# Patient Record
Sex: Male | Born: 1950 | Race: White | Hispanic: No | Marital: Married | Smoking: Never smoker
Health system: Southern US, Community
[De-identification: ages and names within clinical notes are randomized; demographics above are authoritative.]

## PROBLEM LIST (undated history)

## (undated) DIAGNOSIS — G43909 Migraine, unspecified, not intractable, without status migrainosus: Secondary | ICD-10-CM

## (undated) DIAGNOSIS — K226 Gastro-esophageal laceration-hemorrhage syndrome: Secondary | ICD-10-CM

## (undated) DIAGNOSIS — T7840XA Allergy, unspecified, initial encounter: Secondary | ICD-10-CM

## (undated) DIAGNOSIS — J309 Allergic rhinitis, unspecified: Secondary | ICD-10-CM

## (undated) DIAGNOSIS — IMO0001 Reserved for inherently not codable concepts without codable children: Secondary | ICD-10-CM

## (undated) DIAGNOSIS — M797 Fibromyalgia: Secondary | ICD-10-CM

## (undated) DIAGNOSIS — E785 Hyperlipidemia, unspecified: Secondary | ICD-10-CM

## (undated) DIAGNOSIS — G4700 Insomnia, unspecified: Secondary | ICD-10-CM

## (undated) DIAGNOSIS — M329 Systemic lupus erythematosus, unspecified: Secondary | ICD-10-CM

## (undated) DIAGNOSIS — R892 Abnormal level of other drugs, medicaments and biological substances in specimens from other organs, systems and tissues: Secondary | ICD-10-CM

## (undated) DIAGNOSIS — Z86711 Personal history of pulmonary embolism: Secondary | ICD-10-CM

## (undated) DIAGNOSIS — K279 Peptic ulcer, site unspecified, unspecified as acute or chronic, without hemorrhage or perforation: Secondary | ICD-10-CM

## (undated) DIAGNOSIS — M1990 Unspecified osteoarthritis, unspecified site: Secondary | ICD-10-CM

## (undated) DIAGNOSIS — M069 Rheumatoid arthritis, unspecified: Secondary | ICD-10-CM

## (undated) DIAGNOSIS — Z87448 Personal history of other diseases of urinary system: Secondary | ICD-10-CM

## (undated) DIAGNOSIS — E669 Obesity, unspecified: Secondary | ICD-10-CM

## (undated) DIAGNOSIS — D649 Anemia, unspecified: Secondary | ICD-10-CM

## (undated) DIAGNOSIS — I712 Thoracic aortic aneurysm, without rupture: Secondary | ICD-10-CM

## (undated) DIAGNOSIS — F329 Major depressive disorder, single episode, unspecified: Secondary | ICD-10-CM

## (undated) DIAGNOSIS — F411 Generalized anxiety disorder: Secondary | ICD-10-CM

## (undated) DIAGNOSIS — IMO0002 Reserved for concepts with insufficient information to code with codable children: Secondary | ICD-10-CM

## (undated) DIAGNOSIS — I214 Non-ST elevation (NSTEMI) myocardial infarction: Secondary | ICD-10-CM

## (undated) DIAGNOSIS — K219 Gastro-esophageal reflux disease without esophagitis: Secondary | ICD-10-CM

## (undated) DIAGNOSIS — J984 Other disorders of lung: Secondary | ICD-10-CM

## (undated) DIAGNOSIS — Z5189 Encounter for other specified aftercare: Secondary | ICD-10-CM

## (undated) DIAGNOSIS — H269 Unspecified cataract: Secondary | ICD-10-CM

## (undated) DIAGNOSIS — I25110 Atherosclerotic heart disease of native coronary artery with unstable angina pectoris: Secondary | ICD-10-CM

## (undated) DIAGNOSIS — Z955 Presence of coronary angioplasty implant and graft: Secondary | ICD-10-CM

## (undated) DIAGNOSIS — D689 Coagulation defect, unspecified: Secondary | ICD-10-CM

## (undated) DIAGNOSIS — I10 Essential (primary) hypertension: Secondary | ICD-10-CM

## (undated) HISTORY — DX: Other disorders of lung: J98.4

## (undated) HISTORY — DX: Allergy, unspecified, initial encounter: T78.40XA

## (undated) HISTORY — DX: Unspecified cataract: H26.9

## (undated) HISTORY — DX: Essential (primary) hypertension: I10

## (undated) HISTORY — DX: Generalized anxiety disorder: F41.1

## (undated) HISTORY — DX: Allergic rhinitis, unspecified: J30.9

## (undated) HISTORY — PX: UPPER GASTROINTESTINAL ENDOSCOPY: SHX188

## (undated) HISTORY — DX: Hyperlipidemia, unspecified: E78.5

## (undated) HISTORY — DX: Obesity, unspecified: E66.9

## (undated) HISTORY — DX: Major depressive disorder, single episode, unspecified: F32.9

## (undated) HISTORY — DX: Abnormal level of other drugs, medicaments and biological substances in specimens from other organs, systems and tissues: R89.2

## (undated) HISTORY — DX: Personal history of pulmonary embolism: Z86.711

## (undated) HISTORY — DX: Unspecified osteoarthritis, unspecified site: M19.90

## (undated) HISTORY — PX: APPENDECTOMY: SHX54

## (undated) HISTORY — DX: Gastro-esophageal laceration-hemorrhage syndrome: K22.6

## (undated) HISTORY — DX: Anemia, unspecified: D64.9

## (undated) HISTORY — DX: Coagulation defect, unspecified: D68.9

## (undated) HISTORY — DX: Encounter for other specified aftercare: Z51.89

## (undated) HISTORY — DX: Thoracic aortic aneurysm, without rupture: I71.2

## (undated) HISTORY — DX: Reserved for inherently not codable concepts without codable children: IMO0001

## (undated) HISTORY — DX: Personal history of other diseases of urinary system: Z87.448

## (undated) HISTORY — DX: Peptic ulcer, site unspecified, unspecified as acute or chronic, without hemorrhage or perforation: K27.9

## (undated) HISTORY — DX: Insomnia, unspecified: G47.00

## (undated) HISTORY — DX: Fibromyalgia: M79.7

---

## 2004-07-05 ENCOUNTER — Ambulatory Visit: Payer: Self-pay | Admitting: Internal Medicine

## 2004-08-03 ENCOUNTER — Ambulatory Visit: Payer: Self-pay | Admitting: Internal Medicine

## 2005-01-03 ENCOUNTER — Ambulatory Visit: Payer: Self-pay | Admitting: Internal Medicine

## 2005-03-26 ENCOUNTER — Ambulatory Visit: Payer: Self-pay | Admitting: Internal Medicine

## 2005-07-16 ENCOUNTER — Ambulatory Visit: Payer: Self-pay | Admitting: Internal Medicine

## 2005-07-23 ENCOUNTER — Ambulatory Visit: Payer: Self-pay | Admitting: Internal Medicine

## 2005-09-23 ENCOUNTER — Encounter: Admission: RE | Admit: 2005-09-23 | Discharge: 2005-12-22 | Payer: Self-pay | Admitting: Internal Medicine

## 2006-01-28 ENCOUNTER — Ambulatory Visit: Payer: Self-pay | Admitting: Internal Medicine

## 2006-04-22 ENCOUNTER — Ambulatory Visit: Payer: Self-pay | Admitting: Internal Medicine

## 2006-08-15 ENCOUNTER — Ambulatory Visit: Payer: Self-pay | Admitting: Internal Medicine

## 2006-08-15 LAB — CONVERTED CEMR LAB
AST: 43 units/L — ABNORMAL HIGH (ref 0–37)
Alkaline Phosphatase: 61 units/L (ref 39–117)
Basophils Absolute: 0 10*3/uL (ref 0.0–0.1)
Basophils Relative: 0.5 % (ref 0.0–1.0)
Bilirubin Urine: NEGATIVE
CO2: 27 meq/L (ref 19–32)
Cholesterol: 133 mg/dL (ref 0–200)
Creatinine, Ser: 0.9 mg/dL (ref 0.4–1.5)
Glucose, Bld: 95 mg/dL (ref 70–99)
HCT: 42.3 % (ref 39.0–52.0)
HDL: 34.5 mg/dL — ABNORMAL LOW (ref >39.0)
Ketones, ur: NEGATIVE mg/dL
LDL Cholesterol: 86 mg/dL (ref 0–99)
Lymphocytes Relative: 27.8 % (ref 12.0–46.0)
Neutrophils Relative %: 61.4 % (ref 43.0–77.0)
PSA: 0.64 ng/mL
PSA: 0.64 ng/mL (ref 0.10–4.00)
Platelets: 305 10*3/uL (ref 150–400)
Sodium: 141 meq/L (ref 135–145)
Specific Gravity, Urine: 1.02 (ref 1.000–1.03)
Total Protein, Urine: NEGATIVE mg/dL
Total Protein: 6.7 g/dL (ref 6.0–8.3)
Urine Glucose: NEGATIVE mg/dL
Urobilinogen, UA: 0.2 (ref 0.0–1.0)
WBC: 6.2 10*3/uL (ref 4.5–10.5)
pH: 6 (ref 5.0–8.0)

## 2006-08-18 ENCOUNTER — Encounter: Payer: Self-pay | Admitting: Internal Medicine

## 2006-08-18 LAB — CONVERTED CEMR LAB: PSA: 0.64 ng/mL

## 2006-08-19 ENCOUNTER — Ambulatory Visit: Payer: Self-pay | Admitting: Internal Medicine

## 2007-04-27 ENCOUNTER — Encounter: Payer: Self-pay | Admitting: Internal Medicine

## 2007-04-27 DIAGNOSIS — I10 Essential (primary) hypertension: Secondary | ICD-10-CM | POA: Insufficient documentation

## 2007-04-27 DIAGNOSIS — K226 Gastro-esophageal laceration-hemorrhage syndrome: Secondary | ICD-10-CM | POA: Insufficient documentation

## 2007-04-27 DIAGNOSIS — E785 Hyperlipidemia, unspecified: Secondary | ICD-10-CM

## 2007-04-27 HISTORY — DX: Gastro-esophageal laceration-hemorrhage syndrome: K22.6

## 2007-04-27 HISTORY — DX: Essential (primary) hypertension: I10

## 2007-04-27 HISTORY — DX: Hyperlipidemia, unspecified: E78.5

## 2007-04-30 ENCOUNTER — Encounter: Payer: Self-pay | Admitting: Internal Medicine

## 2007-04-30 DIAGNOSIS — J309 Allergic rhinitis, unspecified: Secondary | ICD-10-CM

## 2007-04-30 DIAGNOSIS — E669 Obesity, unspecified: Secondary | ICD-10-CM

## 2007-04-30 DIAGNOSIS — M1990 Unspecified osteoarthritis, unspecified site: Secondary | ICD-10-CM | POA: Insufficient documentation

## 2007-04-30 DIAGNOSIS — K219 Gastro-esophageal reflux disease without esophagitis: Secondary | ICD-10-CM | POA: Insufficient documentation

## 2007-04-30 DIAGNOSIS — Z87448 Personal history of other diseases of urinary system: Secondary | ICD-10-CM

## 2007-04-30 HISTORY — DX: Unspecified osteoarthritis, unspecified site: M19.90

## 2007-04-30 HISTORY — DX: Personal history of other diseases of urinary system: Z87.448

## 2007-04-30 HISTORY — DX: Allergic rhinitis, unspecified: J30.9

## 2007-04-30 HISTORY — DX: Obesity, unspecified: E66.9

## 2007-07-10 ENCOUNTER — Ambulatory Visit: Payer: Self-pay | Admitting: Internal Medicine

## 2007-07-12 DIAGNOSIS — K279 Peptic ulcer, site unspecified, unspecified as acute or chronic, without hemorrhage or perforation: Secondary | ICD-10-CM | POA: Insufficient documentation

## 2007-07-12 DIAGNOSIS — D649 Anemia, unspecified: Secondary | ICD-10-CM

## 2007-07-12 HISTORY — DX: Anemia, unspecified: D64.9

## 2007-07-12 HISTORY — DX: Peptic ulcer, site unspecified, unspecified as acute or chronic, without hemorrhage or perforation: K27.9

## 2007-10-12 ENCOUNTER — Encounter: Payer: Self-pay | Admitting: Internal Medicine

## 2007-10-14 ENCOUNTER — Encounter: Payer: Self-pay | Admitting: Internal Medicine

## 2007-11-14 ENCOUNTER — Ambulatory Visit: Payer: Self-pay | Admitting: Family Medicine

## 2007-11-14 DIAGNOSIS — H6690 Otitis media, unspecified, unspecified ear: Secondary | ICD-10-CM | POA: Insufficient documentation

## 2007-12-05 ENCOUNTER — Ambulatory Visit: Payer: Self-pay | Admitting: Internal Medicine

## 2007-12-05 DIAGNOSIS — M2550 Pain in unspecified joint: Secondary | ICD-10-CM | POA: Insufficient documentation

## 2007-12-07 ENCOUNTER — Ambulatory Visit: Payer: Self-pay | Admitting: Internal Medicine

## 2007-12-07 DIAGNOSIS — M130 Polyarthritis, unspecified: Secondary | ICD-10-CM | POA: Insufficient documentation

## 2007-12-07 LAB — CONVERTED CEMR LAB: Sed Rate: 70 mm/hr — ABNORMAL HIGH (ref 0–16)

## 2008-01-20 ENCOUNTER — Encounter: Payer: Self-pay | Admitting: Internal Medicine

## 2008-02-02 ENCOUNTER — Encounter: Payer: Self-pay | Admitting: Internal Medicine

## 2008-03-01 ENCOUNTER — Encounter: Payer: Self-pay | Admitting: Internal Medicine

## 2008-03-01 ENCOUNTER — Ambulatory Visit: Payer: Self-pay | Admitting: Internal Medicine

## 2008-03-01 ENCOUNTER — Inpatient Hospital Stay (HOSPITAL_COMMUNITY): Admission: EM | Admit: 2008-03-01 | Discharge: 2008-03-03 | Payer: Self-pay | Admitting: Emergency Medicine

## 2008-03-04 ENCOUNTER — Encounter: Payer: Self-pay | Admitting: Internal Medicine

## 2008-03-09 ENCOUNTER — Ambulatory Visit: Payer: Self-pay | Admitting: Internal Medicine

## 2008-03-09 DIAGNOSIS — I2699 Other pulmonary embolism without acute cor pulmonale: Secondary | ICD-10-CM | POA: Insufficient documentation

## 2008-03-09 DIAGNOSIS — Z86711 Personal history of pulmonary embolism: Secondary | ICD-10-CM

## 2008-03-09 HISTORY — DX: Personal history of pulmonary embolism: Z86.711

## 2008-03-12 ENCOUNTER — Telehealth (INDEPENDENT_AMBULATORY_CARE_PROVIDER_SITE_OTHER): Payer: Self-pay | Admitting: Acute Care

## 2008-03-14 ENCOUNTER — Ambulatory Visit: Payer: Self-pay

## 2008-03-14 ENCOUNTER — Ambulatory Visit: Payer: Self-pay | Admitting: Cardiovascular Disease

## 2008-03-14 ENCOUNTER — Encounter: Payer: Self-pay | Admitting: Cardiovascular Disease

## 2008-03-14 ENCOUNTER — Ambulatory Visit: Payer: Self-pay | Admitting: Critical Care Medicine

## 2008-03-14 ENCOUNTER — Ambulatory Visit: Payer: Self-pay | Admitting: Cardiology

## 2008-03-14 DIAGNOSIS — R892 Abnormal level of other drugs, medicaments and biological substances in specimens from other organs, systems and tissues: Secondary | ICD-10-CM | POA: Insufficient documentation

## 2008-03-14 DIAGNOSIS — R000 Tachycardia, unspecified: Secondary | ICD-10-CM | POA: Insufficient documentation

## 2008-03-14 HISTORY — DX: Abnormal level of other drugs, medicaments and biological substances in specimens from other organs, systems and tissues: R89.2

## 2008-03-23 ENCOUNTER — Ambulatory Visit: Payer: Self-pay

## 2008-03-23 ENCOUNTER — Encounter: Payer: Self-pay | Admitting: Cardiovascular Disease

## 2008-03-31 ENCOUNTER — Ambulatory Visit: Payer: Self-pay | Admitting: Cardiovascular Disease

## 2008-03-31 LAB — CONVERTED CEMR LAB
Basophils Absolute: 0 10*3/uL (ref 0.0–0.1)
Basophils Relative: 0.1 % (ref 0.0–3.0)
Eosinophils Relative: 0.6 % (ref 0.0–5.0)
Hemoglobin: 11.8 g/dL — ABNORMAL LOW (ref 13.0–17.0)
Lymphocytes Relative: 11.3 % — ABNORMAL LOW (ref 12.0–46.0)
MCHC: 33.9 g/dL (ref 30.0–36.0)
Monocytes Relative: 3.9 % (ref 3.0–12.0)
Neutro Abs: 8.1 10*3/uL — ABNORMAL HIGH (ref 1.4–7.7)
Neutrophils Relative %: 84.1 % — ABNORMAL HIGH (ref 43.0–77.0)
RBC: 4.13 M/uL — ABNORMAL LOW (ref 4.22–5.81)
WBC: 9.7 10*3/uL (ref 4.5–10.5)

## 2008-05-05 ENCOUNTER — Telehealth: Payer: Self-pay | Admitting: Internal Medicine

## 2008-05-25 ENCOUNTER — Ambulatory Visit: Payer: Self-pay | Admitting: Cardiology

## 2008-05-25 LAB — CONVERTED CEMR LAB
Calcium: 8.8 mg/dL (ref 8.4–10.5)
Eosinophils Absolute: 0 10*3/uL (ref 0.0–0.7)
GFR calc Af Amer: 129 mL/min
GFR calc non Af Amer: 107 mL/min
Glucose, Bld: 93 mg/dL (ref 70–99)
HCT: 33.8 % — ABNORMAL LOW (ref 39.0–52.0)
Hemoglobin: 11.4 g/dL — ABNORMAL LOW (ref 13.0–17.0)
MCHC: 33.8 g/dL (ref 30.0–36.0)
MCV: 81.5 fL (ref 78.0–100.0)
Monocytes Absolute: 0.1 10*3/uL (ref 0.1–1.0)
Monocytes Relative: 1.7 % — ABNORMAL LOW (ref 3.0–12.0)
Neutro Abs: 6 10*3/uL (ref 1.4–7.7)
Platelets: 450 10*3/uL — ABNORMAL HIGH (ref 150–400)
Potassium: 4.1 meq/L (ref 3.5–5.1)
Pro B Natriuretic peptide (BNP): 99 pg/mL (ref 0.0–100.0)
RDW: 14.2 % (ref 11.5–14.6)
Sodium: 139 meq/L (ref 135–145)

## 2008-05-26 ENCOUNTER — Ambulatory Visit: Payer: Self-pay | Admitting: Cardiology

## 2008-05-26 ENCOUNTER — Ambulatory Visit: Payer: Self-pay | Admitting: Cardiovascular Disease

## 2008-05-26 ENCOUNTER — Ambulatory Visit: Payer: Self-pay

## 2008-05-26 ENCOUNTER — Encounter: Payer: Self-pay | Admitting: Cardiology

## 2008-05-31 ENCOUNTER — Ambulatory Visit: Payer: Self-pay | Admitting: Cardiovascular Disease

## 2008-05-31 LAB — CONVERTED CEMR LAB
Basophils Absolute: 0 10*3/uL (ref 0.0–0.1)
Basophils Relative: 0.2 % (ref 0.0–3.0)
Calcium: 8.5 mg/dL (ref 8.4–10.5)
Creatinine, Ser: 0.8 mg/dL (ref 0.4–1.5)
Eosinophils Absolute: 0.1 10*3/uL (ref 0.0–0.7)
GFR calc Af Amer: 129 mL/min
GFR calc non Af Amer: 107 mL/min
Hemoglobin: 11.4 g/dL — ABNORMAL LOW (ref 13.0–17.0)
MCHC: 33.5 g/dL (ref 30.0–36.0)
MCV: 81.6 fL (ref 78.0–100.0)
Neutro Abs: 5.6 10*3/uL (ref 1.4–7.7)
RBC: 4.15 M/uL — ABNORMAL LOW (ref 4.22–5.81)
RDW: 14 % (ref 11.5–14.6)
Sodium: 138 meq/L (ref 135–145)

## 2008-06-05 HISTORY — PX: CARDIAC CATHETERIZATION: SHX172

## 2008-06-09 ENCOUNTER — Ambulatory Visit: Payer: Self-pay | Admitting: Cardiovascular Disease

## 2008-06-09 ENCOUNTER — Inpatient Hospital Stay (HOSPITAL_BASED_OUTPATIENT_CLINIC_OR_DEPARTMENT_OTHER): Admission: RE | Admit: 2008-06-09 | Discharge: 2008-06-09 | Payer: Self-pay | Admitting: Cardiovascular Disease

## 2008-06-14 ENCOUNTER — Telehealth (INDEPENDENT_AMBULATORY_CARE_PROVIDER_SITE_OTHER): Payer: Self-pay | Admitting: *Deleted

## 2008-06-15 ENCOUNTER — Telehealth (INDEPENDENT_AMBULATORY_CARE_PROVIDER_SITE_OTHER): Payer: Self-pay | Admitting: *Deleted

## 2008-06-20 ENCOUNTER — Ambulatory Visit: Payer: Self-pay | Admitting: Cardiology

## 2008-06-23 ENCOUNTER — Encounter: Payer: Self-pay | Admitting: Internal Medicine

## 2008-06-27 ENCOUNTER — Ambulatory Visit: Payer: Self-pay | Admitting: Internal Medicine

## 2008-07-04 ENCOUNTER — Ambulatory Visit: Payer: Self-pay | Admitting: Cardiovascular Disease

## 2008-07-04 LAB — CONVERTED CEMR LAB
ALT: 33 units/L (ref 0–53)
AST: 28 units/L (ref 0–37)
Alkaline Phosphatase: 49 units/L (ref 39–117)
Bilirubin, Direct: 0.1 mg/dL (ref 0.0–0.3)
Cholesterol: 95 mg/dL (ref 0–200)
Total Protein: 7.2 g/dL (ref 6.0–8.3)

## 2008-07-07 ENCOUNTER — Ambulatory Visit: Payer: Self-pay | Admitting: Cardiology

## 2008-07-27 ENCOUNTER — Ambulatory Visit: Payer: Self-pay | Admitting: Cardiovascular Disease

## 2008-08-02 ENCOUNTER — Encounter: Payer: Self-pay | Admitting: Internal Medicine

## 2008-08-05 HISTORY — PX: COLONOSCOPY: SHX174

## 2008-08-09 ENCOUNTER — Ambulatory Visit: Payer: Self-pay | Admitting: Internal Medicine

## 2008-08-09 DIAGNOSIS — R930 Abnormal findings on diagnostic imaging of skull and head, not elsewhere classified: Secondary | ICD-10-CM | POA: Insufficient documentation

## 2008-09-27 ENCOUNTER — Encounter: Payer: Self-pay | Admitting: Internal Medicine

## 2008-09-28 ENCOUNTER — Telehealth: Payer: Self-pay | Admitting: Internal Medicine

## 2008-09-28 DIAGNOSIS — R0602 Shortness of breath: Secondary | ICD-10-CM | POA: Insufficient documentation

## 2008-09-29 ENCOUNTER — Ambulatory Visit: Payer: Self-pay | Admitting: Internal Medicine

## 2009-01-04 ENCOUNTER — Encounter: Payer: Self-pay | Admitting: *Deleted

## 2009-01-05 ENCOUNTER — Telehealth: Payer: Self-pay | Admitting: Internal Medicine

## 2009-01-27 ENCOUNTER — Encounter: Payer: Self-pay | Admitting: Internal Medicine

## 2009-02-08 ENCOUNTER — Encounter: Payer: Self-pay | Admitting: *Deleted

## 2009-02-14 ENCOUNTER — Telehealth (INDEPENDENT_AMBULATORY_CARE_PROVIDER_SITE_OTHER): Payer: Self-pay | Admitting: *Deleted

## 2009-02-14 ENCOUNTER — Ambulatory Visit: Payer: Self-pay | Admitting: Internal Medicine

## 2009-02-14 DIAGNOSIS — F3289 Other specified depressive episodes: Secondary | ICD-10-CM

## 2009-02-14 DIAGNOSIS — F329 Major depressive disorder, single episode, unspecified: Secondary | ICD-10-CM | POA: Insufficient documentation

## 2009-02-14 DIAGNOSIS — M6080 Other myositis, unspecified site: Secondary | ICD-10-CM | POA: Insufficient documentation

## 2009-02-14 DIAGNOSIS — IMO0001 Reserved for inherently not codable concepts without codable children: Secondary | ICD-10-CM | POA: Insufficient documentation

## 2009-02-14 HISTORY — DX: Reserved for inherently not codable concepts without codable children: IMO0001

## 2009-02-14 HISTORY — DX: Major depressive disorder, single episode, unspecified: F32.9

## 2009-02-14 HISTORY — DX: Other specified depressive episodes: F32.89

## 2009-02-14 LAB — CONVERTED CEMR LAB
AST: 29 units/L (ref 0–37)
Albumin: 3.1 g/dL — ABNORMAL LOW (ref 3.5–5.2)
Alkaline Phosphatase: 46 units/L (ref 39–117)
BUN: 17 mg/dL (ref 6–23)
Basophils Relative: 0.2 % (ref 0.0–3.0)
Bilirubin Urine: NEGATIVE
Calcium: 8.7 mg/dL (ref 8.4–10.5)
Chloride: 104 meq/L (ref 96–112)
Cholesterol: 99 mg/dL (ref 0–200)
Creatinine, Ser: 0.7 mg/dL (ref 0.4–1.5)
Eosinophils Absolute: 0.1 10*3/uL (ref 0.0–0.7)
GFR calc non Af Amer: 124.02 mL/min (ref >60)
Hemoglobin, Urine: NEGATIVE
Hemoglobin: 13 g/dL (ref 13.0–17.0)
Ketones, ur: NEGATIVE mg/dL
LDL Cholesterol: 50 mg/dL (ref 0–99)
Lymphocytes Relative: 31.8 % (ref 12.0–46.0)
MCHC: 33.5 g/dL (ref 30.0–36.0)
Neutro Abs: 2.7 10*3/uL (ref 1.4–7.7)
RBC: 4.43 M/uL (ref 4.22–5.81)
Total Protein, Urine: NEGATIVE mg/dL
Total Protein: 6.7 g/dL (ref 6.0–8.3)
Urine Glucose: NEGATIVE mg/dL

## 2009-02-17 ENCOUNTER — Ambulatory Visit: Payer: Self-pay | Admitting: Internal Medicine

## 2009-02-17 DIAGNOSIS — F411 Generalized anxiety disorder: Secondary | ICD-10-CM | POA: Insufficient documentation

## 2009-02-17 DIAGNOSIS — M069 Rheumatoid arthritis, unspecified: Secondary | ICD-10-CM | POA: Insufficient documentation

## 2009-02-17 HISTORY — DX: Generalized anxiety disorder: F41.1

## 2009-02-28 ENCOUNTER — Telehealth (INDEPENDENT_AMBULATORY_CARE_PROVIDER_SITE_OTHER): Payer: Self-pay | Admitting: *Deleted

## 2009-03-17 ENCOUNTER — Encounter: Payer: Self-pay | Admitting: Cardiology

## 2009-03-20 ENCOUNTER — Encounter (INDEPENDENT_AMBULATORY_CARE_PROVIDER_SITE_OTHER): Payer: Self-pay | Admitting: *Deleted

## 2009-03-23 ENCOUNTER — Encounter: Payer: Self-pay | Admitting: Internal Medicine

## 2009-03-24 ENCOUNTER — Ambulatory Visit: Payer: Self-pay | Admitting: Internal Medicine

## 2009-04-14 ENCOUNTER — Ambulatory Visit: Payer: Self-pay | Admitting: Internal Medicine

## 2009-04-14 ENCOUNTER — Encounter: Payer: Self-pay | Admitting: Internal Medicine

## 2009-04-14 HISTORY — PX: POLYPECTOMY: SHX149

## 2009-04-17 ENCOUNTER — Encounter: Payer: Self-pay | Admitting: Internal Medicine

## 2009-05-30 ENCOUNTER — Telehealth: Payer: Self-pay | Admitting: Internal Medicine

## 2009-06-02 ENCOUNTER — Ambulatory Visit: Payer: Self-pay | Admitting: Internal Medicine

## 2009-06-02 DIAGNOSIS — R079 Chest pain, unspecified: Secondary | ICD-10-CM | POA: Insufficient documentation

## 2009-06-05 HISTORY — PX: NM MYOVIEW LTD: HXRAD82

## 2009-06-05 HISTORY — PX: TRANSTHORACIC ECHOCARDIOGRAM: SHX275

## 2009-06-12 ENCOUNTER — Telehealth (INDEPENDENT_AMBULATORY_CARE_PROVIDER_SITE_OTHER): Payer: Self-pay | Admitting: *Deleted

## 2009-06-13 ENCOUNTER — Encounter (HOSPITAL_COMMUNITY): Admission: RE | Admit: 2009-06-13 | Discharge: 2009-08-03 | Payer: Self-pay | Admitting: Internal Medicine

## 2009-06-13 ENCOUNTER — Encounter: Payer: Self-pay | Admitting: Internal Medicine

## 2009-06-13 ENCOUNTER — Ambulatory Visit: Payer: Self-pay

## 2009-06-13 ENCOUNTER — Ambulatory Visit: Payer: Self-pay | Admitting: Cardiology

## 2009-06-13 ENCOUNTER — Ambulatory Visit (HOSPITAL_COMMUNITY): Admission: RE | Admit: 2009-06-13 | Discharge: 2009-06-13 | Payer: Self-pay | Admitting: Internal Medicine

## 2009-06-13 DIAGNOSIS — R9439 Abnormal result of other cardiovascular function study: Secondary | ICD-10-CM | POA: Insufficient documentation

## 2009-06-14 ENCOUNTER — Telehealth (INDEPENDENT_AMBULATORY_CARE_PROVIDER_SITE_OTHER): Payer: Self-pay | Admitting: *Deleted

## 2009-06-15 ENCOUNTER — Ambulatory Visit: Payer: Self-pay | Admitting: Internal Medicine

## 2009-06-15 ENCOUNTER — Encounter: Payer: Self-pay | Admitting: Internal Medicine

## 2009-06-20 ENCOUNTER — Ambulatory Visit: Payer: Self-pay | Admitting: Critical Care Medicine

## 2009-06-20 DIAGNOSIS — J984 Other disorders of lung: Secondary | ICD-10-CM

## 2009-06-20 HISTORY — DX: Other disorders of lung: J98.4

## 2009-06-21 ENCOUNTER — Ambulatory Visit: Payer: Self-pay | Admitting: Cardiovascular Disease

## 2009-07-06 ENCOUNTER — Ambulatory Visit: Payer: Self-pay | Admitting: Cardiovascular Disease

## 2009-07-06 DIAGNOSIS — I712 Thoracic aortic aneurysm, without rupture, unspecified: Secondary | ICD-10-CM | POA: Insufficient documentation

## 2009-07-06 HISTORY — DX: Thoracic aortic aneurysm, without rupture, unspecified: I71.20

## 2009-07-06 HISTORY — DX: Thoracic aortic aneurysm, without rupture: I71.2

## 2010-02-20 ENCOUNTER — Ambulatory Visit: Payer: Self-pay | Admitting: Internal Medicine

## 2010-02-20 DIAGNOSIS — G4700 Insomnia, unspecified: Secondary | ICD-10-CM

## 2010-02-20 HISTORY — DX: Insomnia, unspecified: G47.00

## 2010-02-20 LAB — CONVERTED CEMR LAB
Albumin: 3.9 g/dL (ref 3.5–5.2)
Alkaline Phosphatase: 69 units/L (ref 39–117)
Basophils Absolute: 0.1 10*3/uL (ref 0.0–0.1)
Bilirubin, Direct: 0.1 mg/dL (ref 0.0–0.3)
CO2: 29 meq/L (ref 19–32)
Calcium: 9.3 mg/dL (ref 8.4–10.5)
Creatinine, Ser: 0.8 mg/dL (ref 0.4–1.5)
Eosinophils Absolute: 0.1 10*3/uL (ref 0.0–0.7)
Glucose, Bld: 93 mg/dL (ref 70–99)
HDL: 32.8 mg/dL — ABNORMAL LOW (ref >39.00)
Lymphocytes Relative: 18.3 % (ref 12.0–46.0)
MCHC: 33.3 g/dL (ref 30.0–36.0)
Neutro Abs: 6.9 10*3/uL (ref 1.4–7.7)
Neutrophils Relative %: 71.3 % (ref 43.0–77.0)
RDW: 14 % (ref 11.5–14.6)
Specific Gravity, Urine: >=1.03 (ref 1.000–1.030)
TSH: 0.77 microintl units/mL (ref 0.35–5.50)
Total Protein, Urine: NEGATIVE mg/dL
Triglycerides: 157 mg/dL — ABNORMAL HIGH (ref 0.0–149.0)
Urine Glucose: NEGATIVE mg/dL
Urobilinogen, UA: 1 (ref 0.0–1.0)
VLDL: 31.4 mg/dL (ref 0.0–40.0)

## 2010-06-19 ENCOUNTER — Encounter: Payer: Self-pay | Admitting: Internal Medicine

## 2010-09-02 LAB — CONVERTED CEMR LAB
ALT: 37 units/L (ref 0–53)
Albumin: 2.9 g/dL — ABNORMAL LOW (ref 3.5–5.2)
Basophils Relative: 0 % (ref 0.0–3.0)
Basophils Relative: 0.6 % (ref 0.0–1.0)
Bilirubin Urine: NEGATIVE
Bilirubin, Direct: 0.1 mg/dL (ref 0.0–0.3)
Bilirubin, Direct: 0.3 mg/dL (ref 0.0–0.3)
CO2: 27 meq/L (ref 19–32)
CO2: 28 meq/L (ref 19–32)
Calcium: 8.9 mg/dL (ref 8.4–10.5)
Creatinine, Ser: 0.9 mg/dL (ref 0.4–1.5)
Creatinine, Ser: 0.9 mg/dL (ref 0.4–1.5)
Eosinophils Relative: 2.5 % (ref 0.0–5.0)
GFR calc Af Amer: 113 mL/min
GFR calc Af Amer: 113 mL/min
Glucose, Bld: 108 mg/dL — ABNORMAL HIGH (ref 70–99)
Glucose, Bld: 119 mg/dL — ABNORMAL HIGH (ref 70–99)
HCT: 39.9 % (ref 39.0–52.0)
HCT: 42.9 % (ref 39.0–52.0)
Hemoglobin: 13.5 g/dL (ref 13.0–17.0)
Hemoglobin: 15.2 g/dL (ref 13.0–17.0)
Leukocytes, UA: NEGATIVE
Lymphocytes Relative: 28.3 % (ref 12.0–46.0)
MCHC: 33.8 g/dL (ref 30.0–36.0)
Monocytes Absolute: 0.3 10*3/uL (ref 0.1–1.0)
Monocytes Absolute: 0.7 10*3/uL (ref 0.2–0.7)
Monocytes Relative: 3.2 % (ref 3.0–12.0)
Monocytes Relative: 8.8 % (ref 3.0–11.0)
Neutro Abs: 4.7 10*3/uL (ref 1.4–7.7)
Neutro Abs: 7.3 10*3/uL (ref 1.4–7.7)
Neutrophils Relative %: 59.8 % (ref 43.0–77.0)
Potassium: 3.9 meq/L (ref 3.5–5.1)
RBC: 4.6 M/uL (ref 4.22–5.81)
RDW: 12.7 % (ref 11.5–14.6)
Sodium: 137 meq/L (ref 135–145)
Sodium: 139 meq/L (ref 135–145)
Specific Gravity, Urine: 1.025 (ref 1.000–1.03)
TSH: 1.68 microintl units/mL (ref 0.35–5.50)
Total Bilirubin: 0.6 mg/dL (ref 0.3–1.2)
Total Protein, Urine: NEGATIVE mg/dL
Total Protein: 6.9 g/dL (ref 6.0–8.3)
Total Protein: 7 g/dL (ref 6.0–8.3)
Triglycerides: 122 mg/dL (ref 0–149)
Urobilinogen, UA: 1 (ref 0.0–1.0)
WBC: 7.8 10*3/uL (ref 4.5–10.5)

## 2010-09-04 NOTE — Letter (Signed)
Summary: [facility]   Imported By: Sherian Rein 06/25/2010 11:14:20  _____________________________________________________________________  External Attachment:    Type:   Image     Comment:   External Document

## 2010-09-04 NOTE — Assessment & Plan Note (Signed)
Summary: PROBLEMS SLEEPING/NWS   Vital Signs:  Patient profile:   60 year old male Height:      72 inches Weight:      218.50 pounds BMI:     29.74 O2 Sat:      94 % on Room air Temp:     97.8 degrees F oral Pulse rate:   93 / minute BP sitting:   92 / 68  (left arm) Cuff size:   large  Vitals Entered By: Zella Ball Ewing CMA Duncan Dull) (February 20, 2010 3:23 PM)  O2 Flow:  Room air  CC: Sleeping and anxiety problems/RE   Primary Care Provider:  Oliver Barre  CC:  Sleeping and anxiety problems/RE.  History of Present Illness: here with 2 wks other unexplained anxiety increase with near panic and much more difficulty getting to sleep - only gets a few hours per night;  denies worsening depression or suicidal ideation;  RA doing better on current meds so pain is much better and not as much of an issue;  Gets diarrhea every 3 to 4 days, but no abd pain, n/v, wt loss, blood.  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  no other recent med changes.  Pt denies CP, sob, doe, wheezing, orthopnea, pnd, worsening LE edema, palps, dizziness or syncope  Pt denies new neuro symptoms such as headache, facial or extremity weakness Has klonopin more lately - takes about 35 to 45 min to start working.  had to open the windows going to work due to claustrophobia.    Preventive Screening-Counseling & Management      Drug Use:  no.    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) 14)  Anxiety  (ICD-300.00) 15)  Preventive Health Care  (ICD-V70.0) 16)  Depressive Disorder  (ICD-311) 17)  Unspecified Myalgia and Myositis  (ICD-729.1) 18)  Nonspecific Abnormal Toxicological Findings  (ICD-796.0) 19)  Polyarthritis, 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) 20)  Arthralgia  (ICD-719.40) 21)  Otitis Media, Left  (ICD-382.9) 22)  Preventive Health Care  (ICD-V70.0) 23)  Anemia-nos  (ICD-285.9) 24)  Family History Diabetes 1st Degree Relative  (ICD-V18.0) 25)  Family History of Alcoholism/addiction  (ICD-V61.41) 26)  Peptic Ulcer Disease  (ICD-533.90) 27)  Observation For Suspected Malignant Neoplasm  (ICD-V71.1) 28)  Routine General Medical Exam@health  Care Facl  (ICD-V70.0) 29)  Osteoarthritis  (ICD-715.90) 30)  Gerd  (ICD-530.81) 31)  Allergic Rhinitis  (ICD-477.9) 32)  Prostatitis, Hx of  (ICD-V13.09) 33)  Obesity  (ICD-278.00) 34)  Admsn For Blood Transfusion, No Diagnosis  (ICD-V58.2) 35)  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 Take 1 Tablet By Mouth At Bedtime As Needed Sleep 2)  Ecotrin Low Strength 81 Mg  Tbec (Aspirin) .Marland Kitchen.. 1 By Mouth Qd 3)  Medrol 4 Mg  Tabs (Methylprednisolone) .... 2 By Mouth Daily 4)  Omeprazole 20  Mg Cpdr (Omeprazole) .Marland Kitchen.. 1 By Mouth Once Daily 5)  Losartan Potassium 25 Mg Tabs (Losartan Potassium) .Marland Kitchen.. 1 By Mouth Once Daily 6)  Metoprolol Tartrate 25 Mg Tabs (Metoprolol Tartrate) .... 2 By Mouth Daily 7)  Leflunomide 20 Mg Tabs (Leflunomide) .... Take 1 Tablet By Mouth Once A Day 8)  Folic Acid 800 Mcg Tabs (Folic Acid) .... 2 Per Day 9)  Fish Oil 1200 Mg Caps (Omega-3 Fatty Acids) .Marland Kitchen.. 1 By Mouth Daily 10)  Osteo Bi-Flex Adv Triple St  Tabs (Misc Natural Products) .... 2 By Mouth Daily 11)  Klonopin 1 Mg Tabs (Clonazepam) .... One By Mouth Two Times A Day As Needed Anxiety -  Please  Make Return Office Visit For Further Refills 12)  Lyrica 75 Mg Caps (Pregabalin) .... One By Mouth Two Times A Day For Muscle Aches As Needed 13)  Furosemide 20 Mg Tabs (Furosemide) .Marland Kitchen.. 1 By Mouth Once Daily 14)  Mucinex Dm 30-600 Mg Xr12h-Tab (Dextromethorphan-Guaifenesin) .... As Needed 15)  Imodium A-D 2 Mg Tabs (Loperamide Hcl) .... As Needed 16)  Tussin Dm 100-10 Mg/62ml Syrp (Dextromethorphan-Guaifenesin) .... As Needed  Current Medications (verified): 1)  Zolpidem Tartrate 12.5 Mg Cr-Tabs (Zolpidem Tartrate) .Marland Kitchen.. 1po At Bedtime As Needed 2)  Ecotrin Low Strength 81 Mg  Tbec (Aspirin) .Marland Kitchen.. 1 By Mouth Qd 3)  Medrol 4 Mg  Tabs (Methylprednisolone) .... 2 By Mouth Daily 4)  Omeprazole 20 Mg Cpdr (Omeprazole) .Marland Kitchen.. 1 By Mouth Once Daily 5)  Losartan Potassium 25 Mg Tabs (Losartan Potassium) .Marland Kitchen.. 1 By Mouth Once Daily 6)  Metoprolol Tartrate 25 Mg Tabs (Metoprolol Tartrate) .... 2 By Mouth Daily 7)  Leflunomide 20 Mg Tabs (Leflunomide) .... Take 1 Tablet By Mouth Once A Day 8)  Folic Acid 800 Mcg Tabs (Folic Acid) .... 2 Per Day 9)  Fish Oil 1200 Mg Caps (Omega-3 Fatty Acids) .Marland Kitchen.. 1 By Mouth Daily 10)  Osteo Bi-Flex Adv Triple St  Tabs (Misc Natural Products) .... 2 By Mouth Daily 11)  Klonopin 1 Mg Tabs (Clonazepam) .... One By Mouth Two Times A Day As Needed Anxiety 12)  Lyrica 75 Mg Caps (Pregabalin) .... One By Mouth Two Times A Day For Muscle Aches As Needed 13)  Furosemide 20 Mg Tabs (Furosemide) .Marland Kitchen.. 1 By Mouth Once Daily 14)  Mucinex Dm 30-600 Mg Xr12h-Tab (Dextromethorphan-Guaifenesin) .... As Needed 15)  Imodium A-D 2 Mg Tabs (Loperamide Hcl) .... As Needed 16)  Tussin Dm 100-10 Mg/49ml Syrp (Dextromethorphan-Guaifenesin) .... As Needed 17)  Hydroxychloroquine Sulfate 200 Mg Tabs (Hydroxychloroquine Sulfate) .Marland Kitchen.. 1 By Mouth Two Times A Day 18)  Fluoxetine Hcl 20 Mg Caps (Fluoxetine Hcl) .Marland Kitchen.. 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 Drug use-no  Risk Factors: Alcohol Use: 0 (02/14/2009)  Risk Factors: Smoking Status: never (06/22/2009)  Past Medical History: MYOCARDIAL PERFUSION SCAN, WITH STRESS TEST, ABNORMAL (ICD-794.39) CHEST PAIN (ICD-786.50) PE (ICD-415.19) TACHYCARDIA (ICD-785.0) HYPERLIPIDEMIA (ICD-272.4) HYPERTENSION (ICD-401.9) ANTICOAGULATION THERAPY (ICD-V58.61) DYSPNEA (ICD-786.05) CHEST XRAY, ABNORMAL (ICD-793.1) RESTRICTIVE LUNG DISEASE (ICD-518.89) ARTHRITIS, RHEUMATOID (ICD-714.0) ANXIETY (ICD-300.00) PREVENTIVE HEALTH CARE (ICD-V70.0) DEPRESSIVE DISORDER (ICD-311) UNSPECIFIED MYALGIA AND MYOSITIS (ICD-729.1) NONSPECIFIC ABNORMAL TOXICOLOGICAL FINDINGS (ICD-796.0) POLYARTHRITIS, INFLAMMATORY (ICD-716.59) ARTHRALGIA (ICD-719.40)  OTITIS MEDIA, LEFT (ICD-382.9) PREVENTIVE HEALTH CARE (ICD-V70.0) ANEMIA-NOS (ICD-285.9) FAMILY HISTORY DIABETES 1ST DEGREE RELATIVE (ICD-V18.0) FAMILY HISTORY OF ALCOHOLISM/ADDICTION (ICD-V61.41) PEPTIC ULCER DISEASE (ICD-533.90) OBSERVATION FOR SUSPECTED MALIGNANT NEOPLASM (ICD-V71.1) ROUTINE GENERAL MEDICAL EXAM@HEALTH  CARE FACL (ICD-V70.0) OSTEOARTHRITIS (ICD-715.90) GERD (ICD-530.81) ALLERGIC RHINITIS (ICD-477.9) PROSTATITIS, HX OF (ICD-V13.09) OBESITY (ICD-278.00) ADMSN FOR BLOOD TRANSFUSION, NO DIAGNOSIS (ICD-V58.2) MALLORY-WEISS SYNDROME (ICD-530.7) Non-obstructive CAD   Anxiety  Social History: Reviewed history from 02/14/2009 and no changes required. 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         all otherwise negative per pt -    Physical Exam  General:  alert and overweight-appearing.   Head:  normocephalic and atraumatic.   Eyes:  vision grossly intact, pupils equal, and pupils round.   Ears:  R ear normal and L ear normal.   Nose:  no external deformity and no nasal discharge.   Mouth:  no gingival abnormalities and pharynx pink and moist.   Neck:  supple and no masses.   Lungs:  normal respiratory effort and normal breath sounds.   Heart:  normal rate and regular rhythm.   Abdomen:  soft, non-tender, and normal bowel sounds.   Msk:  no joint tenderness and no joint swelling.   Extremities:  no edema, no erythema  Neurologic:  cranial nerves II-XII intact and strength normal in all extremities.   Skin:  color normal and no rashes.   Psych:  not depressed appearing and moderately anxious.     Impression & Recommendations:  Problem # 1:  Preventive Health Care (ICD-V70.0)  Overall doing well, age appropriate education and counseling updated and referral for appropriate preventive services done unless declined, immunizations up to date or declined, diet counseling done if overweight, urged to quit smoking if smokes , most recent labs reviewed and current ordered if appropriate, ecg reviewed or declined (interpretation per ECG scanned in the EMR if done); information regarding Medicare Prevention requirements given if appropriate; speciality referrals updated as appropriate   Orders: TLB-BMP (Basic Metabolic Panel-BMET) (80048-METABOL) TLB-CBC Platelet -  w/Differential (85025-CBCD) TLB-Hepatic/Liver Function Pnl (80076-HEPATIC) TLB-Lipid Panel (80061-LIPID) TLB-TSH (Thyroid Stimulating Hormone) (84443-TSH) TLB-PSA (Prostate Specific Antigen) (84153-PSA) TLB-Udip ONLY (81003-UDIP)  Problem # 2:  ANXIETY (ICD-300.00)  His updated medication list for this problem includes:    Klonopin 1 Mg Tabs (Clonazepam) ..... One by mouth two times a day as needed anxiety    Fluoxetine Hcl 20 Mg Caps (Fluoxetine hcl) .Marland Kitchen... 1po once daily to refill the klonopin, add prozac 20- once daily , declines counseling  Problem # 3:  INSOMNIA UNSPECIFIED (ICD-780.52)  His updated medication list for this problem includes:    Zolpidem Tartrate 12.5 Mg Cr-tabs (Zolpidem tartrate) .Marland Kitchen... 1po at bedtime as needed change to longer acting generic zolpidem  Complete Medication List: 1)  Zolpidem Tartrate 12.5 Mg Cr-tabs (Zolpidem tartrate) .Marland Kitchen.. 1po at bedtime as needed 2)  Ecotrin Low Strength 81 Mg Tbec (Aspirin) .Marland Kitchen.. 1 by mouth qd 3)  Medrol 4 Mg Tabs (Methylprednisolone) .... 2 by mouth daily 4)  Omeprazole 20 Mg Cpdr (Omeprazole) .Marland Kitchen.. 1 by mouth once daily 5)  Losartan Potassium 25 Mg  Tabs (Losartan potassium) .Marland Kitchen.. 1 by mouth once daily 6)  Metoprolol Tartrate 25 Mg Tabs (Metoprolol tartrate) .... 2 by mouth daily 7)  Leflunomide 20 Mg Tabs (Leflunomide) .... Take 1 tablet by mouth once a day 8)  Folic Acid 800 Mcg Tabs (Folic acid) .... 2 per day 9)  Fish Oil 1200 Mg Caps (Omega-3 fatty acids) .Marland Kitchen.. 1 by mouth daily 10)  Osteo Bi-flex Adv Triple St Tabs (Misc natural products) .... 2 by mouth daily 11)  Klonopin 1 Mg Tabs (Clonazepam) .... One by mouth two times a day as needed anxiety 12)  Lyrica 75 Mg Caps (Pregabalin) .... One by mouth two times a day for muscle aches as needed 13)  Furosemide 20 Mg Tabs (Furosemide) .Marland Kitchen.. 1 by mouth once daily 14)  Mucinex Dm 30-600 Mg Xr12h-tab (Dextromethorphan-guaifenesin) .... As needed 15)  Imodium A-d 2 Mg Tabs  (Loperamide hcl) .... As needed 16)  Tussin Dm 100-10 Mg/45ml Syrp (Dextromethorphan-guaifenesin) .... As needed 17)  Hydroxychloroquine Sulfate 200 Mg Tabs (Hydroxychloroquine sulfate) .Marland Kitchen.. 1 by mouth two times a day 18)  Fluoxetine Hcl 20 Mg Caps (Fluoxetine hcl) .Marland Kitchen.. 1po once daily  Other Orders: TLB-Sedimentation Rate (ESR) (85652-ESR)  Patient Instructions: 1)  stop the regular ambien you have at home 2)  Please take all new medications as prescribed  - the longer acting generic ambien, the refill on the klonopin, and the generic prozac 3)  Please go to the Lab in the basement for your blood and/or urine tests today 4)  Please schedule a follow-up appointment in 1 year or sooner if needed Prescriptions: FLUOXETINE HCL 20 MG CAPS (FLUOXETINE HCL) 1po once daily  #90 x 3   Entered and Authorized by:   Corwin Levins MD   Signed by:   Corwin Levins MD on 02/20/2010   Method used:   Print then Give to Patient   RxID:   [id number] KLONOPIN 1 MG TABS (CLONAZEPAM) One by mouth two times a day as needed anxiety  #60 x 3   Entered and Authorized by:   Corwin Levins MD   Signed by:   Corwin Levins MD on 02/20/2010   Method used:   Print then Give to Patient   RxID:   [id number] ZOLPIDEM TARTRATE 12.5 MG CR-TABS (ZOLPIDEM TARTRATE) 1po at bedtime as needed  #30 x 5   Entered and Authorized by:   Corwin Levins MD   Signed by:   Corwin Levins MD on 02/20/2010   Method used:   Print then Give to Patient   RxID:   [id number]

## 2010-12-18 NOTE — Assessment & Plan Note (Signed)
[facility]                            CARDIOLOGY OFFICE NOTE   NAME:[name] Michael, [name] Michael                      MRN:          [id number]  DATE:03/14/2008                            DOB:          [birth year]    REASON FOR VISIT:  Chest pain, shortness of breath, and finding of  pericardial effusion on CT scan earlier today.   HISTORY OF PRESENT ILLNESS:  Mr. [name] Michael is a pleasant 60 year old  Caucasian male with past medical history significant for hypertension,  gastroesophageal reflux disease, prostatitis, diagnosis of pulmonary  embolism 2 weeks ago, and an unknown connective tissue disorder who is  seen as a consult today per Dr. Shan Levans from our pulmonary  division here in [facility].  Mr. [name] Michael was in his normal  state of good health prior to 2 weeks ago when he was found to be  tachycardic, had some mild dyspnea, and subsequently was found to have a  pulmonary embolism.  He was treated with anticoagulation, which included  a study drug rivaroxaban.  He has been anticoagulated on this therapy  for the last 2 weeks.  Since his discharge from the hospital 2 weeks  ago, he has done well.  He was in his normal state of good health 3 days  ago on Friday March 11, 2008.  When he went to bed, he began to have a  dry cough, only productive of a small amount of mucus.  On the following  day, he felt very fatigued and began to have some chest discomfort that  seemed to be positional in nature.  He states that the chest pain  worsened when he leaned forward.  He also noted some shortness of breath  at this time.  His temperature was mildly elevated at 100.5.  He  continued to have a dry cough, but states that his chest pain did  improve some yesterday.  He was seen earlier this morning by Dr. Delford Field  and was sent for a CT scan of the chest to follow up on the pulmonary  embolism.  CT scan showed a moderate-sized pericardial effusion.  The  patient was also found to be tachycardic, and because of these reasons,  was referred here today for further evaluation.  Today, he describes a  mild chest discomfort that worsens with leaning forward.  He also notes  a mild continued cough and some dyspnea with exertion.   PAST MEDICAL HISTORY:  Significant for hypertension, gastroesophageal  reflux disease, pulmonary embolism diagnosed 2 weeks ago, Mallory-Weiss  syndrome in 1987, prostatitis, osteoarthritis of the right knee, and a  connective tissue disorder that is per the patient, thought to be  rheumatoid arthritis.   PAST SURGICAL HISTORY:  Appendectomy as a child.   ALLERGIES:  PLAQUENIL, which causes nausea and headache.   MEDICATIONS:  1. Exforge 5/160 mg once daily.  2. Enteric-coated aspirin 81 mg once daily.  3. Medrol 4 mg 2 tablets daily.  4. Axid AR 75 mg 2 tablets once daily.  5. Rivaroxaban 15 mg 1 pill twice daily.  6. Ambien 10 mg at  night.  7. Zyrtec 10 mg once daily.   SOCIAL HISTORY:  The patient admits to social alcohol use, but denies  the use of tobacco or illicit drugs.  He is married and has 3 children.   FAMILY HISTORY:  The patient's father had a diagnosis of coronary  disease at the age of 34 and has undergone a coronary bypass surgery.  His mother is alive and well.  He has 1 brother with hypertension and  another brother with diabetes mellitus.   REVIEW OF SYSTEMS:  As stated in the history of present illness, is  otherwise negative.   PHYSICAL EXAMINATION:  GENERAL:  He is a pleasant, middle-aged Caucasian  male, in no acute distress.  VITAL SIGNS:  His blood pressure is 92/69, pulse is 117 and regular,  respirations are 12 and nonlabored.  NECK:  No JVD noted.  No carotid bruits noted.  SKIN:  Warm and dry.  LUNGS:  Clear to auscultation bilaterally without wheezes, rhonchi, or  crackles noted.  CARDIOVASCULAR:  Tachycardic with normal S1 and S2.  No murmurs,  gallops, or rubs are  noted.  ABDOMEN:  Soft and nontender.  Bowel sounds are present.  EXTREMITIES:  No evidence of lower extremity edema.  Pulses are 2+ in  all extremities.   DIAGNOSTIC STUDIES:  1. A 2-D echo performed in our office today shows that the left      ventricular systolic function is normal.  The ejection fraction is      estimated at 55%.  There is no diagnostic evidence of left      ventricular regional wall motion abnormalities.  Left ventricular      wall thickness was in the upper limits of normal.  The aortic valve      was normal in appearance.  There is mild aortic root dilatation.      The mitral valve is noted to be normal.  There is a small-to-      moderate sized circumferential pericardial effusion.  There are no      echocardiographic signs of cardiac tamponade.  2. A 12-lead electrocardiogram shows sinus tachycardia and is      otherwise a normal EKG.  The ventricular rate is 116 beats per      minute.  3. CT of the chest performed today shows a moderate-sized pericardial      effusion, as well as a mild stable fusiform enlargement of the      ascending aorta with a maximum enlargement of 4 cm.  There is no CT      evidence for pulmonary emboli.  There were small bilateral pleural      effusions and bibasilar atelectasis.  4. Laboratory values today show a white blood cell count of 8.8,      hemoglobin 13.5, hematocrit 39.9, and platelet count 462,000.      Potassium 4.7, creatinine 0.9, AST 36, ALT 37, alkaline phosphatase      36, and albumin 2.9.   ASSESSMENT AND PLAN:  This is a pleasant 60 year old Caucasian male with  a past medical history significant for hypertension and an unknown  connective tissue disorder who presents with small-to-moderate sized  pericardial effusion, signs and symptoms of pericarditis, but no signs  of cardiac tamponade.  1. Acute pericarditis.  This is most likely secondary to a viral      source since the patient had a viral prodrome over  the weekend.  He      describes  upper respiratory-type symptoms that most likely related      to this.  He does also have an undiagnosed connective tissue      disorder, which is felt to be somewhere on the spectrum between      lupus and rheumatoid arthritis.  This could certainly also be the      cause of his pericarditis with small pericardial effusion.  His      hemoglobin is stable, and because of this, I do not feel that this      is probably a hemorrhagic pericardial effusion.  He is relatively      asymptomatic other than some mild chest discomfort, which is      probably secondary to the pericardial inflammation/irritation.      While his EKG is concerning with tachycardia, it is most likely      related to the pericarditis.  I think it is reasonable to try a      trial of a nonsteroidal antiinflammatory drugs over the next week.      I have suggested that he take ibuprofen 600 mg 3 times daily.  He      is also already on Medrol 8 mg once daily.  We will have him return      to our office in 1 week for a followup echocardiogram.  I will plan      on seeing him back in our office 2 weeks from today.  The patient      is instructed to alert our office should he become more symptomatic      in the next week to include worsened shortness of breath, worsened      fatigue, fevers, or worsened chest pain.     Verne Carrow, MD  Electronically Signed    CM/MedQ  DD: 03/14/2008  DT: 03/15/2008  Job #: [id number]   cc:   Charlcie Cradle. Delford Field, MD, FCCP

## 2010-12-18 NOTE — Assessment & Plan Note (Signed)
[facility]                            CARDIOLOGY OFFICE NOTE   NAME:[name], Michael                      MRN:          [id number]  DATE:05/26/2008                            DOB:          19-Apr-1951    Michael Michael is a complicated 60 year old patient, who was added onto my  schedule by Dr. Juanda Michael.   The patient has initially been seen by Dr. Clifton Michael on March 31, 2008.  The patient has a history of pulmonary emboli, undifferentiated  connective tissue disease, and reflux.  The patient's PE occurred in  July.  He was seen by Dr. Danise Michael for treatment.  He has been  enrolled the the Reveal trial.  This is due to stop on May 31, 2008,  which is the end of next week.  I explained to the patient that I  thought he should be on a blood thinner for 6 months.  He will need to  transition over to Coumadin if his trial drug is ending.   The patient had a followup CT scan to assess his PE.  At that time, he  was noted to have a moderate-sized pericardial effusion.   He has been on higher-dose steroids in the past, but he is currently  only on 8 mg a day.   He has been having palpitations.  He had some chest pain and shortness  of breath.  He saw Dr. Juanda Michael in the office on May 25, 2008.  He had  a D-dimer of 10.41.   However, another followup CT scan showed no evidence of PE.  He had  bilateral pleural effusions and his previous pericardial effusion has  gone.   In talking to the patient, he is doing somewhat better.  His pain is  atypical.  It is sharp.  It is in the middle of his chest.  It can  radiate to the shoulders.  Both of his shoulders are significantly sore  from his arthritis.  He continues to have swelling in his hands from his  arthritis.  His breathing seems a bit better.  There is no PND,  orthopnea, no lower extremity edema.  His palpitations seem benign.  He  is not having prolonged episodes.  He feels occasional  flip-flops.   He was set up to have a 2-D echocardiogram today.  I have reviewed this  at length.  He has low-normal EF of 50-55%.  There is no abnormal septal  bounce.  Right-sided cardiac chambers were normal.  There is no  pericardial effusion.   He had trivial aortic insufficiency and trivial-to-mild MR.   I have reviewed his lab work.  His D-dimer was 10.41.  His BNP was only  99.  He had a white count of 4.5, hematocrit of 33.8.  Sed rate was up  to 57, creatinine was 0.8, potassium was 4.1.   CURRENT MEDICATIONS INCLUDE:  1. Rivaroxaban 20 mg a day.  2. Exforge 5/160.  3. Methylprednisolone 8 mg a day.  4. Zolpidem 10 mg a day.  5. Axid AR 75 a day.  6. Fish oil.  ALLERGIES:  No known allergies.   PHYSICAL EXAMINATION:  VITAL SIGNS:  Remarkable for a weight of 207,  blood pressure is 115/80, there is no pulse, and respiratory rate is 14.  HEENT:  Unremarkable.  JVP is not elevated.  There is normal regression  of JVD with inspiration and no Kussmaul sign.  LUNGS:  Decreased breath sounds bilaterally, right greater than left.  There is an S1 and S2 without rub.  PMI is normal.  ABDOMEN:  Benign.  Bowel sounds positive, AAA, no tenderness, no bruit,  no hepatosplenomegaly, no hepatojugular reflux, no tenderness.  EXTREMITIES:  Distal pulses intact.  No edema.  NEUROLOGIC:  Nonfocal.  SKIN:  Warm and dry.  No muscular weakness.  There is not much swelling  or signs of scleroderma around the fingertips.   IMPRESSION:  1. Chest pain, atypical, maybe related to pericardial irritation given      his previous pericardial effusion.  Echo currently not showing any      effusion, low likelihood of coronary artery disease.  2. 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  On exam, he continues to      have bilateral pleural effusions.  I suspect at some point, he      should be placed on a diuretic.  However, overall I think he should      be referred for right and left heart  catheterization to assess his      right-sided pressures and rule out any constrictive process.  Since      he is due to stop his study drug next week, this would be a good      time to transition him off a blood thinner, do his heart      catheterization, and then restart Coumadin.  3. Anticoagulation.  It is unclear to me who is calling the shots in      regards to his Coumadin.  Dr. Jonny Michael is the patient's primary MD.      Dr. Delford Michael saw him in the hospital for pulmonary embolism.  I      think, the recommendations despite his current CT not showing a      pulmonary embolism, would be for 6 months of anticoagulation.  If      the study drug is stopping in 3 months, he will need to be      transitioned to Coumadin.  I do not think he needs a heparin      overlap in between his catheterization.  4. 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      I think, the patient should be on a higher dose of steroids.      Apparently, he has been on Plaquenil in the past and not tolerated      it.  We will get an appointment for him to see Dr. Jimmy Michael to see      if he needs a higher-pulse dose of steroids to help.  I would think      that if his inflammation were under control, his sed rate would be      under 15.  5. The patient will have followup chest x-ray and labs      precatheterization during his visit to the [facility].  I will      try to arrange his heart catheterization with Dr. Clifton Michael, who is      his primary doctor in our group, and further  recommendations      regarding his heart will be given after his heart catheterization.      However, I think this is all a secondary pleuropericarditis from      his connective tissue disease, which currently is not being      adequately treated.  Incidentally, the patient's sister also has      significant connective tissue disease with a lupus-like  syndrome.     Noralyn Pick. Eden Emms, MD, [facility]  Electronically Signed    PCN/MedQ  DD: 05/26/2008  DT: 05/27/2008  Job #: [id number]

## 2010-12-18 NOTE — Discharge Summary (Signed)
NAME:  [name] Michael, [name] Michael             ACCOUNT NO.:  [id number]   MEDICAL RECORD NO.:  [id number]          PATIENT TYPE:  INP   LOCATION:  4728                         FACILITY:  [facility]   PHYSICIAN:  Raenette Rover. Felicity Coyer, MDDATE OF BIRTH:  1951-07-18   DATE OF ADMISSION:  03/01/2008  DATE OF DISCHARGE:  03/03/2008                               DISCHARGE SUMMARY   PRIMARY CARE PHYSICIAN:  Corwin Levins, MD   RHEUMATOLOGIST:  Lemmie Evens, MD   DISCHARGE DIAGNOSES:  1. Left lower lobe pulmonary embolism.  2. 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  3. Anemia of chronic disease.   HISTORY OF PRESENT ILLNESS:  Mr. Xia is a very pleasant 60 year old  white male with past medical history of hypertension as well as  undifferentiated tissue disorder on chronic steroid use currently being  evaluated by Dr. Jimmy Footman.  The patient presented to [facility]  Emergency Room on day of admission with reports of awaking that morning  with midsternal chest pain worse upon inspiration.  The patient  presented to previously scheduled appointment with his rheumatologist at  which time his heart rate was found to be in the 150s and the patient  transported to the ED for further evaluation and treatment.  Upon  evaluation in the ER, lab work revealed an elevated D-dimer at 2.64 at  which time a CT angio of the chest was performed revealing left lower  lobe pulmonary embolism.  The patient was admitted at that time for  further evaluation and treatment.   PAST MEDICAL HISTORY:  1. Hypertension.  2. Undifferentiated MCTD on chronic steroids.  3. Hyperlipidemia.  4. Mallory-Weiss syndrome.  5. Chronic anemia.  6. GERD.  7. Peptic ulcer disease.   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  Therefore, a PE may likely be secondary to this  prolonged  sitting, however, cannot rule out PE secondary to the  patient's rheumatological disorder.  A hypercoag panel obtained during  this admission was unremarkable.  The patient was started on Coumadin  with Lovenox bridge at the time of admission; however, the patient at  this time has decided to enroll in the [facility] Trial or he  will be receiving Rivaroxaban.  However, research associates have  reviewed trial as well as drug effects in depth with the patient.  The  patient at this time has signed consent.  Pharmacy was notified to  discontinue Lovenox and Coumadin.  The patient is to follow up with  clinical research coordinator for his followup visit.  The patient  without any shortness of breath or chest pain at this time.  His O2  saturation has remained stable throughout hospitalization.   MEDICATIONS AT THE TIME OF DISCHARGE:  1. Lotrel 5-20 mg p.o. daily.  2. Axid 75 mg two tabs p.o. daily.  3. Zyrtec 10 mg p.o. daily.  4. Zolpidem 10 mg p.o. at bedtime p.r.n.  5. Medrol 4 mg tabs 3 tabs p.o.  daily, unless otherwise instructed.  6. Meloxicam 7.5 mg tabs 1-2 tablets p.o. q.6 h. p.r.n.  7. Rivaroxaban as directed.   PERTINENT LABORATORY WORK DURING HOSPITALIZATION:  White cell count 9.6,  platelet count 319, hemoglobin 11.9, and hematocrit 36.8.  Sodium 132  and potassium 3.9.  BUN 11, creatinine 0.72.  Hypercoagulable panel  within normal limits.  Total cholesterol 131, triglycerides 53, HDL 31,  LDL 89, TSH 0.674.  Cardiac enzymes were negative x2.  INR at the time  of dictation 1.6.   DISPOSITION:  The patient felt medically stable for discharge home at  this time.  As mentioned above, he will follow up with research  coordinator in regards to PE treatment.  The patient is also scheduled  to follow up with his primary care physician, Dr. Oliver Barre, on  Wednesday March 09, 2008, at 2:00 p.m. at which time the patient may  need clearance to return back to  work.      Cordelia Pen, NP      Raenette Rover. Felicity Coyer, MD  Electronically Signed    LE/MEDQ  D:  03/03/2008  T:  03/03/2008  Job:  [id number]   cc:   Corwin Levins, MD

## 2010-12-18 NOTE — Cardiovascular Report (Signed)
NAME:  [name] Michael, [name] Michael NO.:  [id number]   MEDICAL RECORD NO.:  [id number]          PATIENT TYPE:  OIB   LOCATION:  1962                         FACILITY:  [facility]   PHYSICIAN:  Verne Carrow, MDDATE OF BIRTH:  12/09/1950   DATE OF PROCEDURE:  06/09/2008  DATE OF DISCHARGE:  06/09/2008                            CARDIAC CATHETERIZATION   INDICATIONS:  Chest pain and shortness of breath in a 60 year old  Caucasian male with a recent diagnosis of pericardial effusion.  The  test today is to rule out coronary artery disease as a cause of chest  pain as well as to rule out a constrictive physiology from the prior  pericarditis.   OPERATOR:  Verne Carrow, MD   PROCEDURES PERFORMED:  1. Left heart catheterization.  2. Selective coronary angiography.  3. Right heart catheterization.  4. Left ventricular angiogram.   Details of Procedure: The patient was brought into the outpatient  cardiac catheterization laboratory after signing informed consent. The  right groin was prepped and draped in a sterile fashion. A 4 french  sheath was placed in the the right femoral artery. A 6Fr sheath was  placed in the right femoral vein. A 4 Fr pigtail catheter was placed in  the LV. Right heart cath was performed with a multipurpose catheter with  simultaneous pressures measured in the LV while the RA, RV, PA and PCWP  pressures were obtained. Following the right heart cath, LV angiogram  was performed. Selective coronary angiography was performed with  standard catheters. The patient was taken to recovery in stable  condition.   FINDINGS:  Hemodynamic findings:  Central aortic pressure 107/73; left  ventricular pressure was 7/14; end-diastolic pressure 21; right atrial  pressure 10/8, with a mean of 7; right ventricular pressure 23/6, with a  mean of 3; pulmonary artery pressure 26/10, with a mean of 18; pulmonary  capillary wedge pressure with a mean of 12.  Cardiac  output 6.9.  Cardiac index 3.2.  Saturations, right atrial sat 76%, pulmonary artery  sat 70%, and left ventricular sat 95%.   ANGIOGRAPHIC FINDINGS:  1. Left main coronary artery bifurcates to the circumflex and the LAD,      and has no evidence of disease.  2. The left anterior descending is a large vessel that courses to the      apex and has mild plaque disease noted in the midportion of the      vessel.  3. The circumflex has plaque disease noted in the midportion.  There      is also an aneurysmal segment in the midportion.  There is a large      ramus intermedius branch that has no evidence of disease.  4. The right coronary artery is a 30% stenosis in the proximal      portion, 30% stenosis in the midportion, and serial 30-40% lesions      in the distal portion.  The right coronary artery is a dominant      vessel.  5. Left ventricular angiogram shows mild segmental systolic      dysfunction of the left ventricle.  There is hypokinesis of the      anterior and apical wall.  Ejection fraction is estimated at 40-      45%.   IMPRESSION:  1. Nonobstructive coronary artery disease.  2. Segmental left ventricular dysfunction.  3. No evidence of constrictive physiology.  4. Normal right-sided heart pressures.   RECOMMENDATIONS:  I will plan on starting the patient on aspirin, a  statin medication as well as a beta-blocker.  I think, he will benefit  from the beta-blocker both in regards of his coronary plaque disease as  well as his resting tachycardia.  I do not find any evidence of  constrictive pericarditis.  I will plan on seeing him back in my office  in 3 weeks to review his medications, to follow up his heart  catheterization, and also to adjust his medications accordingly.      Verne Carrow, MD  Electronically Signed     CM/MEDQ  D:  06/09/2008  T:  06/09/2008  Job:  [id number]

## 2010-12-18 NOTE — Assessment & Plan Note (Signed)
[facility]                                 ON-CALL NOTE   NAME:WILLIAMSMeric, Joye                      MRN:          [id number]  DATE:05/25/2008                            DOB:          24-Jan-1951    I received a call from [facility] this evening with regards to Marcille Buffy.  He has D-dimer drawn earlier today in the office and it came  back at 10.41.  Mr. Xia saw Dr. Juanda Chance in the office today  secondary to chest pain and dyspnea, and the D-dimer was evaluated  because he has a history of pulmonary embolism and is actually currently  being treated with rivaroxaban 20 mg daily, which is a study drug.  I  spoke with Mr. Vanhouten to discuss the D-dimer and also recommend that  he comes in to the ER for a CT of his chest this evening.  He has  indicated that he feels okay and that he has already been scheduled for  an echocardiogram tomorrow in the office as well as a CT angio of the  chest.  Again, I recommended that he come in this evening for the CT,  and he would prefer to wait until tomorrow.  He will, therefore, present  to the office tomorrow as scheduled for CT angio of the chest to rule  out PE.  He will continue his study drug.     Nicolasa Ducking, ANP  Electronically Signed    CB/MedQ  DD: 05/25/2008  DT: 05/26/2008  Job #: [id number]

## 2010-12-18 NOTE — Assessment & Plan Note (Signed)
[facility]                            CARDIOLOGY OFFICE NOTE   NAME:WILLIAMSEdiberto, Sens                      MRN:          [id number]  DATE:05/25/2008                            DOB:          04-05-1951    CLINICAL HISTORY:  Mr. Vallee is 60 years old and was hospitalized in  June 2009 with pulmonary embolus.  His symptoms at that time were  tachycardia and shortness of breath.  There was no predisposing cause.  He was subsequently put on ROCKET study drug.  He had a followup CT scan  because of recurrent tachycardia in August 20009, and this showed no  evidence of pulmonary emboli.  It did show a moderate effusion, and he  subsequently had an echo on March 23, 2008, which showed only a small  pericardial effusion.  He did pretty well until the past week when he  developed increasing symptoms of shortness of breath and chest pain.  He  describes the chest pain as a pain in the lower subxiphoid area which is  worse with deep breathing or if he exercises and breathes deeply.  He  also has gotten short of breath with minimal exertion.  He subsequently  saw Dr. Earney Hamburg on March 31, 2008, and he was doing somewhat  better, although he had persistent tachycardia.  He had no evidence of  jugular venous distention at that time.  He has also seen Dr. Jimmy Footman  and was told that he had an undiagnosed connective tissue disorder.  I  do not have the laboratory studies or documentation for that diagnosis.   PAST MEDICAL HISTORY:  Significant for the problems above.   CURRENT MEDICATIONS:  Include methylprednisolone 4 mg b.i.d., ROCKET  study drug, Ambien, Osteo Bi-Flex, fish oil and p.r.n. Motrin.     This became worse this morning, and he called and came in for  evaluation.   PHYSICAL EXAMINATION:  VITAL SIGNS:  Blood pressure is 120/78, pulse 125  and regular.  NECK:  Venous pulsations were visible 1 cm above the clavicle.  CHEST:  Decreased breath  sounds at both lung bases.  HEART:  The cardiac rhythm was regular but fast.  I could hear no  murmurs.  The first sound was increased, and the second sound was  borderline increased.  ABDOMEN:  Soft with normal bowel sounds.  There is no  hepatosplenomegaly.  EXTREMITIES:  No peripheral edema.  Pedal pulses are equal.   Electrocardiogram showed sinus tachycardia and was otherwise normal.  His chest x-ray showed borderline heart size.  Both diaphragms were  elevated, and the left hemidiaphragm was more elevated; there was  bilateral atelectasis.  This represented a change from the last chest x-  ray we had which was from June 2009.   IMPRESSION:  1. Shortness of breath and chest pain of uncertain etiology.  2. History of pulmonary embolism in June 2009 without predisposing      cause.  3. History of undiagnosed connective tissue disorder.  4. ROCKET study drug.   RECOMMENDATIONS:  I am uncertain regarding the etiology of Mr. Frei'  signs and symptoms.  I am concerned about the possibility of recurrent  pulmonary embolism, although the fact that he had a second CT scan which  was negative makes this less likely.  With his elevated neck veins, I am  concerned about hemodynamically significant pericardial effusion,  although his last echo showed only a small effusion.  I am also  concerned about the possibility of a immunologic disease in his lungs.  Since he is on a new study drug, we will have to keep in mind that there  is a remote possibility he could have had an allergic reaction to this  drug.  Will plan to get a CBC, BMP, BNP, sed rate and a D-dimer.  He  previously had a normal TSH.  We will plan to have him have a CT angio  with contrast tomorrow to rule out recurrent pulmonary emboli and will  have an echocardiogram to rule out hemodynamically significant  pericardial effusion and cardiomyopathy.  I will have him seen by either  Dr. Clifton [name] or doctor of the day tomorrow  after his studies.  If we do  not have an answer, we will need to get pulmonary and immunologic  consults to help [place] with the diagnosis.     Bruce Elvera Lennox Juanda Chance, MD, [facility]  Electronically Signed    BRB/MedQ  DD: 05/25/2008  DT: 05/25/2008  Job #: [id number]   cc:   Corwin Levins, MD  Charlcie Cradle Delford Field, MD, FCCP  Lemmie Evens, M.D.

## 2010-12-18 NOTE — Assessment & Plan Note (Signed)
[facility]                            CARDIOLOGY OFFICE NOTE   NAME:[name] Michael, Ponder                      MRN:          [id number]  DATE:07/04/2008                            DOB:          [birth year]    PRIMARY CARE PHYSICIAN:  Corwin Levins, MD   HISTORY OF PRESENT ILLNESS:  Mr. Pellerito is a pleasant 60 year old  Caucasian male with a past medical history significant for hypertension,  gastroesophageal reflux disease, prostatitis, and an unknown connective  tissue disorder as well as a diagnosis of pulmonary embolism in July  2009, who was initially seen in our Cardiology office on March 31, 2008.  The patient had been followed by Dr. Shan Levans of the  Pulmonary division for his pulmonary embolism and was on a study drug  Rivaroxaban for anticoagulation.  Followup CT of the chest on March 31, 2008, showed no evidence of pulmonary embolism.  However, there was a  moderate-sized pericardial effusion noted.  The patient was tachycardic  and because of this he was sent to our Cardiology office.  We elected to  follow this at that time and started a nonsteroidal anti-inflammatory  drug.  Followup echocardiogram showed that the pericardial effusion had  resolved.  The patient continued to be tachycardic.  He had no evidence  of recurrence of his pulmonary embolism.  However, secondary complaints  of chest pain and shortness of breath, returned to our Cardiology office  on May 25, 2008, and was seen by Dr. Charlies Constable.  The patient's D-  dimer was elevated at 10.0.  CT of the chest was then ordered and showed  no evidence of recurrent PE, however, there were bilateral pleural  effusions noted.  Followup in our office on May 26, 2008, with Dr.  Maurine Cane, at which time the patient was scheduled for a left and  right heart catheterization which was performed by myself on June 09, 2008.  The catheterization was ordered to rule out  coronary artery  disease as well as to rule out possible constrictive physiology to go  along with constrictive pericarditis.  The heart catheterization  demonstrated mild nonobstructive coronary artery disease and no evidence  of constrictive physiology on the right heart catheterization.  This  will be outlined in detail below.  Because of his mild non-obstructive  coronary artery disease as well as his resting tachycardia, I elected to  start him on a beta-blocker therapy.  I also started him on aspirin and  a statin medication.  The patient returns today to follow up after his  heart catheterization.  He tells me that he has been seen by Dr.  Jimmy Footman and was started on methotrexate therapy.  He does not seem to  be tolerating this therapy well as he reports increased fatigue as well  as some nausea and headaches.  He continues to note mild baseline  shortness of breath, but denies any chest pain.  He tells me that his  heart rate has been in the low 100s at home over the last several days.  His heart rate  had been well controlled in the 70s and 80s over the last  few weeks after starting beta-blocker therapy.  He denies any dizziness,  near syncope, syncope, or lower extremity edema.   PAST MEDICAL HISTORY:  Unchanged and is outlined above.   CURRENT MEDICATIONS:  1. Ambien 10 mg once daily.  2. Azid extended-release 75 mg once daily.  3. Osteo Bi-Flex twice daily.  4. Fish oil 1200 mg once daily.  5. Lipitor 20 mg once daily.  6. Enteric-coated aspirin 81 mg once daily.  7. Coumadin as directed.  8. Methylprednisolone 8 mg once daily.  9. Methotrexate 100 mg once weekly.  10.Folic acid 2 mg once daily.  11.Lopressor 25 mg twice daily.   REVIEW OF SYSTEMS:  As outlined above and is otherwise negative.   PHYSICAL EXAMINATION:  VITAL SIGNS:  Blood pressure 90/60, pulse 60 and  regular, respirations 12 and nonlabored.  GENERAL:  He is a pleasant, middle-aged Caucasian male in  no acute  distress.  He is alert and oriented x3.  PSYCHIATRIC:  Mood and affect are normal.  MUSCULOSKELETAL:  Muscular strength and tone is normal.  NEUROLOGICAL:  No focal neurological deficits.  SKIN:  Warm and dry.  HEENT:  Normal.  NECK:  No JVD.  No carotid bruits.  No thyromegaly.  No lymphadenopathy.  LUNGS:  There are mild bibasilar crackles with otherwise clear lung  fields.  There is good air movement bilaterally.  CARDIOVASCULAR:  Regular rate and rhythm without murmurs, gallops, or  rubs noted.  ABDOMEN:  Soft and nontender.  Bowel sounds are present.  EXTREMITIES:  No evidence of lower extremity edema.  Pulses are 2+ in  all extremities.   DIAGNOSTIC STUDIES:  1. Left and right heart catheterization performed on June 09, 2008,      with findings as follows.  The right heart catheterization      demonstrated a right atrial pressure of 7, right ventricular      pressure 23/6 with a mean of 3, pulmonary artery pressure 26/10      with a mean of 18, pulmonary capillary wedge pressure with a mean      of 12.  Cardiac output was 6.9.  Cardiac index was 3.2.  Left heart      catheterization with selective coronary angiography demonstrated no      evidence of disease in the left main coronary artery.  The left      anterior descending large vessel that coursed to the apex and had      mild plaque disease noted in the midportion of the vessel.      Circumflex artery had plaque noted in the midportion.  There was a      large ramus intermedius branch with no evidence of disease.  The      right coronary artery had serial 30% lesions in the proximal mid      and distal portions.  The right coronary artery was a dominant      vessel.  Left ventricular angiogram showed mild segmental systolic      dysfunction of the left ventricle with subtle hypokinesis of the      anteroapical wall.  Ejection fraction was estimated at 45%-50%.  2. Echocardiogram dated May 26, 2008, shows  hypokinesis in the mid      lateral wall and hypokinesis in the inferoposterior base.  Ejection      fraction was estimated at 55%.  Left ventricular wall thickness was  moderately increased.  There was mild aortic valvular regurgitation      noted.  3. CT angiogram performed on May 26, 2008, was negative for      pulmonary embolism or the pericardial effusion that was noted on      the prior study.  There was small to moderate bilateral pleural      effusions noted.  There was trace perisplenic ascites noted.   ASSESSMENT AND PLAN:  This is a complicated 60 year old Caucasian male  with recent history of pulmonary embolism, pericardial effusion, and  pleural effusions, undiagnosed connective tissue disorder, currently on  prednisone and methotrexate therapy under the guidance of Dr. Jimmy Footman  as well as a new diagnosis of nonobstructive coronary artery disease  with resting tachycardia, who presents today for followup.  It is  difficult at this time to explain all of his different complaints based  on one finding.  It is most likely that his atypical chest pain is  related to some pericardial irritation from prior pericardial effusion  as well as pleural irritation from his pleural effusions.  I am not sure  currently what is the etiology of his pleural effusions, but it is most  likely secondary to his undiagnosed connective tissue disease.  His  right heart catheterization was not suggestive of a constrictive  physiology.  He continues to have some resting tachycardia which I  cannot explain at this time.  I started him on an aspirin, a statin, and  a beta-blocker for his nonobstructive coronary artery disease and hopes  that this would also help with his resting tachycardia.  In regards to  his recent pulmonary embolism, the patient completed the trial drug and  was started on Coumadin for anticoagulation.  This has been followed in  Dr. Raphael Gibney office.  The patient tells me  that he is scheduled to see Dr.  Jimmy Footman in followup for his connective tissue disorder within the next  several weeks.  We will check liver function tests as well as fasting  lipid profile today and we will also get a chest x-ray to look at the  size of his pleural effusions currently.  I will not make any medication  changes at the current time.  I think that the etiology of most of his  symptoms ties into his undiagnosed connective tissue disorder.  I would  like to see him back in our office in 6 months.  The patient will follow  up closely with his primary care physician Dr. Jonny Ruiz as well as his  rheumatologist Dr. Jimmy Footman.     Verne Carrow, MD  Electronically Signed    CM/MedQ  DD: 07/04/2008  DT: 07/05/2008  Job #: [id number]   cc:   Corwin Levins, MD  Lemmie Evens, M.D.

## 2010-12-18 NOTE — Assessment & Plan Note (Signed)
[facility]                            CARDIOLOGY OFFICE NOTE   NAME:Lai, OMARIO                      MRN:          [id number]  DATE:03/31/2008                            DOB:          1950-11-28    HISTORY OF PRESENT ILLNESS:  Mr. Copelan is a pleasant 60 year old  Caucasian male with past medical history significant for hypertension,  gastroesophageal reflux disease, prostatitis, an unknown connective  tissue disorder and a recent diagnosis of a pulmonary embolism just over  a month ago who was initially seen by Dr. Shan Levans for treatment  of his pulmonary embolism.  On March 14, 2008, the patient was having  followup CT of the chest to evaluate his pulmonary embolism.  During  that study, he was noted to have a moderate-sized pericardial effusion.  I saw him in our office that afternoon and felt that it would be most  appropriate to follow up with an echocardiogram 1 week later.  I also  started a nonsteroidal anti-inflammatory drug during that visit.  The  patient returns today for followup.   Mr. Quebedeaux states that he has been doing well since his last visit in  our office.  He has been following his vital signs and has noted that  his heart rate has been around 100-105.  This morning, he woke up and  found that his heart rate was 129 on his home blood pressure cuff.  He  denies any complaints of sharp or pressure-like chest pain.  He also  denies any awareness of palpitations, diaphoresis, near syncope,  syncope, orthopnea, PND or lower extremity edema.  He does mention that  he has been battling cold-like symptoms with postnasal drainage and  cough.  He has been producing clear mucous with his cough.  He denies  any fevers, chills or rigors, but has had some diarrhea over the last  few days.  His wife is also sick.   CURRENT MEDICATIONS:  1. Rivaroxaban 20 mg once daily (study drug).  2. Exforge 5/160 mg once daily.  3.  Methoprednisolone 4 mg twice daily.  4. Ambien 10 mg once daily.  5. Azid ER 75 mg once daily.  6. Osteo Bi-Flex  twice daily.  7. Fish oil once daily.   ALLERGIES:  PLAQUENIL, which causes nausea and headache.   PHYSICAL EXAMINATION:  GENERAL:  He is a pleasant well-appearing middle-  aged Caucasian male in no acute distress.  VITALS:  Weight 201 pounds, blood pressure 111/72, respiratory rate 10-  12, pulse 128 and regular.  NECK:  No JVD.  No carotid bruits.  No lymphadenopathy.  OROPHARYNX:  Mucous membranes are moist.  SKIN:  Warm and dry.  LUNGS:  Clear to auscultation bilaterally with no wheezes, rhonchi or  crackles noted.  CARDIOVASCULAR:  Tachycardia with regular rhythm.  Normal first and  second heart sound.  No murmurs, gallops or rubs are noted.  ABDOMEN:  Soft, nontender.  Bowel sounds are present.  EXTREMITIES:  No evidence of edema.  Pulses are 2+ in all extremities.  NEUROLOGICAL:  No focal deficits.   DIAGNOSTIC  STUDIES:  1. Followup echocardiogram performed on March 23, 2008, shows normal      left ventricular systolic function with an estimated ejection      fraction of 60%.  There were no regional wall motion abnormalities      of the left ventricle.  There was trivial aortic valve      regurgitation.  The aortic root was at upper limits of normal in      size.  The right ventricle was normal in size.  There was normal      right ventricular systolic function.  PA systolic pressure was      estimated at 34-38 mmHg.  Pulmonary artery was mildly dilated.  The      inferior vena cava measured 2.3 cm was greater than 50% respiratory      collapse.  There was a small pericardial effusion noted.  There was      no evidence of tamponade on this echocardiogram.  2. 12-lead electrocardiogram obtained in our office today shows sinus      tachycardia with a ventricular rate of 128 beats per minute and      nonspecific T-wave changes.  There are several PVCs noted on  this      tracing.  His PR interval was 136 milliseconds which called into      the normal range.  Corrected QT interval was 446 milliseconds.   ASSESSMENT AND PLAN:  This is a pleasant 60 year old Caucasian male with  a history of hypertension, GERD, prostatitis, recent diagnosis of  pulmonary embolism, on anticoagulation and unknown connective tissue  disorder and recent finding of small-to-moderate pericardial effusion  that has decreased in size since the prior echocardiogram 2 weeks ago.  The patient is overall asymptomatic from a cardiovascular standpoint.  He does have sinus tachycardia today, which I cannot explain currently.  The only symptoms that he gives me are symptoms that are consistent with  upper respiratory type infection.  As stated above, he does deny any  fevers, chills or rigors.  His lungs seem clear on my examination.  I  will go ahead and get a PA and lateral chest x-ray to rule out any  pulmonary infiltrate given his recent history of pulmonary embolism and  pericardial effusion.  I will also check a CBC as anemia and infection  could be one of the sources of his sinus tachycardia.  I will not make  any changes in his medications at the current time.  I will also plan on  speaking to Dr. Shan Levans of the Pulmonary Critical Care Division  to alert him of Mr. Tapanes sinus tachycardia as Mr. Glascoe is on a  study drug for anticoagulation.  I will not schedule a followup at this  time, but will notify Mr. Yoakum later this evening of his test  results and we will make plans for followup at that time.     Verne Carrow, MD  Electronically Signed    CM/MedQ  DD: 03/31/2008  DT: 04/01/2008  Job #: [id number]   cc:   Charlcie Cradle. Delford Field, MD, [facility]  Luis Abed, MD, [facility]

## 2011-03-21 ENCOUNTER — Other Ambulatory Visit: Payer: Self-pay | Admitting: Internal Medicine

## 2011-03-21 NOTE — Telephone Encounter (Signed)
Called the patient left message to call and schedule office visit with Dr. Jonny Ruiz.

## 2011-03-21 NOTE — Telephone Encounter (Signed)
Faxed hardcopy to [address].

## 2011-03-24 ENCOUNTER — Encounter: Payer: Self-pay | Admitting: Internal Medicine

## 2011-03-24 DIAGNOSIS — Z0001 Encounter for general adult medical examination with abnormal findings: Secondary | ICD-10-CM | POA: Insufficient documentation

## 2011-03-24 DIAGNOSIS — Z0000 Encounter for general adult medical examination without abnormal findings: Secondary | ICD-10-CM | POA: Insufficient documentation

## 2011-03-25 DIAGNOSIS — K219 Gastro-esophageal reflux disease without esophagitis: Secondary | ICD-10-CM

## 2011-03-27 ENCOUNTER — Telehealth: Payer: Self-pay

## 2011-03-27 DIAGNOSIS — Z0000 Encounter for general adult medical examination without abnormal findings: Secondary | ICD-10-CM

## 2011-03-27 NOTE — Telephone Encounter (Signed)
Ordered the patients lab.

## 2011-03-28 ENCOUNTER — Ambulatory Visit (INDEPENDENT_AMBULATORY_CARE_PROVIDER_SITE_OTHER): Payer: BC Managed Care – PPO | Admitting: Internal Medicine

## 2011-03-28 ENCOUNTER — Other Ambulatory Visit (INDEPENDENT_AMBULATORY_CARE_PROVIDER_SITE_OTHER): Payer: BC Managed Care – PPO

## 2011-03-28 ENCOUNTER — Other Ambulatory Visit: Payer: Self-pay

## 2011-03-28 ENCOUNTER — Encounter: Payer: Self-pay | Admitting: Internal Medicine

## 2011-03-28 VITALS — BP 100/64 | HR 100 | Temp 98.2°F | Ht 72.0 in | Wt 218.1 lb

## 2011-03-28 DIAGNOSIS — M130 Polyarthritis, unspecified: Secondary | ICD-10-CM

## 2011-03-28 DIAGNOSIS — Z0000 Encounter for general adult medical examination without abnormal findings: Secondary | ICD-10-CM

## 2011-03-28 DIAGNOSIS — I712 Thoracic aortic aneurysm, without rupture: Secondary | ICD-10-CM

## 2011-03-28 LAB — LIPID PANEL
HDL: 32.4 mg/dL — ABNORMAL LOW (ref >39.00)
Triglycerides: 149 mg/dL (ref 0.0–149.0)

## 2011-03-28 LAB — BASIC METABOLIC PANEL
Chloride: 106 mEq/L (ref 96–112)
Potassium: 4 mEq/L (ref 3.5–5.1)

## 2011-03-28 LAB — CBC WITH DIFFERENTIAL/PLATELET
Basophils Absolute: 0.1 10*3/uL (ref 0.0–0.1)
Basophils Relative: 0.7 % (ref 0.0–3.0)
Eosinophils Absolute: 0.2 10*3/uL (ref 0.0–0.7)
Lymphocytes Relative: 26.7 % (ref 12.0–46.0)
MCHC: 33 g/dL (ref 30.0–36.0)
Monocytes Relative: 10.1 % (ref 3.0–12.0)
Neutrophils Relative %: 60.6 % (ref 43.0–77.0)
RBC: 4.63 Mil/uL (ref 4.22–5.81)
RDW: 13.6 % (ref 11.5–14.6)

## 2011-03-28 LAB — HEPATIC FUNCTION PANEL
ALT: 42 U/L (ref 0–53)
AST: 25 U/L (ref 0–37)
Bilirubin, Direct: 0.1 mg/dL (ref 0.0–0.3)
Total Bilirubin: 0.7 mg/dL (ref 0.3–1.2)

## 2011-03-28 LAB — URINALYSIS, ROUTINE W REFLEX MICROSCOPIC
Bilirubin Urine: NEGATIVE
Ketones, ur: NEGATIVE
Total Protein, Urine: NEGATIVE
Urine Glucose: NEGATIVE
pH: 6 (ref 5.0–8.0)

## 2011-03-28 LAB — PSA: PSA: 0.61 ng/mL (ref 0.10–4.00)

## 2011-03-28 NOTE — Assessment & Plan Note (Signed)
Conts to f/u with Dr Dierdre Forth on current Areva with  q 44mo labs - lfts, bun/cr; stable overall by hx and exam, , and pt to continue medical treatment as before

## 2011-03-28 NOTE — Assessment & Plan Note (Signed)
Due for MRA and f/u with Dr Clifton Aliveah Gallant  - will refer for OV per pr preference for now

## 2011-03-28 NOTE — Assessment & Plan Note (Signed)

## 2011-03-28 NOTE — Patient Instructions (Addendum)
You will be contacted regarding the referral for: cardiology Please call the phone number [phone] (the [employer]Tree System) for results of testing in 2-3 days;  When calling, simply dial the number, and when prompted enter the MRN number above (the Medical Record Number) and the # key, then the message should start. Please return in 1 year for your yearly visit, or sooner if needed, with Lab testing done 3-5 days before

## 2011-03-28 NOTE — Progress Notes (Signed)
Subjective:    Patient ID: [name] Michael, male    DOB: [birth year], 60 y.o.   MRN: [id number]  HPI  Here for wellness and f/u;  Overall doing ok;  Pt denies CP, worsening SOB, DOE, wheezing, orthopnea, PND, worsening LE edema, palpitations, dizziness or syncope.  Pt denies neurological change such as new Headache, facial or extremity weakness.  Pt denies polydipsia, polyuria, or low sugar symptoms. Pt states overall good compliance with treatment and medications, good tolerability, and trying to follow lower cholesterol diet.  Pt denies worsening depressive symptoms, suicidal ideation or panic. No fever, wt loss, night sweats, loss of appetite, or other constitutional symptoms.  Pt states good ability with ADL's, low fall risk, home safety reviewed and adequate, no significant changes in hearing or vision, and occasionally active with exercise.  Due for cardiollogy f/u . Sees rheum on regular basis, Did have cataract done on right earlier this yr,  prob due for left ot be done soon.  Prozac seemed to interfere with sleep, so stopped after one month July 2011.  Since then  - Denies worsening depressive symptoms, suicidal ideation, or panic, though has ongoing anxiety, not increased recently, and sleeping ok with zolpidem alone.   Still able to work about 10 hrs per day Past Medical History  Diagnosis Date  . ALLERGIC RHINITIS 04/30/2007  . ANEMIA-NOS 07/12/2007  . ANXIETY 02/17/2009  . ARTHRALGIA 12/05/2007  . 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 CHEST PAIN 06/02/2009  . CHEST XRAY, ABNORMAL 08/09/2008  . DEPRESSIVE DISORDER 02/14/2009  . DYSPNEA 09/28/2008  . HYPERLIPIDEMIA 04/27/2007  . HYPERTENSION 04/27/2007  . INSOMNIA UNSPECIFIED 02/20/2010  . 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 MYOCARDIAL PERFUSION SCAN, WITH STRESS TEST, ABNORMAL 06/13/2009  . Nonspecific (abnormal) findings on radiological and other examination of body structure 08/09/2008  . Nonspecific abnormal toxicological findings 03/14/2008  .  OBESITY 04/30/2007  . OSTEOARTHRITIS 04/30/2007  . 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 PE 03/09/2008  . POLYARTHRITIS, INFLAMMATORY 12/07/2007  . Preventative health care 03/24/2011  . PROSTATITIS, HX OF 04/30/2007  . RESTRICTIVE LUNG DISEASE 06/20/2009  . TACHYCARDIA 03/14/2008  . 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 UNSPECIFIED MYALGIA AND MYOSITIS 02/14/2009   Past Surgical History  Procedure Date  . Appendectomy     reports that he has been smoking.  He does not have any smokeless tobacco history on file. He reports that he does not drink alcohol or use illicit drugs. family history includes Alcohol abuse in his other; Diabetes in his other; Heart disease in his other; Hypertension in his other; and Stroke in his other. Allergies  Allergen Reactions  . Atorvastatin     REACTION: myalgias  . Hydroxychloroquine Sulfate     REACTION: GI upset and HA   Current Outpatient Prescriptions on File Prior to Visit  Medication Sig Dispense Refill  . aspirin 81 MG tablet Take 81 mg by mouth daily.        . clonazePAM (KLONOPIN) 1 MG tablet take 1 tablet by mouth twice a day if needed for anxiety  60 tablet  0  . dextromethorphan-guaiFENesin (MUCINEX DM) 30-600 MG per 12 hr tablet Take 1 tablet by mouth every 12 (twelve) hours.        . Dextromethorphan-Guaifenesin (TUSSIN DM) 10-100 MG/5ML liquid Take 5 mLs by mouth every 12 (twelve) hours.        . folic acid (FOLVITE) 800 MCG tablet Take 400 mcg by mouth 2 (two) times daily.        Marland Kitchen  hydroxychloroquine (PLAQUENIL) 200 MG tablet Take by mouth as needed.        . leflunomide (ARAVA) 20 MG tablet Take 20 mg by mouth daily.        Marland Kitchen loperamide (IMODIUM A-D) 2 MG tablet Take 2 mg by mouth as needed.        Marland Kitchen losartan (COZAAR) 25 MG tablet Take 25 mg by mouth daily.        . methylPREDNISolone (MEDROL) 4 MG tablet Take 4 mg by mouth 2 (two) times daily.        . metoprolol tartrate (LOPRESSOR) 25 MG tablet Take 25 mg by mouth 2 (two) times daily.          . Misc Natural Products (OSTEO BI-FLEX ADV TRIPLE ST PO) Take by mouth 2 (two) times daily.        . Omega-3 Fatty Acids (FISH OIL) 1200 MG CAPS Take by mouth daily.        Marland Kitchen omeprazole (PRILOSEC) 20 MG capsule Take 20 mg by mouth daily.        Marland Kitchen zolpidem (AMBIEN CR) 12.5 MG CR tablet take 1 tablet by mouth at bedtime if needed  30 tablet  0   Review of Systems Review of Systems  Constitutional: Negative for diaphoresis, activity change, appetite change and unexpected weight change.  HENT: Negative for hearing loss, ear pain, facial swelling, mouth sores and neck stiffness.   Eyes: Negative for pain, redness and visual disturbance.  Respiratory: Negative for shortness of breath and wheezing.   Cardiovascular: Negative for chest pain and palpitations.  Gastrointestinal: Negative for diarrhea, blood in stool, abdominal distention and rectal pain.  Genitourinary: Negative for hematuria, flank pain and decreased urine volume.  Musculoskeletal: Negative for myalgias and joint swelling.  Skin: Negative for color change and wound.  Neurological: Negative for syncope and numbness.  Hematological: Negative for adenopathy.  Psychiatric/Behavioral: Negative for hallucinations, self-injury, decreased concentration and agitation.      Objective:   Physical Exam BP 100/64  Pulse 100  Temp(Src) 98.2 F (36.8 C) (Oral)  Ht 6' (1.829 m)  Wt 218 lb 2 oz (98.941 kg)  BMI 29.58 kg/m2  SpO2 94% Physical Exam  VS noted Constitutional: Pt is oriented to person, place, and time. Appears well-developed and well-nourished.  HENT:  Head: Normocephalic and atraumatic.  Right Ear: External ear normal.  Left Ear: External ear normal.  Nose: Nose normal.  Mouth/Throat: Oropharynx is clear and moist.  Eyes: Conjunctivae and EOM are normal. Pupils are equal, round, and reactive to light.  Neck: Normal range of motion. Neck supple. No JVD present. No tracheal deviation present.  Cardiovascular: Normal rate,  regular rhythm, normal heart sounds and intact distal pulses.   Pulmonary/Chest: Effort normal and breath sounds normal.  Abdominal: Soft. Bowel sounds are normal. There is no tenderness.  Musculoskeletal: Normal range of motion. Exhibits no edema.  Lymphadenopathy:  Has no cervical adenopathy.  Neurological: Pt is alert and oriented to person, place, and time. Pt has normal reflexes. No cranial nerve deficit.  Skin: Skin is warm and dry. No rash noted.  Psychiatric:  Has  normal mood and affect. Behavior is normal.  No active synovitis       Assessment & Plan:

## 2011-04-02 ENCOUNTER — Ambulatory Visit: Payer: BC Managed Care – PPO | Admitting: Cardiovascular Disease

## 2011-04-11 ENCOUNTER — Ambulatory Visit: Payer: BC Managed Care – PPO | Admitting: Cardiovascular Disease

## 2011-04-15 ENCOUNTER — Other Ambulatory Visit: Payer: Self-pay | Admitting: Internal Medicine

## 2011-04-20 ENCOUNTER — Other Ambulatory Visit: Payer: Self-pay | Admitting: Internal Medicine

## 2011-04-22 NOTE — Telephone Encounter (Signed)
Faxed hardcopy to [facility] [address]

## 2011-04-22 NOTE — Telephone Encounter (Signed)
Done hardcopy to dahlia/LIM B  

## 2011-05-03 LAB — POCT I-STAT, CHEM 8
Calcium, Ion: 1.06 — ABNORMAL LOW
Chloride: 105
Creatinine, Ser: 0.8
Glucose, Bld: 102 — ABNORMAL HIGH
Hemoglobin: 12.9 — ABNORMAL LOW
Potassium: 4.1

## 2011-05-03 LAB — DIFFERENTIAL
Basophils Relative: 0
Eosinophils Absolute: 0
Eosinophils Relative: 0
Lymphs Abs: 1.1
Neutrophils Relative %: 84 — ABNORMAL HIGH

## 2011-05-03 LAB — LUPUS ANTICOAGULANT PANEL
Lupus Anticoagulant: NOT DETECTED
dRVVT Incubated 1:1 Mix: 39.3 (ref 36.1–47.0)

## 2011-05-03 LAB — PROTEIN S ACTIVITY: Protein S Activity: 90 % (ref 69–129)

## 2011-05-03 LAB — PROTIME-INR
INR: 1.1
Prothrombin Time: 13.4
Prothrombin Time: 14
Prothrombin Time: 19.4 — ABNORMAL HIGH

## 2011-05-03 LAB — COMPREHENSIVE METABOLIC PANEL
Albumin: 3 — ABNORMAL LOW
Alkaline Phosphatase: 47
BUN: 11
CO2: 26
Chloride: 99
GFR calc non Af Amer: >60
Glucose, Bld: 145 — ABNORMAL HIGH
Potassium: 3.9
Total Bilirubin: 0.7

## 2011-05-03 LAB — APTT
aPTT: 31
aPTT: 44 — ABNORMAL HIGH

## 2011-05-03 LAB — LIPID PANEL
Cholesterol: 131
HDL: 31 — ABNORMAL LOW
LDL Cholesterol: 89
Total CHOL/HDL Ratio: 4.2
VLDL: 11

## 2011-05-03 LAB — CBC
HCT: 37.5 — ABNORMAL LOW
MCHC: 33
MCHC: 33.5
MCV: 85.5
Platelets: 359
RDW: 14.1
WBC: 10

## 2011-05-03 LAB — PROTEIN S, TOTAL: Protein S Ag, Total: 92 % (ref 70–140)

## 2011-05-03 LAB — TSH: TSH: 0.674

## 2011-05-03 LAB — POCT CARDIAC MARKERS: Myoglobin, poc: 52.5

## 2011-05-03 LAB — PROTEIN C ACTIVITY: Protein C Activity: 157 % — ABNORMAL HIGH (ref 75–133)

## 2011-05-03 LAB — CARDIAC PANEL(CRET KIN+CKTOT+MB+TROPI): Troponin I: <0.01

## 2011-05-03 LAB — HOMOCYSTEINE: Homocysteine: 14.1

## 2011-05-03 LAB — TROPONIN I: Troponin I: 0.01

## 2011-05-03 LAB — BETA-2-GLYCOPROTEIN I ABS, IGG/M/A: Beta-2-Glycoprotein I IgA: <4 U/mL (ref <10)

## 2011-05-03 LAB — ANTITHROMBIN III: AntiThromb III Func: 131 — ABNORMAL HIGH (ref 76–126)

## 2011-05-03 LAB — CARDIOLIPIN ANTIBODIES, IGG, IGM, IGA: Anticardiolipin IgG: 12 (ref <11)

## 2011-05-03 LAB — CK TOTAL AND CKMB (NOT AT ARMC): Relative Index: INVALID

## 2011-05-03 LAB — FACTOR 5 LEIDEN

## 2011-05-07 LAB — POCT I-STAT 3, VENOUS BLOOD GAS (G3P V)
Acid-Base Excess: 3 — ABNORMAL HIGH
Acid-Base Excess: 3 — ABNORMAL HIGH
Bicarbonate: 27.2 — ABNORMAL HIGH
O2 Saturation: 70
O2 Saturation: 76
TCO2: 28
TCO2: 29
pCO2, Ven: 42.1 — ABNORMAL LOW
pO2, Ven: 40

## 2011-05-10 ENCOUNTER — Ambulatory Visit (INDEPENDENT_AMBULATORY_CARE_PROVIDER_SITE_OTHER): Payer: BC Managed Care – PPO | Admitting: Cardiovascular Disease

## 2011-05-10 ENCOUNTER — Encounter: Payer: Self-pay | Admitting: Cardiovascular Disease

## 2011-05-10 VITALS — BP 128/88 | HR 88 | Resp 18 | Ht 72.0 in | Wt 217.0 lb

## 2011-05-10 DIAGNOSIS — I25110 Atherosclerotic heart disease of native coronary artery with unstable angina pectoris: Secondary | ICD-10-CM | POA: Insufficient documentation

## 2011-05-10 DIAGNOSIS — I2510 Atherosclerotic heart disease of native coronary artery without angina pectoris: Secondary | ICD-10-CM

## 2011-05-10 DIAGNOSIS — I712 Thoracic aortic aneurysm, without rupture: Secondary | ICD-10-CM

## 2011-05-10 HISTORY — DX: Atherosclerotic heart disease of native coronary artery with unstable angina pectoris: I25.110

## 2011-05-10 LAB — BASIC METABOLIC PANEL
Calcium: 8.8 mg/dL (ref 8.4–10.5)
Chloride: 106 mEq/L (ref 96–112)
Creatinine, Ser: 0.9 mg/dL (ref 0.4–1.5)

## 2011-05-10 NOTE — Progress Notes (Signed)
History of Present Illness:Mr. Withem is a pleasant 60 year old Caucasian male with a past medical history significant for hypertension, gastroesophageal reflux disease, prostatitis, thoracic aortic aneurysm and rheumatoid arthritis with pulmonary effusions and prior pulmonary embolism in July 2009, who was initially seen in our Cardiology office on March 31, 2008.  In August 2009, he was seen in our office for incidental finding of PE and  was tachycardic. A right and left heart cath demonstrated mild nonobstructive coronary artery disease and no evidence of constrictive physiology on the right heart catheterization. He had a normal stress test and echo in 2010. I last saw him in December 2010. Plans had been made for repeat chest CTA to follow aneurysm in thoracic aorta in 2010  but this was not done.   He tells me that he has been doing well. Occasional sharp chest pains. Overall feels well. Sometimes has chest pressure with stress. He has RA and has chronic pain issues.  Breathing is at baseline. No dizziness, near syncope or syncope.    Past Medical History  Diagnosis Date  . ALLERGIC RHINITIS 04/30/2007  . ANEMIA-NOS 07/12/2007  . ANXIETY 02/17/2009  . ARTHRALGIA 12/05/2007  . 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 CHEST PAIN 06/02/2009  . CHEST XRAY, ABNORMAL 08/09/2008  . DEPRESSIVE DISORDER 02/14/2009  . DYSPNEA 09/28/2008  . HYPERLIPIDEMIA 04/27/2007  . HYPERTENSION 04/27/2007  . INSOMNIA UNSPECIFIED 02/20/2010  . 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 MYOCARDIAL PERFUSION SCAN, WITH STRESS TEST, ABNORMAL 06/13/2009  . Nonspecific (abnormal) findings on radiological and other examination of body structure 08/09/2008  . Nonspecific abnormal toxicological findings 03/14/2008  . OBESITY 04/30/2007  . OSTEOARTHRITIS 04/30/2007  . 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 PE 03/09/2008  . POLYARTHRITIS, INFLAMMATORY 12/07/2007  . Preventative health care 03/24/2011  . PROSTATITIS, HX OF 04/30/2007  . RESTRICTIVE LUNG  DISEASE 06/20/2009  . TACHYCARDIA 03/14/2008  . 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 UNSPECIFIED MYALGIA AND MYOSITIS 02/14/2009    Past Surgical History  Procedure Date  . Appendectomy     Current Outpatient Prescriptions  Medication Sig Dispense Refill  . aspirin 81 MG tablet Take 81 mg by mouth daily.        . clonazePAM (KLONOPIN) 1 MG tablet take 1 tablet by mouth twice a day if needed for anxiety  60 tablet  2  . dextromethorphan-guaiFENesin (MUCINEX DM) 30-600 MG per 12 hr tablet Take 1 tablet by mouth every 12 (twelve) hours.        . Dextromethorphan-Guaifenesin (TUSSIN DM) 10-100 MG/5ML liquid Take 5 mLs by mouth every 12 (twelve) hours.        . folic acid (FOLVITE) 800 MCG tablet Take by mouth 2 (two) times daily.       . hydroxychloroquine (PLAQUENIL) 200 MG tablet Take by mouth as needed.        . leflunomide (ARAVA) 20 MG tablet Take 20 mg by mouth daily.        Marland Kitchen loperamide (IMODIUM A-D) 2 MG tablet Take 2 mg by mouth as needed.        Marland Kitchen losartan (COZAAR) 25 MG tablet TAKE 1 TABLET BY MOUTH ONCE DAILY  30 tablet  10  . methylPREDNISolone (MEDROL) 4 MG tablet Take 4 mg by mouth daily.       . metoprolol tartrate (LOPRESSOR) 25 MG tablet Take 25 mg by mouth 2 (two) times daily.        . Misc Natural Products (OSTEO BI-FLEX ADV TRIPLE ST PO) Take by  mouth 2 (two) times daily.        . Omega-3 Fatty Acids (FISH OIL) 1200 MG CAPS Take by mouth daily.        Marland Kitchen omeprazole (PRILOSEC) 20 MG capsule TAKE 1 CAPSULE BY MOUTH ONCE DAILY  30 capsule  10  . zolpidem (AMBIEN CR) 12.5 MG CR tablet take 1 tablet by mouth at bedtime if needed  30 tablet  5    Allergies  Allergen Reactions  . Atorvastatin     REACTION: myalgias  . Hydroxychloroquine Sulfate     REACTION: GI upset and HA    History   Social History  . Marital Status: Married    Spouse Name: N/A    Number of Children: N/A  . Years of Education: N/A   Occupational History  . Not on file.   Social History Main  Topics  . Smoking status: Current Everyday Smoker  . Smokeless tobacco: Not on file  . Alcohol Use: No  . Drug Use: No  . Sexually Active: Not on file   Other Topics Concern  . Not on file   Social History Narrative  . No narrative on file    Family History  Problem Relation Age of Onset  . Alcohol abuse Other   . Diabetes Other   . Hypertension Other   . Heart disease Other   . Stroke Other     Review of Systems:  As stated in the HPI and otherwise negative.   BP 128/88  Resp 18  Ht 6' (1.829 m)  Wt 217 lb (98.431 kg)  BMI 29.43 kg/m2  Physical Examination: General: Well developed, well nourished, NAD HEENT: OP clear, mucus membranes moist SKIN: warm, dry. No rashes. Neuro: No focal deficits Musculoskeletal: Muscle strength 5/5 all ext Psychiatric: Mood and affect normal Neck: No JVD, no carotid bruits, no thyromegaly, no lymphadenopathy. Lungs:Clear bilaterally, no wheezes, rhonci, crackles Cardiovascular: Regular rate and rhythm. No murmurs, gallops or rubs. Abdomen:Soft. Bowel sounds present. Non-tender.  Extremities: No lower extremity edema. Pulses are 2 + in the bilateral DP/PT.  EKG:NSR, rate 88 bpm. Normal EKG.

## 2011-05-10 NOTE — Assessment & Plan Note (Addendum)
Stable. He had mild CAD by cath 2009, normal stress test 2010. No ischemic w/u at this time. Continue medical therapy. He has not tolerated statins in the past.

## 2011-05-10 NOTE — Assessment & Plan Note (Signed)
Will get chest CTA to size thoracic aorta. He missed his appt for this in 2010.

## 2011-05-10 NOTE — Patient Instructions (Addendum)
Your physician wants you to follow-up in: 12 months.  You will receive a reminder letter in the mail two months in advance. If you don't receive a letter, please call our office to schedule the follow-up appointment.  Non-Cardiac CT Angiography (CTA), is a special type of CT scan that uses a computer to produce multi-dimensional views of major blood vessels throughout the body. In CT angiography, a contrast material is injected through an IV to help visualize the blood vessels  You had lab work done today.

## 2011-05-30 ENCOUNTER — Encounter: Payer: Self-pay | Admitting: *Deleted

## 2011-06-11 ENCOUNTER — Telehealth: Payer: Self-pay | Admitting: Cardiovascular Disease

## 2011-06-11 ENCOUNTER — Ambulatory Visit (INDEPENDENT_AMBULATORY_CARE_PROVIDER_SITE_OTHER)
Admission: RE | Admit: 2011-06-11 | Discharge: 2011-06-11 | Disposition: A | Discharge status: Home / Self Care | Payer: BC Managed Care – PPO | Source: Ambulatory Visit | Attending: Cardiovascular Disease | Admitting: Cardiovascular Disease

## 2011-06-11 DIAGNOSIS — I712 Thoracic aortic aneurysm, without rupture: Secondary | ICD-10-CM

## 2011-06-11 MED ORDER — IOHEXOL 300 MG/ML  SOLN
80.0000 mL | Freq: Once | INTRAMUSCULAR | Status: AC | PRN
  Administered 2011-06-11: 80 mL via INTRAVENOUS

## 2011-06-11 NOTE — Progress Notes (Signed)
NA

## 2011-06-11 NOTE — Progress Notes (Signed)
N/A.  LMTC on both numbers.

## 2011-06-11 NOTE — Telephone Encounter (Signed)
Pt returning your call

## 2011-06-11 NOTE — Telephone Encounter (Signed)
I talked with pt about CT results from today.

## 2011-07-11 ENCOUNTER — Ambulatory Visit (INDEPENDENT_AMBULATORY_CARE_PROVIDER_SITE_OTHER): Payer: BC Managed Care – PPO | Admitting: Internal Medicine

## 2011-07-11 ENCOUNTER — Encounter: Payer: Self-pay | Admitting: Internal Medicine

## 2011-07-11 VITALS — BP 110/78 | HR 86 | Temp 98.5°F | Ht 72.0 in | Wt 222.5 lb

## 2011-07-11 DIAGNOSIS — F411 Generalized anxiety disorder: Secondary | ICD-10-CM

## 2011-07-11 DIAGNOSIS — F3289 Other specified depressive episodes: Secondary | ICD-10-CM

## 2011-07-11 DIAGNOSIS — I10 Essential (primary) hypertension: Secondary | ICD-10-CM

## 2011-07-11 DIAGNOSIS — F329 Major depressive disorder, single episode, unspecified: Secondary | ICD-10-CM

## 2011-07-11 NOTE — Patient Instructions (Signed)
OK to continue all medications as is for now Please check your blood pressure on a regular basis;  Your goal is to be less than 140/90 Please call in 2 wks with your results

## 2011-07-14 ENCOUNTER — Encounter: Payer: Self-pay | Admitting: Internal Medicine

## 2011-07-14 NOTE — Assessment & Plan Note (Signed)
stable overall by hx and exam, most recent data reviewed with pt, and pt to continue medical treatment as before  Lab Results  Component Value Date   WBC 8.3 03/28/2011   HGB 13.6 03/28/2011   HCT 41.1 03/28/2011   PLT 265.0 03/28/2011   GLUCOSE 94 05/10/2011   CHOL 168 03/28/2011   TRIG 149.0 03/28/2011   HDL 32.40* 03/28/2011   LDLCALC 106* 03/28/2011   ALT 42 03/28/2011   AST 25 03/28/2011   NA 141 05/10/2011   K 3.8 05/10/2011   CL 106 05/10/2011   CREATININE 0.9 05/10/2011   BUN 15 05/10/2011   CO2 25 05/10/2011   TSH 0.97 03/28/2011   PSA 0.61 03/28/2011   INR 1.5 RATIO* 05/31/2008    

## 2011-07-14 NOTE — Assessment & Plan Note (Signed)
Ongoing stressors, declines referral for cousneling or SSRI trial at this itme

## 2011-07-14 NOTE — Assessment & Plan Note (Signed)
I suspect situational, o/w stable overall by hx and exam, most recent data reviewed with pt, and pt to continue medical treatment as before, and pt to call with BP's at home in 2 wks  BP Readings from Last 3 Encounters:  07/11/11 110/78  05/10/11 128/88  03/28/11 100/64

## 2011-07-14 NOTE — Progress Notes (Signed)
Subjective:    Patient ID: [name] Michael, male    DOB: 12/21/1950, 60 y.o.   MRN: [id number]  HPI  Here to f/u; overall doing ok, but has headache typical for migraine with throbbing, severe, nausea, photophobia, persistent for 3-4 days, better today, but did have to go to [employer] physical yesterday with finding of elev BP. Pt denies chest pain, increased sob or doe, wheezing, orthopnea, PND, increased LE swelling, palpitations, dizziness or syncope.  Pt denies new neurological symptoms such as new headache, or facial or extremity weakness or numbness except for the above.   Pt denies polydipsia, polyuria.   Pt denies fever, wt loss, night sweats, loss of appetite, or other constitutional symptoms No other acute complaints.  Denies worsening depressive symptoms, suicidal ideation, or panic, though has ongoing anxiety, not worse per pt though has ongoing stressors. Past Medical History  Diagnosis Date  . ALLERGIC RHINITIS 04/30/2007  . ANEMIA-NOS 07/12/2007  . ANXIETY 02/17/2009  . ARTHRALGIA 12/05/2007  . 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 CHEST PAIN 06/02/2009  . CHEST XRAY, ABNORMAL 08/09/2008  . DEPRESSIVE DISORDER 02/14/2009  . DYSPNEA 09/28/2008  . HYPERLIPIDEMIA 04/27/2007  . HYPERTENSION 04/27/2007  . INSOMNIA UNSPECIFIED 02/20/2010  . 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 MYOCARDIAL PERFUSION SCAN, WITH STRESS TEST, ABNORMAL 06/13/2009  . Nonspecific (abnormal) findings on radiological and other examination of body structure 08/09/2008  . Nonspecific abnormal toxicological findings 03/14/2008  . OBESITY 04/30/2007  . OSTEOARTHRITIS 04/30/2007  . 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 PE 03/09/2008  . POLYARTHRITIS, INFLAMMATORY 12/07/2007  . Preventative health care 03/24/2011  . PROSTATITIS, HX OF 04/30/2007  . RESTRICTIVE LUNG DISEASE 06/20/2009  . TACHYCARDIA 03/14/2008  . 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 UNSPECIFIED MYALGIA AND MYOSITIS 02/14/2009   Past Surgical History  Procedure Date  .  Appendectomy     reports that he has never smoked. He does not have any smokeless tobacco history on file. He reports that he does not drink alcohol or use illicit drugs. family history includes Alcohol abuse in his other; Diabetes in his other; Heart disease in his other; Hypertension in his other; and Stroke in his other. Allergies  Allergen Reactions  . Atorvastatin     REACTION: myalgias  . Hydroxychloroquine Sulfate     REACTION: GI upset and HA   Current Outpatient Prescriptions on File Prior to Visit  Medication Sig Dispense Refill  . aspirin 81 MG tablet Take 81 mg by mouth daily.        . clonazePAM (KLONOPIN) 1 MG tablet take 1 tablet by mouth twice a day if needed for anxiety  60 tablet  2  . dextromethorphan-guaiFENesin (MUCINEX DM) 30-600 MG per 12 hr tablet Take 1 tablet by mouth every 12 (twelve) hours.        . Dextromethorphan-Guaifenesin (TUSSIN DM) 10-100 MG/5ML liquid Take 5 mLs by mouth every 12 (twelve) hours.        . folic acid (FOLVITE) 800 MCG tablet Take by mouth 2 (two) times daily.       . hydroxychloroquine (PLAQUENIL) 200 MG tablet Take by mouth as needed.        . leflunomide (ARAVA) 20 MG tablet Take 20 mg by mouth daily.        Marland Kitchen loperamide (IMODIUM A-D) 2 MG tablet Take 2 mg by mouth as needed.        Marland Kitchen losartan (COZAAR) 25 MG tablet TAKE 1 TABLET BY MOUTH ONCE DAILY  30 tablet  10  .  methylPREDNISolone (MEDROL) 4 MG tablet Take 4 mg by mouth daily.       . metoprolol tartrate (LOPRESSOR) 25 MG tablet Take 25 mg by mouth 2 (two) times daily.        . Misc Natural Products (OSTEO BI-FLEX ADV TRIPLE ST PO) Take by mouth 2 (two) times daily.        . Omega-3 Fatty Acids (FISH OIL) 1200 MG CAPS Take by mouth daily.        Marland Kitchen omeprazole (PRILOSEC) 20 MG capsule TAKE 1 CAPSULE BY MOUTH ONCE DAILY  30 capsule  10  . zolpidem (AMBIEN CR) 12.5 MG CR tablet take 1 tablet by mouth at bedtime if needed  30 tablet  5   Review of Systems Review of Systems    Constitutional: Negative for diaphoresis and unexpected weight change.  HENT: Negative for drooling and tinnitus.   Eyes: Negative for photophobia and visual disturbance.  Respiratory: Negative for choking and stridor.   Gastrointestinal: Negative for vomiting and blood in stool.  Genitourinary: Negative for hematuria and decreased urine volume.  Musculoskeletal: Negative for gait problem.    Objective:   Physical Exam BP 110/78  Pulse 86  Temp(Src) 98.5 F (36.9 C) (Oral)  Ht 6' (1.829 m)  Wt 222 lb 8 oz (100.925 kg)  BMI 30.18 kg/m2  SpO2 95% Physical Exam  VS noted Constitutional: Pt appears well-developed and well-nourished.  HENT: Head: Normocephalic.  Right Ear: External ear normal.  Left Ear: External ear normal.  Eyes: Conjunctivae and EOM are normal. Pupils are equal, round, and reactive to light.  Neck: Normal range of motion. Neck supple.  Cardiovascular: Normal rate and regular rhythm.   Pulmonary/Chest: Effort normal and breath sounds normal.  Abd:  Soft, NT, non-distended, + BS Neurological: Pt is alert. No cranial nerve deficit. motor/sens/dtr intact, gait intact Skin: Skin is warm. No erythema.  Psychiatric: Pt behavior is normal. Thought content normal. 1+ nervous    Assessment & Plan:

## 2011-07-17 ENCOUNTER — Encounter: Payer: Self-pay | Admitting: Internal Medicine

## 2011-07-17 ENCOUNTER — Ambulatory Visit (INDEPENDENT_AMBULATORY_CARE_PROVIDER_SITE_OTHER): Payer: BC Managed Care – PPO | Admitting: Internal Medicine

## 2011-07-17 VITALS — BP 102/80 | HR 96 | Temp 98.9°F | Ht 72.0 in | Wt 221.4 lb

## 2011-07-17 DIAGNOSIS — I2510 Atherosclerotic heart disease of native coronary artery without angina pectoris: Secondary | ICD-10-CM

## 2011-07-17 DIAGNOSIS — F329 Major depressive disorder, single episode, unspecified: Secondary | ICD-10-CM

## 2011-07-17 DIAGNOSIS — I10 Essential (primary) hypertension: Secondary | ICD-10-CM

## 2011-07-17 MED ORDER — LOSARTAN POTASSIUM 50 MG PO TABS: 50.0000 mg | ORAL_TABLET | Freq: Every day | ORAL | Status: DC

## 2011-07-17 NOTE — Patient Instructions (Signed)
Please increase the losartan to 50 mg per day Continue all other medications as before Please return in 2 weeks, and please bring your blood pressure cuff next time

## 2011-07-19 ENCOUNTER — Other Ambulatory Visit: Payer: Self-pay | Admitting: Internal Medicine

## 2011-07-21 ENCOUNTER — Encounter: Payer: Self-pay | Admitting: Internal Medicine

## 2011-07-21 NOTE — Assessment & Plan Note (Signed)
stable overall by hx and exam, most recent data reviewed with pt, and pt to continue medical treatment as before, declines need for SSRI at this time or similar

## 2011-07-21 NOTE — Assessment & Plan Note (Signed)
stable overall by hx and exam, and pt to continue medical treatment as before 

## 2011-07-21 NOTE — Progress Notes (Signed)
Subjective:    Patient ID: [name] Michael, male    DOB: 1950-09-15, 60 y.o.   MRN: [id number]  HPI  Here to f/u; overall doing ok,  Pt denies chest pain, increased sob or doe, wheezing, orthopnea, PND, increased LE swelling, palpitations, dizziness or syncope.  Pt denies new neurological symptoms such as new headache, or facial or extremity weakness or numbness   Pt denies polydipsia, polyuria  Pt states overall good compliance with meds, trying to follow lower cholesterol diet, wt overall stable but little exercise however, and BP at home by his cuff (did not bring with him) is more often than not in the 140-150"s SBP.  Denies worsening depressive symptoms, suicidal ideation, or panic, though has ongoing anxiety.  RA pain is overall stable recently, followed per Dr Christy Gentles.  No other new complaints Past Medical History  Diagnosis Date  . ALLERGIC RHINITIS 04/30/2007  . ANEMIA-NOS 07/12/2007  . ANXIETY 02/17/2009  . ARTHRALGIA 12/05/2007  . 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 CHEST PAIN 06/02/2009  . CHEST XRAY, ABNORMAL 08/09/2008  . DEPRESSIVE DISORDER 02/14/2009  . DYSPNEA 09/28/2008  . HYPERLIPIDEMIA 04/27/2007  . HYPERTENSION 04/27/2007  . INSOMNIA UNSPECIFIED 02/20/2010  . 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 MYOCARDIAL PERFUSION SCAN, WITH STRESS TEST, ABNORMAL 06/13/2009  . Nonspecific (abnormal) findings on radiological and other examination of body structure 08/09/2008  . Nonspecific abnormal toxicological findings 03/14/2008  . OBESITY 04/30/2007  . OSTEOARTHRITIS 04/30/2007  . 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 PE 03/09/2008  . POLYARTHRITIS, INFLAMMATORY 12/07/2007  . Preventative health care 03/24/2011  . PROSTATITIS, HX OF 04/30/2007  . RESTRICTIVE LUNG DISEASE 06/20/2009  . TACHYCARDIA 03/14/2008  . 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 UNSPECIFIED MYALGIA AND MYOSITIS 02/14/2009   Past Surgical History  Procedure Date  . Appendectomy     reports that he has never smoked. He does not  have any smokeless tobacco history on file. He reports that he does not drink alcohol or use illicit drugs. family history includes Alcohol abuse in his other; Diabetes in his other; Heart disease in his other; Hypertension in his other; and Stroke in his other. Allergies  Allergen Reactions  . Atorvastatin     REACTION: myalgias  . Hydroxychloroquine Sulfate     REACTION: GI upset and HA   Current Outpatient Prescriptions on File Prior to Visit  Medication Sig Dispense Refill  . aspirin 81 MG tablet Take 81 mg by mouth daily.        . clonazePAM (KLONOPIN) 1 MG tablet take 1 tablet by mouth twice a day if needed for anxiety  60 tablet  2  . dextromethorphan-guaiFENesin (MUCINEX DM) 30-600 MG per 12 hr tablet Take 1 tablet by mouth every 12 (twelve) hours.        . Dextromethorphan-Guaifenesin (TUSSIN DM) 10-100 MG/5ML liquid Take 5 mLs by mouth every 12 (twelve) hours.        . folic acid (FOLVITE) 800 MCG tablet Take by mouth 2 (two) times daily.       . hydroxychloroquine (PLAQUENIL) 200 MG tablet Take by mouth as needed.        . leflunomide (ARAVA) 20 MG tablet Take 20 mg by mouth daily.        Marland Kitchen loperamide (IMODIUM A-D) 2 MG tablet Take 2 mg by mouth as needed.        . methylPREDNISolone (MEDROL) 4 MG tablet Take 4 mg by mouth daily.       . metoprolol tartrate (LOPRESSOR)  25 MG tablet Take 25 mg by mouth 2 (two) times daily.        . Misc Natural Products (OSTEO BI-FLEX ADV TRIPLE ST PO) Take by mouth 2 (two) times daily.        . Omega-3 Fatty Acids (FISH OIL) 1200 MG CAPS Take by mouth daily.        Marland Kitchen omeprazole (PRILOSEC) 20 MG capsule TAKE 1 CAPSULE BY MOUTH ONCE DAILY  30 capsule  10  . zolpidem (AMBIEN CR) 12.5 MG CR tablet take 1 tablet by mouth at bedtime if needed  30 tablet  5   Review of Systems Review of Systems  Constitutional: Negative for diaphoresis and unexpected weight change.  HENT: Negative for drooling and tinnitus.   Eyes: Negative for photophobia and  visual disturbance.  Respiratory: Negative for choking and stridor.   Gastrointestinal: Negative for vomiting and blood in stool.  Genitourinary: Negative for hematuria and decreased urine volume.  Musculoskeletal: Negative for gait problem.    Objective:   Physical Exam BP 102/80  Pulse 96  Temp(Src) 98.9 F (37.2 C) (Oral)  Ht 6' (1.829 m)  Wt 221 lb 6 oz (100.415 kg)  BMI 30.02 kg/m2  SpO2 93% Physical Exam  VS noted, not ill appearing No active synovitis Constitutional: Pt appears well-developed and well-nourished.  HENT: Head: Normocephalic.  Right Ear: External ear normal.  Left Ear: External ear normal.  Eyes: Conjunctivae and EOM are normal. Pupils are equal, round, and reactive to light.  Neck: Normal range of motion. Neck supple.  Cardiovascular: Normal rate and regular rhythm.   Pulmonary/Chest: Effort normal and breath sounds normal.  Neurological: Pt is alert. No cranial nerve deficit.  Skin: Skin is warm. No erythema.  Psychiatric: Pt behavior is normal. Thought content normal. not depressed affect, 1+ nervous    Assessment & Plan:

## 2011-07-21 NOTE — Assessment & Plan Note (Signed)
?   Mild uncontrolled; ok to incr the cozaar to 50 mg, pt to cont monitor BP at home, f/u in 2 wks and bring his cuff to assess  Lab Results  Component Value Date   WBC 8.3 03/28/2011   HGB 13.6 03/28/2011   HCT 41.1 03/28/2011   PLT 265.0 03/28/2011   GLUCOSE 94 05/10/2011   CHOL 168 03/28/2011   TRIG 149.0 03/28/2011   HDL 32.40* 03/28/2011   LDLCALC 106* 03/28/2011   ALT 42 03/28/2011   AST 25 03/28/2011   NA 141 05/10/2011   K 3.8 05/10/2011   CL 106 05/10/2011   CREATININE 0.9 05/10/2011   BUN 15 05/10/2011   CO2 25 05/10/2011   TSH 0.97 03/28/2011   PSA 0.61 03/28/2011   INR 1.5 RATIO* 05/31/2008

## 2011-07-22 ENCOUNTER — Other Ambulatory Visit: Payer: Self-pay

## 2011-07-22 MED ORDER — METOPROLOL TARTRATE 25 MG PO TABS: 25.0000 mg | ORAL_TABLET | Freq: Two times a day (BID) | ORAL | Status: DC

## 2011-07-22 NOTE — Telephone Encounter (Signed)
.   Requested Prescriptions   Signed Prescriptions Disp Refills  . metoprolol tartrate (LOPRESSOR) 25 MG tablet 60 tablet 11    Sig: Take 1 tablet (25 mg total) by mouth 2 (two) times daily.    Authorizing Provider: Tonny Bollman D    Ordering User: Lacie Scotts   E-scribe to [facility].

## 2011-08-01 ENCOUNTER — Ambulatory Visit (INDEPENDENT_AMBULATORY_CARE_PROVIDER_SITE_OTHER): Payer: BC Managed Care – PPO | Admitting: Internal Medicine

## 2011-08-01 ENCOUNTER — Encounter: Payer: Self-pay | Admitting: Internal Medicine

## 2011-08-01 VITALS — BP 100/68 | HR 91 | Temp 98.3°F | Ht 72.0 in | Wt 224.4 lb

## 2011-08-01 DIAGNOSIS — J019 Acute sinusitis, unspecified: Secondary | ICD-10-CM | POA: Insufficient documentation

## 2011-08-01 DIAGNOSIS — J069 Acute upper respiratory infection, unspecified: Secondary | ICD-10-CM | POA: Insufficient documentation

## 2011-08-01 DIAGNOSIS — I10 Essential (primary) hypertension: Secondary | ICD-10-CM

## 2011-08-01 DIAGNOSIS — I2510 Atherosclerotic heart disease of native coronary artery without angina pectoris: Secondary | ICD-10-CM

## 2011-08-01 DIAGNOSIS — F411 Generalized anxiety disorder: Secondary | ICD-10-CM

## 2011-08-01 MED ORDER — LOSARTAN POTASSIUM 25 MG PO TABS: ORAL_TABLET | ORAL | Status: DC

## 2011-08-01 MED ORDER — LEVOFLOXACIN 250 MG PO TABS: 250.0000 mg | ORAL_TABLET | Freq: Every day | ORAL | Status: DC

## 2011-08-01 MED ORDER — HYDROCODONE-HOMATROPINE 5-1.5 MG/5ML PO SYRP: 5.0000 mL | ORAL_SOLUTION | Freq: Four times a day (QID) | ORAL | Status: DC | PRN

## 2011-08-01 NOTE — Progress Notes (Signed)
Subjective:    Patient ID: [name] Michael, male    DOB: 03/14/1951, 59 y.o.   MRN: [id number]  HPI  Here to f/u;  Here to f/u; overall doing ok,  Pt denies chest pain, increased sob or doe, wheezing, orthopnea, PND, increased LE swelling, palpitations, dizziness or syncope.  Pt denies new neurological symptoms such as new headache, or facial or extremity weakness or numbness   Pt denies polydipsia, polyuria.  Has mult BP documented with sbp ave < 140/90, better in the later afternoon overall, but no longer seems mild elevated except for the last 2 days with onset URI symptoms and ST, congestion without HA, prod cough, sob or wheezing.  Brings his BP machine, for some reason it does not work here today. Denies worsening depressive symptoms, suicidal ideation, or panic, though has ongoing anxiety.  Pt denies fever, wt loss, night sweats, loss of appetite, or other constitutional symptoms except for the last 3 days Past Medical History  Diagnosis Date  . ALLERGIC RHINITIS 04/30/2007  . ANEMIA-NOS 07/12/2007  . ANXIETY 02/17/2009  . ARTHRALGIA 12/05/2007  . 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 CHEST PAIN 06/02/2009  . CHEST XRAY, ABNORMAL 08/09/2008  . DEPRESSIVE DISORDER 02/14/2009  . DYSPNEA 09/28/2008  . HYPERLIPIDEMIA 04/27/2007  . HYPERTENSION 04/27/2007  . INSOMNIA UNSPECIFIED 02/20/2010  . 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 MYOCARDIAL PERFUSION SCAN, WITH STRESS TEST, ABNORMAL 06/13/2009  . Nonspecific (abnormal) findings on radiological and other examination of body structure 08/09/2008  . Nonspecific abnormal toxicological findings 03/14/2008  . OBESITY 04/30/2007  . OSTEOARTHRITIS 04/30/2007  . 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 PE 03/09/2008  . POLYARTHRITIS, INFLAMMATORY 12/07/2007  . Preventative health care 03/24/2011  . PROSTATITIS, HX OF 04/30/2007  . RESTRICTIVE LUNG DISEASE 06/20/2009  . TACHYCARDIA 03/14/2008  . 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 UNSPECIFIED MYALGIA AND MYOSITIS 02/14/2009    Past Surgical History  Procedure Date  . Appendectomy     reports that he has never smoked. He does not have any smokeless tobacco history on file. He reports that he does not drink alcohol or use illicit drugs. family history includes Alcohol abuse in his other; Diabetes in his other; Heart disease in his other; Hypertension in his other; and Stroke in his other. Allergies  Allergen Reactions  . Atorvastatin     REACTION: myalgias  . Hydroxychloroquine Sulfate     REACTION: GI upset and HA   Current Outpatient Prescriptions on File Prior to Visit  Medication Sig Dispense Refill  . aspirin 81 MG tablet Take 81 mg by mouth daily.        . clonazePAM (KLONOPIN) 1 MG tablet take 1 tablet by mouth twice a day if needed for anxiety  60 tablet  2  . dextromethorphan-guaiFENesin (MUCINEX DM) 30-600 MG per 12 hr tablet Take 1 tablet by mouth every 12 (twelve) hours.        . Dextromethorphan-Guaifenesin (TUSSIN DM) 10-100 MG/5ML liquid Take 5 mLs by mouth every 12 (twelve) hours.        . folic acid (FOLVITE) 800 MCG tablet Take by mouth 2 (two) times daily.       . hydroxychloroquine (PLAQUENIL) 200 MG tablet Take by mouth as needed.        . leflunomide (ARAVA) 20 MG tablet Take 20 mg by mouth daily.        Marland Kitchen loperamide (IMODIUM A-D) 2 MG tablet Take 2 mg by mouth as needed.        Marland Kitchen  methylPREDNISolone (MEDROL) 4 MG tablet Take 4 mg by mouth daily.       . metoprolol tartrate (LOPRESSOR) 25 MG tablet Take 1 tablet (25 mg total) by mouth 2 (two) times daily.  60 tablet  11  . Misc Natural Products (OSTEO BI-FLEX ADV TRIPLE ST PO) Take by mouth 2 (two) times daily.        . Omega-3 Fatty Acids (FISH OIL) 1200 MG CAPS Take by mouth daily.        Marland Kitchen omeprazole (PRILOSEC) 20 MG capsule TAKE 1 CAPSULE BY MOUTH ONCE DAILY  30 capsule  10  . zolpidem (AMBIEN CR) 12.5 MG CR tablet take 1 tablet by mouth at bedtime if needed  30 tablet  5    Review of Systems Review of Systems  Constitutional:  Negative for diaphoresis and unexpected weight change.  HENT: Negative for drooling and tinnitus.   Eyes: Negative for photophobia and visual disturbance.  Respiratory: Negative for choking and stridor.   Gastrointestinal: Negative for vomiting and blood in stool.  Genitourinary: Negative for hematuria and decreased urine volume.     Objective:   Physical Exam BP 100/68  Pulse 91  Temp(Src) 98.3 F (36.8 C) (Oral)  Ht 6' (1.829 m)  Wt 224 lb 6 oz (101.776 kg)  BMI 30.43 kg/m2  SpO2 94% Physical Exam  VS noted, not ill appearing Constitutional: Pt appears well-developed and well-nourished.  HENT: Head: Normocephalic.  Right Ear: External ear normal.  Left Ear: External ear normal.  Bilat tm's mild erythema.  Sinus nontender.  Pharynx mild erythema Eyes: Conjunctivae and EOM are normal. Pupils are equal, round, and reactive to light.  Neck: Normal range of motion. Neck supple.  Cardiovascular: Normal rate and regular rhythm.   Pulmonary/Chest: Effort normal and breath sounds normal.  Neurological: Pt is alert. No cranial nerve deficit.  Skin: Skin is warm. No erythema. Motor/dtr intact Psychiatric: Pt behavior is normal. Thought content normal.1+ nervous     Assessment & Plan:

## 2011-08-01 NOTE — Assessment & Plan Note (Signed)
Incidental, mild viral most likely, for mucinex otc prn,  to f/u any worsening symptoms or concerns, to stay hydrated, tylenol prn

## 2011-08-01 NOTE — Patient Instructions (Signed)
Take all new medications as prescribed - the total of 75 mg per day of the losartan Continue all other medications as before

## 2011-08-01 NOTE — Assessment & Plan Note (Signed)
stable overall by hx and exam, most recent data reviewed with pt, and pt to continue medical treatment as before  Lab Results  Component Value Date   WBC 8.3 03/28/2011   HGB 13.6 03/28/2011   HCT 41.1 03/28/2011   PLT 265.0 03/28/2011   GLUCOSE 94 05/10/2011   CHOL 168 03/28/2011   TRIG 149.0 03/28/2011   HDL 32.40* 03/28/2011   LDLCALC 106* 03/28/2011   ALT 42 03/28/2011   AST 25 03/28/2011   NA 141 05/10/2011   K 3.8 05/10/2011   CL 106 05/10/2011   CREATININE 0.9 05/10/2011   BUN 15 05/10/2011   CO2 25 05/10/2011   TSH 0.97 03/28/2011   PSA 0.61 03/28/2011   INR 1.5 RATIO* 05/31/2008

## 2011-08-01 NOTE — Assessment & Plan Note (Signed)
stable overall by hx and exam, , and pt to continue medical treatment as before   

## 2011-08-01 NOTE — Assessment & Plan Note (Addendum)
Improved with losartan to 75 mg, Continue all other medications as before,  to f/u any worsening symptoms or concerns  BP Readings from Last 3 Encounters:  08/01/11 100/68  07/17/11 102/80  07/11/11 110/78

## 2011-10-03 ENCOUNTER — Ambulatory Visit (INDEPENDENT_AMBULATORY_CARE_PROVIDER_SITE_OTHER): Payer: BC Managed Care – PPO | Admitting: Physician Assistant

## 2011-10-03 ENCOUNTER — Encounter: Payer: Self-pay | Admitting: Physician Assistant

## 2011-10-03 ENCOUNTER — Encounter: Payer: Self-pay | Admitting: *Deleted

## 2011-10-03 VITALS — BP 130/88 | HR 78 | Ht 72.0 in | Wt 217.8 lb

## 2011-10-03 DIAGNOSIS — R5381 Other malaise: Secondary | ICD-10-CM

## 2011-10-03 DIAGNOSIS — I10 Essential (primary) hypertension: Secondary | ICD-10-CM

## 2011-10-03 DIAGNOSIS — R079 Chest pain, unspecified: Secondary | ICD-10-CM

## 2011-10-03 DIAGNOSIS — R5383 Other fatigue: Secondary | ICD-10-CM | POA: Insufficient documentation

## 2011-10-03 DIAGNOSIS — I712 Thoracic aortic aneurysm, without rupture: Secondary | ICD-10-CM

## 2011-10-03 DIAGNOSIS — I2510 Atherosclerotic heart disease of native coronary artery without angina pectoris: Secondary | ICD-10-CM

## 2011-10-03 DIAGNOSIS — F411 Generalized anxiety disorder: Secondary | ICD-10-CM

## 2011-10-03 LAB — CBC WITH DIFFERENTIAL/PLATELET
Eosinophils Relative: 1.3 % (ref 0.0–5.0)
HCT: 42.6 % (ref 39.0–52.0)
Hemoglobin: 13.8 g/dL (ref 13.0–17.0)
Lymphs Abs: 2 10*3/uL (ref 0.7–4.0)
Monocytes Relative: 9.2 % (ref 3.0–12.0)
Neutro Abs: 6.4 10*3/uL (ref 1.4–7.7)
WBC: 9.5 10*3/uL (ref 4.5–10.5)

## 2011-10-03 LAB — BASIC METABOLIC PANEL
GFR: 98.2 mL/min (ref >60.00)
Potassium: 4.1 mEq/L (ref 3.5–5.1)
Sodium: 141 mEq/L (ref 135–145)

## 2011-10-03 LAB — TSH: TSH: 0.85 u[IU]/mL (ref 0.35–5.50)

## 2011-10-03 NOTE — Assessment & Plan Note (Signed)
Stable by recent CT scan.

## 2011-10-03 NOTE — Progress Notes (Signed)
[address] [place], [place]  [address] Phone: [phone] Fax:  [phone]  Date:  10/03/2011   Name:  [name] Michael       DOB:  [birth year] MRN:  [id number]  PCP:  Dr. Oliver Barre Primary Cardiologist:  Dr. Verne Carrow  Primary Electrophysiologist:  None    History of Present Illness: [name] Michael is a 61 y.o. male who presents for BP, chest pain.  He has a history of hypertension, GERD, prostatitis, thoracic aortic aneurysm, rheumatoid arthritis with pulmonary effusions and prior pulmonary embolism in 7/09.  LHC 11/09 proximal RCA 30%, mid RCA 30%, distal RCA with serial 30-40%, EF 40-45%, normal right heart pressures.  Echo 11/10 mild LVH, EF 55-65%, grade 1 diastolic dysfunction, aortic root moderately dilated, mild LAE.  Chest CT 11/12: ascending thoracic aortic aneurysm stable at 42 mm.  Myoview 11/10: Small lateral scar, no ischemia, EF 61%.  He was last seen by Dr. Verne Carrow 10/12.  At that time he had some atypical chest pain.  No further workup was recommended.  Plan was for one-year follow up.  He noted a fluctuation in his blood pressure earlier this week.  Several weeks ago, he saw his PCP who adjusted his antihypertensives for better blood pressure control.  His pressure earlier this week was as high as 154/111.  He had a headache with this.  Of note, he had recently eaten at [employer] 1-2 days prior.  He also had eaten at [employer] that morning.  He often takes Motrin.  He sometimes takes an over-the-counter cold medication for sinus congestion.  He has chronic chest pain.  He thinks it may have been worse over the last several days.  He typically notes it with positional changes.  He denies exertional chest discomfort.  He denies significant dyspnea with exertion.  He denies orthopnea, PND or edema.  He denies syncope.  He denies near syncope.  He denies pleuritic chest pain.  He denies hemoptysis.  He does drive a  truck.  Past Medical History  Diagnosis Date  . ALLERGIC RHINITIS 04/30/2007  . ANEMIA-NOS 07/12/2007  . ANXIETY 02/17/2009  . 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 DEPRESSIVE DISORDER 02/14/2009  . HYPERLIPIDEMIA 04/27/2007  . HYPERTENSION 04/27/2007  . INSOMNIA UNSPECIFIED 02/20/2010  . 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 Nonspecific abnormal toxicological findings 03/14/2008  . OBESITY 04/30/2007  . OSTEOARTHRITIS 04/30/2007  . 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 History of pulmonary embolism 03/09/2008  . PROSTATITIS, HX OF 04/30/2007  . RESTRICTIVE LUNG DISEASE 06/20/2009  . THORACIC AORTIC ANEURYSM 07/06/2009    Chest CT 11/12: ascending thoracic aortic aneurysm stable at 42 mm.   Marland Kitchen UNSPECIFIED MYALGIA AND MYOSITIS 02/14/2009  . CAD (coronary artery disease)     LHC 11/09 proximal RCA 30%, mid RCA 30%, distal RCA with serial 30-40%, EF 40-45%, normal right heart pressures.  Echo 11/10 mild LVH, EF 55-65%, grade 1 diastolic dysfunction, aortic root moderately dilated, mild LAE.;  Myoview 11/10: Small lateral scar, no ischemia, EF 61%.      Current Outpatient Prescriptions  Medication Sig Dispense Refill  . aspirin 81 MG tablet Take 81 mg by mouth daily.        . clonazePAM (KLONOPIN) 1 MG tablet take 1 tablet by mouth twice a day if needed for anxiety  60 tablet  2  . dextromethorphan-guaiFENesin (MUCINEX DM) 30-600 MG per 12 hr tablet Take 1 tablet by mouth every 12 (twelve) hours.        Marland Kitchen  Dextromethorphan-Guaifenesin (TUSSIN DM) 10-100 MG/5ML liquid Take 5 mLs by mouth every 12 (twelve) hours.        . folic acid (FOLVITE) 800 MCG tablet Take by mouth 2 (two) times daily.       . hydroxychloroquine (PLAQUENIL) 200 MG tablet Take 200 mg by mouth 2 (two) times daily.       Marland Kitchen leflunomide (ARAVA) 20 MG tablet Take 20 mg by mouth daily.        Marland Kitchen loperamide (IMODIUM A-D) 2 MG tablet Take 2 mg by mouth as needed.        Marland Kitchen losartan (COZAAR) 25 MG tablet 3 tabs per day  270 tablet  3  .  methylPREDNISolone (MEDROL) 4 MG tablet Take 4 mg by mouth daily.       . metoprolol tartrate (LOPRESSOR) 25 MG tablet Take 1 tablet (25 mg total) by mouth 2 (two) times daily.  60 tablet  11  . Misc Natural Products (OSTEO BI-FLEX ADV TRIPLE ST PO) Take by mouth 2 (two) times daily.        . Omega-3 Fatty Acids (FISH OIL) 1200 MG CAPS Take by mouth daily.        Marland Kitchen omeprazole (PRILOSEC) 20 MG capsule TAKE 1 CAPSULE BY MOUTH ONCE DAILY  30 capsule  10  . zolpidem (AMBIEN CR) 12.5 MG CR tablet take 1 tablet by mouth at bedtime if needed  30 tablet  5    Allergies: Allergies  Allergen Reactions  . Atorvastatin     REACTION: myalgias  . Hydroxychloroquine Sulfate     REACTION: GI upset and HA    History  Substance Use Topics  . Smoking status: Never Smoker   . Smokeless tobacco: Not on file  . Alcohol Use: No     ROS:  Please see the history of present illness.   He does note a recent history of a very brief episode of chest pain he felt as though he was drooling with this.  However, he was not drooling.  He denies any facial droop, speech difficulty, unilateral weakness or amaurosis fugax.  He has had some headaches and fatigue.  All other systems reviewed and negative.   PHYSICAL EXAM: VS:  BP 130/88  Pulse 78  Ht 6' (1.829 m)  Wt 217 lb 12.8 oz (98.793 kg)  BMI 29.54 kg/m2 Repeat blood pressure by me: Left 120/88; right 118/90  Well nourished, well developed, in no acute distress HEENT: normal Neck: no JVD Vascular: No carotid bruits Endocrine: no thyromegaly Chest:  + tenderness to palpation  Cardiac:  normal S1, S2; RRR; no murmur Lungs:  clear to auscultation bilaterally, no wheezing, rhonchi or rales Abd: soft, nontender, no hepatomegaly Ext: no edema Skin: warm and dry Neuro:  CNs 2-12 intact, no focal abnormalities noted  EKG:  Sinus rhythm, heart rate 78, normal axis, no ischemic changes  ASSESSMENT AND PLAN:

## 2011-10-03 NOTE — Assessment & Plan Note (Signed)
Check a CBC and TSH 

## 2011-10-03 NOTE — Assessment & Plan Note (Addendum)
I believe that his blood pressure elevations are likely related to high salt in his diet as well as NSAID use.  I recommended that he limit his salt as much as possible as well as to avoid NSAIDs.  He will continue to watch his blood pressures.  If they remain elevated, we could consider changing his Cozaar to Hyzaar to see if this would help, especially his diastolic number.  Check a basic metabolic panel today.

## 2011-10-03 NOTE — Patient Instructions (Addendum)
Your physician recommends that you schedule a follow-up appointment in: as scheduled with Dr Clifton [name] Your physician recommends that you have lab work drawn today (BMP, CBC,TSH, D Dimer) Your physician has requested that you have an exercise tolerance test. For further information please visit [email or web address] Please also follow instruction sheet, as given. Your physician has recommended you make the following change in your medication: DO NOT TAKE any NSAID'S such as Ibuprofen or advil      3 to 4 Gram Sodium Diet, No Added Salt (NAS) A 3 to 4 gram sodium diet restricts the amount of sodium in the diet to no more than 3 to 4 g or 3000 to 4000 mg daily. Limiting the amount of sodium is often used to help lower blood pressure. It is important if you have heart, liver, or kidney problems. Many foods contain sodium for flavor and sometimes as a preservative. When the amount of sodium in a diet needs to be low, it is important to know what to look for when choosing foods and drinks. The following includes some information and guidelines to help make it easier for you to adapt to a low sodium diet. QUICK TIPS  Do not add salt to food.   Avoid convenience items and fast food.   Choose unsalted snack foods.   Buy lower sodium products, often labeled as "lower sodium" or "no salt added."   Check food labels to learn how much sodium is in 1 serving.   When eating at a restaurant, ask that your food be prepared with less salt or none, if possible.  READING FOOD LABELS FOR SODIUM INFORMATION The nutrition facts label is a good place to find how much sodium is in foods. Look for products with no more than 500 to 600 mg of sodium per meal and no more than 150 mg per serving. Remember that 3 to 4 g = 3000 to 4000 mg. The food label may also list foods as:  Sodium-free: Less than 5 mg in a serving.   Very low sodium: 35 mg or less in a serving.   Low-sodium: 140 mg or less in a serving.    Light in sodium: 50% less sodium in a serving. For example, if a food that usually has 300 mg of sodium is changed to become light in sodium, it will have 150 mg of sodium.   Reduced sodium: 25% less sodium in a serving. For example, if a food that usually has 400 mg of sodium is changed to reduced sodium, it will have 300 mg of sodium.  CHOOSING FOODS Grains  Avoid: Salted crackers and snack items. Bread stuffing and biscuit mixes. Seasoned rice or pasta mixes.   Choose: Unsalted snack items. English muffins, breads, and rolls. Homemade pancakes and waffles. Most cereals. Pasta.  Meats  Avoid: Salted, canned, smoked, spiced, pickled meats, including fish and poultry. Bacon, ham, sausage, cold cuts, hot dogs, anchovies.   Choose: Low-sodium canned tuna and salmon. Fresh or frozen meat, poultry, and fish.  Dairy  Avoid: Processed cheese and spreads. Cottage cheese. Buttermilk and condensed milk. Regular cheese.   Choose: Milk. Low-sodium cottage cheese. Yogurt. Sour cream. Low-sodium cheese.  Fruits and Vegetables  Avoid: Regular canned vegetables. Regular canned tomato sauce and paste. Frozen vegetables in sauces. Olives. Rosita Fire. Relishes. Sauerkraut.   Choose: Low-sodium canned vegetables. Low-sodium tomato sauce and paste. Frozen or fresh vegetables. Fresh and frozen fruit.  Condiments  Avoid: Canned and packaged gravies. Worcestershire sauce. Tartar sauce. Barbecue  sauce. Soy sauce. Steak sauce. Ketchup. Onion, garlic, and table salt. Meat flavorings and tenderizers.   Choose: Fresh and dried herbs and spices. Low-sodium varieties of mustard and ketchup. Lemon juice. Tabasco sauce. Horseradish.  SAMPLE 3 TO 4 GRAM SODIUM MEAL PLAN  Breakfast / Sodium (mg)  1 cup low-fat milk / 143 mg   2 slices whole-wheat toast / 270 mg   1 tbs heart-healthy margarine / 153 mg   1 hard-boiled egg / 139 mg  Lunch / Sodium (mg)  1 cup raw carrots / 76 mg    cup hummus / 298 mg   1  cup low-fat milk / 143 mg    cup red grapes / 2 mg   1 cup low-sodium chicken and rice soup / 480 mg   10 low-sodium saltine crackers / 191 mg  Dinner / Sodium (mg)  1 cup whole-wheat pasta / 2 mg   1 cup tomato sauce / 1178 mg   3 oz lean ground beef / 57 mg   1 small side salad (1 cup raw spinach leaves,  cup cucumber,  cup yellow bell pepper) / 25 mg   1 tsp ranch dressing / 144 mg  Snack / Sodium (mg)  1 slice cheddar cheese / 258 mg   1 medium apple / 1 mg  Nutrient Analysis  Calories: 2005   Protein: 85 g   Carbohydrate: 245 g   Fat: 78 g   Sodium: 3560 mg  Document Released: 07/22/2005 Document Revised: 04/03/2011 Document Reviewed: 10/23/2009 [facility] Patient Information 2012 [place], [place].

## 2011-10-03 NOTE — Assessment & Plan Note (Addendum)
Atypical.  He feels it is somewhat worse since his blood pressure went up.  Stress testing in 2010 was negative.  I will arrange a plain exercise Treadmill test for him.  He also feels that some of the symptoms are somewhat similar to what he had in 2009 with his pulmonary embolism.  He is not short of breath.  He is not tachycardic.  At that time he mainly complained of elevated blood pressures and fatigue.  I will get a d-dimer with his blood work today.

## 2011-10-03 NOTE — Assessment & Plan Note (Addendum)
He describes symptoms of questionable drooling recently.  However, he has not noticed any drooling.  His symptoms do not sound consistent with a TIA.  He did not have any carotid bruits on exam.  Question if this is related to anxiety about his blood pressure.  No further workup.

## 2011-10-03 NOTE — Assessment & Plan Note (Signed)
Arrange ETT.  Continue aspirin

## 2011-10-04 ENCOUNTER — Telehealth: Payer: Self-pay | Admitting: *Deleted

## 2011-10-04 NOTE — Telephone Encounter (Signed)
Message copied by Tarri Fuller on Fri Oct 04, 2011  9:30 AM ------      Message from: [place], [place] T      Created: Fri Oct 04, 2011  9:19 AM       Normal      Tereso Newcomer, [place]  9:19 AM 10/04/2011

## 2011-10-04 NOTE — Telephone Encounter (Signed)
pt notified of all lab results/ as well as negative D-dimer. Danielle Rankin

## 2011-10-14 ENCOUNTER — Other Ambulatory Visit: Payer: Self-pay | Admitting: Internal Medicine

## 2011-10-14 NOTE — Telephone Encounter (Signed)
Done hardcopy to robin  

## 2011-10-15 NOTE — Telephone Encounter (Signed)
Faxed hardcopy to pharmacy. 

## 2011-10-17 ENCOUNTER — Encounter: Payer: BC Managed Care – PPO | Admitting: Physician Assistant

## 2011-10-25 ENCOUNTER — Other Ambulatory Visit: Payer: Self-pay | Admitting: Internal Medicine

## 2011-10-28 NOTE — Telephone Encounter (Signed)
Faxed hardcopy to pharmacy. 

## 2011-10-28 NOTE — Telephone Encounter (Signed)
Done hardcopy to robin  

## 2012-03-09 ENCOUNTER — Other Ambulatory Visit: Payer: Self-pay | Admitting: Internal Medicine

## 2012-03-31 ENCOUNTER — Telehealth: Payer: Self-pay

## 2012-03-31 ENCOUNTER — Encounter: Payer: Self-pay | Admitting: Internal Medicine

## 2012-03-31 ENCOUNTER — Other Ambulatory Visit: Payer: Self-pay | Admitting: Internal Medicine

## 2012-03-31 ENCOUNTER — Other Ambulatory Visit (INDEPENDENT_AMBULATORY_CARE_PROVIDER_SITE_OTHER): Payer: BC Managed Care – PPO

## 2012-03-31 ENCOUNTER — Ambulatory Visit (INDEPENDENT_AMBULATORY_CARE_PROVIDER_SITE_OTHER): Payer: BC Managed Care – PPO | Admitting: Internal Medicine

## 2012-03-31 VITALS — BP 112/78 | HR 83 | Temp 97.7°F | Ht 72.0 in | Wt 208.5 lb

## 2012-03-31 DIAGNOSIS — Z0000 Encounter for general adult medical examination without abnormal findings: Secondary | ICD-10-CM

## 2012-03-31 LAB — BASIC METABOLIC PANEL
BUN: 20 mg/dL (ref 6–23)
CO2: 25 mEq/L (ref 19–32)
Chloride: 108 mEq/L (ref 96–112)
Glucose, Bld: 89 mg/dL (ref 70–99)
Potassium: 4.2 mEq/L (ref 3.5–5.1)

## 2012-03-31 LAB — HEPATIC FUNCTION PANEL
ALT: 41 U/L (ref 0–53)
Albumin: 3.5 g/dL (ref 3.5–5.2)
Total Bilirubin: 0.6 mg/dL (ref 0.3–1.2)

## 2012-03-31 LAB — URINALYSIS, ROUTINE W REFLEX MICROSCOPIC
Hgb urine dipstick: NEGATIVE
Nitrite: NEGATIVE
Total Protein, Urine: NEGATIVE
pH: 6 (ref 5.0–8.0)

## 2012-03-31 LAB — CBC WITH DIFFERENTIAL/PLATELET
Basophils Relative: 0.4 % (ref 0.0–3.0)
Eosinophils Relative: 1.7 % (ref 0.0–5.0)
Lymphocytes Relative: 21.2 % (ref 12.0–46.0)
MCV: 90.2 fl (ref 78.0–100.0)
Monocytes Absolute: 0.9 10*3/uL (ref 0.1–1.0)
Monocytes Relative: 8.6 % (ref 3.0–12.0)
Neutrophils Relative %: 68.1 % (ref 43.0–77.0)
RBC: 4.61 Mil/uL (ref 4.22–5.81)
WBC: 10.9 10*3/uL — ABNORMAL HIGH (ref 4.5–10.5)

## 2012-03-31 LAB — LIPID PANEL
HDL: 37.1 mg/dL — ABNORMAL LOW (ref >39.00)
LDL Cholesterol: 110 mg/dL — ABNORMAL HIGH (ref 0–99)
Total CHOL/HDL Ratio: 5
VLDL: 28.4 mg/dL (ref 0.0–40.0)

## 2012-03-31 MED ORDER — OMEPRAZOLE 20 MG PO CPDR: 20.0000 mg | DELAYED_RELEASE_CAPSULE | Freq: Every day | ORAL | Status: DC

## 2012-03-31 MED ORDER — ROSUVASTATIN CALCIUM 10 MG PO TABS: 10.0000 mg | ORAL_TABLET | Freq: Every day | ORAL | Status: DC

## 2012-03-31 MED ORDER — LOSARTAN POTASSIUM 25 MG PO TABS: ORAL_TABLET | ORAL | Status: DC

## 2012-03-31 MED ORDER — ZOLPIDEM TARTRATE ER 12.5 MG PO TBCR: 12.5000 mg | EXTENDED_RELEASE_TABLET | Freq: Every evening | ORAL | Status: DC | PRN

## 2012-03-31 MED ORDER — CLONAZEPAM 1 MG PO TABS: 1.0000 mg | ORAL_TABLET | Freq: Two times a day (BID) | ORAL | Status: DC | PRN

## 2012-03-31 MED ORDER — METOPROLOL TARTRATE 25 MG PO TABS: 25.0000 mg | ORAL_TABLET | Freq: Two times a day (BID) | ORAL | Status: DC

## 2012-03-31 NOTE — Telephone Encounter (Signed)
Ok to do this, has uncontrolled LDL, hx of CAD and need for LDL < 70, unable to tolerate Lipitor due to myalgias

## 2012-03-31 NOTE — Progress Notes (Signed)
Subjective:    Patient ID: [name] Michael, male    DOB: 1950-08-16, 61 y.o.   MRN: [id number]  HPI  Here for wellness and f/u;  Overall doing ok;  Pt denies CP, worsening SOB, DOE, wheezing, orthopnea, PND, worsening LE edema, palpitations, dizziness or syncope.  Pt denies neurological change such as new Headache, facial or extremity weakness.  Pt denies polydipsia, polyuria, or low sugar symptoms. Pt states overall good compliance with treatment and medications, good tolerability, and trying to follow lower cholesterol diet.  Pt denies worsening depressive symptoms, suicidal ideation or panic. No fever, wt loss, night sweats, loss of appetite, or other constitutional symptoms.  Pt states good ability with ADL's, low fall risk, home safety reviewed and adequate, no significant changes in hearing or vision, and occasionally active with exercise.  C/o low stamina sometimes during the day where a pice of candy or cookie seems to help.  Also with ongoing insomnia.  But also walks 2 miles daily often in about 45 min, sometimes 3 miles.  Father with end stage DM, legally blind, renal failure, CHF and not expected to live for another 1-2 months.  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 Past Medical History  Diagnosis Date  . ALLERGIC RHINITIS 04/30/2007  . ANEMIA-NOS 07/12/2007  . ANXIETY 02/17/2009  . 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 DEPRESSIVE DISORDER 02/14/2009  . HYPERLIPIDEMIA 04/27/2007  . HYPERTENSION 04/27/2007  . INSOMNIA UNSPECIFIED 02/20/2010  . 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 Nonspecific abnormal toxicological findings 03/14/2008  . OBESITY 04/30/2007  . OSTEOARTHRITIS 04/30/2007  . 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 History of pulmonary embolism 03/09/2008  . PROSTATITIS, HX OF 04/30/2007  . RESTRICTIVE LUNG DISEASE 06/20/2009  . THORACIC AORTIC ANEURYSM 07/06/2009    Chest CT 11/12: ascending thoracic aortic aneurysm stable at 42 mm.   Marland Kitchen UNSPECIFIED MYALGIA AND MYOSITIS 02/14/2009    . CAD (coronary artery disease)     LHC 11/09 proximal RCA 30%, mid RCA 30%, distal RCA with serial 30-40%, EF 40-45%, normal right heart pressures.  Echo 11/10 mild LVH, EF 55-65%, grade 1 diastolic dysfunction, aortic root moderately dilated, mild LAE.;  Myoview 11/10: Small lateral scar, no ischemia, EF 61%.     Past Surgical History  Procedure Date  . Appendectomy     reports that he has never smoked. He does not have any smokeless tobacco history on file. He reports that he does not drink alcohol or use illicit drugs. family history includes Alcohol abuse in his other; Diabetes in his other; Heart disease in his other; Hypertension in his other; and Stroke in his other. Allergies  Allergen Reactions  . Atorvastatin     REACTION: myalgias  . Hydroxychloroquine Sulfate     REACTION: GI upset and HA   Current Outpatient Prescriptions on File Prior to Visit  Medication Sig Dispense Refill  . aspirin 81 MG tablet Take 81 mg by mouth daily.        . clonazePAM (KLONOPIN) 1 MG tablet take 1 tablet by mouth twice a day if needed for anxiety  60 tablet  2  . dextromethorphan-guaiFENesin (MUCINEX DM) 30-600 MG per 12 hr tablet Take 1 tablet by mouth every 12 (twelve) hours.        . Dextromethorphan-Guaifenesin (TUSSIN DM) 10-100 MG/5ML liquid Take 5 mLs by mouth every 12 (twelve) hours.        . folic acid (FOLVITE) 800 MCG tablet Take by mouth 2 (two) times daily.       Marland Kitchen  hydroxychloroquine (PLAQUENIL) 200 MG tablet Take 200 mg by mouth 2 (two) times daily.       Marland Kitchen leflunomide (ARAVA) 20 MG tablet Take 20 mg by mouth daily.        Marland Kitchen loperamide (IMODIUM A-D) 2 MG tablet Take 2 mg by mouth as needed.        Marland Kitchen losartan (COZAAR) 25 MG tablet 3 tabs per day  270 tablet  3  . methylPREDNISolone (MEDROL) 4 MG tablet Take 4 mg by mouth daily.       . metoprolol tartrate (LOPRESSOR) 25 MG tablet Take 1 tablet (25 mg total) by mouth 2 (two) times daily.  60 tablet  11  . Omega-3 Fatty Acids (FISH  OIL) 1200 MG CAPS Take by mouth daily.        Marland Kitchen omeprazole (PRILOSEC) 20 MG capsule TAKE 1 CAPSULE BY MOUTH ONCE DAILY  30 capsule  5  . zolpidem (AMBIEN CR) 12.5 MG CR tablet take 1 tablet by mouth at bedtime if needed  30 tablet  5   Review of Systems Review of Systems  Constitutional: Negative for diaphoresis, activity change, appetite change and unexpected weight change.  HENT: Negative for hearing loss, ear pain, facial swelling, mouth sores and neck stiffness.   Eyes: Negative for pain, redness and visual disturbance.  Respiratory: Negative for shortness of breath and wheezing.   Cardiovascular: Negative for chest pain and palpitations.  Gastrointestinal: Negative for diarrhea, blood in stool, abdominal distention and rectal pain.  Genitourinary: Negative for hematuria, flank pain and decreased urine volume.  Musculoskeletal: Negative for myalgias and joint swelling.  Skin: Negative for color change and wound.  Neurological: Negative for syncope and numbness.  Hematological: Negative for adenopathy.  Psychiatric/Behavioral: Negative for hallucinations, self-injury, decreased concentration and agitation.      Objective:   Physical Exam BP 112/78  Pulse 83  Temp 97.7 F (36.5 C) (Oral)  Ht 6' (1.829 m)  Wt 208 lb 8 oz (94.575 kg)  BMI 28.28 kg/m2  SpO2 93% Physical Exam  VS noted Constitutional: Pt is oriented to person, place, and time. Appears well-developed and well-nourished.  HENT:  Head: Normocephalic and atraumatic.  Right Ear: External ear normal.  Left Ear: External ear normal.  Nose: Nose normal.  Mouth/Throat: Oropharynx is clear and moist.  Eyes: Conjunctivae and EOM are normal. Pupils are equal, round, and reactive to light.  Neck: Normal range of motion. Neck supple. No JVD present. No tracheal deviation present.  Cardiovascular: Normal rate, regular rhythm, normal heart sounds and intact distal pulses.   Pulmonary/Chest: Effort normal and breath sounds  normal.  Abdominal: Soft. Bowel sounds are normal. There is no tenderness.  Musculoskeletal: Normal range of motion. Exhibits no edema.  Lymphadenopathy:  Has no cervical adenopathy.  Neurological: Pt is alert and oriented to person, place, and time. Pt has normal reflexes. No cranial nerve deficit.  Skin: Skin is warm and dry. No rash noted.  Psychiatric:  Has  normal mood and affect. Behavior is normal.     Assessment & Plan:

## 2012-03-31 NOTE — Telephone Encounter (Signed)
Faxed Biometric Screening form to [facility] at [phone] per MD instructions

## 2012-03-31 NOTE — Telephone Encounter (Signed)
Received PA on Crestor 10 mg.  Please advise

## 2012-03-31 NOTE — Patient Instructions (Addendum)
Continue all other medications as before All of your medications were refilled today, except for the Rheumatology medications Please have the pharmacy call with any otherefills you may need. Your blood test results are pending You will be contacted by phone if any changes need to be made immediately.  Otherwise, you will receive a letter about your results with an explanation. Your form will be filled out and faxed most likely later today Please keep your appointments with your specialists as you have planned - Rheumatology, and Cardiology Please continue your efforts at being more active, low cholesterol diet, and weight control, as you are doing Please return in 1 year for your yearly visit, or sooner if needed, with Lab testing done 3-5 days before

## 2012-03-31 NOTE — Assessment & Plan Note (Signed)
Overall doing well, age appropriate education and counseling updated, referrals for preventative services and immunizations addressed, dietary and smoking counseling addressed, most recent labs and ECG reviewed.  I have personally reviewed and have noted: 1) the patient's medical and social history 2) The pt's use of alcohol, tobacco, and illicit drugs 3) The patient's current medications and supplements 4) Functional ability including ADL's, fall risk, home safety risk, hearing and visual impairment 5) Diet and physical activities 6) Evidence for depression or mood disorder 7) The patient's height, weight, and BMI have been recorded in the chart I have made referrals, and provided counseling and education based on review of the above meds refilled today, labs are pending, form to be filled out for work as well

## 2012-04-01 NOTE — Telephone Encounter (Signed)
Received PA approval from [facility] Ref. # [id number].  Crestor has been approved from 04/01/2012 through 12/26/2014.

## 2012-10-12 ENCOUNTER — Other Ambulatory Visit: Payer: Self-pay | Admitting: Internal Medicine

## 2012-10-13 NOTE — Telephone Encounter (Signed)
Faxed hardcopy to pharmacy. 

## 2012-10-13 NOTE — Telephone Encounter (Signed)
Done hardcopy to robin  

## 2013-01-01 ENCOUNTER — Encounter: Payer: Self-pay | Admitting: Internal Medicine

## 2013-01-01 ENCOUNTER — Ambulatory Visit (INDEPENDENT_AMBULATORY_CARE_PROVIDER_SITE_OTHER): Payer: BC Managed Care – PPO | Admitting: Internal Medicine

## 2013-01-01 ENCOUNTER — Other Ambulatory Visit (INDEPENDENT_AMBULATORY_CARE_PROVIDER_SITE_OTHER): Payer: BC Managed Care – PPO

## 2013-01-01 VITALS — BP 102/80 | HR 78 | Temp 98.5°F | Ht 72.0 in | Wt 209.2 lb

## 2013-01-01 DIAGNOSIS — R350 Frequency of micturition: Secondary | ICD-10-CM

## 2013-01-01 DIAGNOSIS — W57XXXA Bitten or stung by nonvenomous insect and other nonvenomous arthropods, initial encounter: Secondary | ICD-10-CM

## 2013-01-01 DIAGNOSIS — N453 Epididymo-orchitis: Secondary | ICD-10-CM

## 2013-01-01 DIAGNOSIS — M25569 Pain in unspecified knee: Secondary | ICD-10-CM

## 2013-01-01 DIAGNOSIS — T148 Other injury of unspecified body region: Secondary | ICD-10-CM

## 2013-01-01 DIAGNOSIS — I10 Essential (primary) hypertension: Secondary | ICD-10-CM

## 2013-01-01 DIAGNOSIS — N452 Orchitis: Secondary | ICD-10-CM

## 2013-01-01 DIAGNOSIS — M25561 Pain in right knee: Secondary | ICD-10-CM

## 2013-01-01 DIAGNOSIS — Z0000 Encounter for general adult medical examination without abnormal findings: Secondary | ICD-10-CM

## 2013-01-01 LAB — URINALYSIS, ROUTINE W REFLEX MICROSCOPIC
Ketones, ur: NEGATIVE
Leukocytes, UA: NEGATIVE
RBC / HPF: NONE SEEN (ref 0–?)
Specific Gravity, Urine: 1.015 (ref 1.000–1.030)
Urine Glucose: NEGATIVE
WBC, UA: NONE SEEN (ref 0–?)
pH: 6 (ref 5.0–8.0)

## 2013-01-01 MED ORDER — IBUPROFEN 600 MG PO TABS: 600.0000 mg | ORAL_TABLET | Freq: Three times a day (TID) | ORAL | Status: DC | PRN

## 2013-01-01 MED ORDER — DOXYCYCLINE HYCLATE 100 MG PO TABS: 100.0000 mg | ORAL_TABLET | Freq: Two times a day (BID) | ORAL | Status: DC

## 2013-01-01 NOTE — Progress Notes (Signed)
Subjective:    Patient ID: [name] Michael, male    DOB: 22-Jul-1951, 62 y.o.   MRN: [id number]  HPI  Here with acute onset 1 wk intermittent right scrotal pain/tender/mild swelling dull but since last night constant/persistent now moderate, assoc with mild urinary hesitancy and some perineal discomfort for 2 wks.  Nothing seems to make better or worse.  Denies urinary symptoms such as dysuria, frequency, urgency, flank pain, hematuria or n/v, fever, chills.   Incidentally 2 ticks recently 2 wks ago , one on the scrotum but small ticks, not sure of any actual bite, no local or other rash, no high fever or chills.  Incidentally not taking crestor as caused myalgias.  Also mentions having significant but mild sharp shooting and burning pain to the right anterior knee only for several months, worse to mow the grass with walking mower, has some swelling, no fever, trauma, no giveaways, falls, catch or click.   Past Medical History  Diagnosis Date  . ALLERGIC RHINITIS 04/30/2007  . ANEMIA-NOS 07/12/2007  . ANXIETY 02/17/2009  . 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 DEPRESSIVE DISORDER 02/14/2009  . HYPERLIPIDEMIA 04/27/2007  . HYPERTENSION 04/27/2007  . INSOMNIA UNSPECIFIED 02/20/2010  . 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 Nonspecific abnormal toxicological findings 03/14/2008  . OBESITY 04/30/2007  . OSTEOARTHRITIS 04/30/2007  . 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 History of pulmonary embolism 03/09/2008  . PROSTATITIS, HX OF 04/30/2007  . RESTRICTIVE LUNG DISEASE 06/20/2009  . THORACIC AORTIC ANEURYSM 07/06/2009    Chest CT 11/12: ascending thoracic aortic aneurysm stable at 42 mm.   Marland Kitchen UNSPECIFIED MYALGIA AND MYOSITIS 02/14/2009  . CAD (coronary artery disease)     LHC 11/09 proximal RCA 30%, mid RCA 30%, distal RCA with serial 30-40%, EF 40-45%, normal right heart pressures.  Echo 11/10 mild LVH, EF 55-65%, grade 1 diastolic dysfunction, aortic root moderately dilated, mild LAE.;  Myoview 11/10: Small lateral  scar, no ischemia, EF 61%.     Past Surgical History  Procedure Laterality Date  . Appendectomy      reports that he has never smoked. He does not have any smokeless tobacco history on file. He reports that he does not drink alcohol or use illicit drugs. family history includes Alcohol abuse in his other; Diabetes in his other; Heart disease in his other; Hypertension in his other; and Stroke in his other. Allergies  Allergen Reactions  . Atorvastatin     REACTION: myalgias  . Hydroxychloroquine Sulfate     REACTION: GI upset and HA   Current Outpatient Prescriptions on File Prior to Visit  Medication Sig Dispense Refill  . aspirin 81 MG tablet Take 81 mg by mouth daily.        . clonazePAM (KLONOPIN) 1 MG tablet take 1 tablet by mouth twice a day if needed for anxiety  60 tablet  3  . dextromethorphan-guaiFENesin (MUCINEX DM) 30-600 MG per 12 hr tablet Take 1 tablet by mouth every 12 (twelve) hours.        . Dextromethorphan-Guaifenesin (TUSSIN DM) 10-100 MG/5ML liquid Take 5 mLs by mouth every 12 (twelve) hours.        . folic acid (FOLVITE) 800 MCG tablet Take by mouth 2 (two) times daily.       . hydroxychloroquine (PLAQUENIL) 200 MG tablet Take 200 mg by mouth 2 (two) times daily.       Marland Kitchen leflunomide (ARAVA) 20 MG tablet Take 20 mg by mouth daily.        Marland Kitchen  loperamide (IMODIUM A-D) 2 MG tablet Take 2 mg by mouth as needed.        Marland Kitchen losartan (COZAAR) 25 MG tablet 3 tabs per day  270 tablet  3  . methylPREDNISolone (MEDROL) 4 MG tablet Take 4 mg by mouth daily.       . metoprolol tartrate (LOPRESSOR) 25 MG tablet Take 1 tablet (25 mg total) by mouth 2 (two) times daily.  180 tablet  3  . Misc Natural Products LIQD Take by mouth.      . Omega-3 Fatty Acids (FISH OIL) 1200 MG CAPS Take by mouth daily.        Marland Kitchen omeprazole (PRILOSEC) 20 MG capsule Take 1 capsule (20 mg total) by mouth daily.  90 capsule  3  . zolpidem (AMBIEN CR) 12.5 MG CR tablet take 1 tablet by mouth at bedtime if  needed for sleep  30 tablet  5   No current facility-administered medications on file prior to visit.   Review of Systems  Constitutional: Negative for unexpected weight change, or unusual diaphoresis  HENT: Negative for tinnitus.   Eyes: Negative for photophobia and visual disturbance.  Respiratory: Negative for choking and stridor.   Gastrointestinal: Negative for vomiting and blood in stool.  Genitourinary: Negative for hematuria and decreased urine volume.  Musculoskeletal: Negative for acute joint swelling Skin: Negative for color change and wound.  Neurological: Negative for tremors and numbness other than noted  Psychiatric/Behavioral: Negative for decreased concentration or  hyperactivity.       Objective:   Physical Exam BP 102/80  Pulse 78  Temp(Src) 98.5 F (36.9 C) (Oral)  Ht 6' (1.829 m)  Wt 209 lb 4 oz (94.915 kg)  BMI 28.37 kg/m2  SpO2 93% VS noted,  Constitutional: Pt appears well-developed and well-nourished.  HENT: Head: NCAT.  Right Ear: External ear normal.  Left Ear: External ear normal.  Eyes: Conjunctivae and EOM are normal. Pupils are equal, round, and reactive to light.  Neck: Normal range of motion. Neck supple.  Cardiovascular: Normal rate and regular rhythm.   Pulmonary/Chest: Effort normal and breath sounds normal.  Abd:  Soft, NT, non-distended, + BS GU: normal penis, no d/c, ulcers.  Scrotum normal appearance- no red/tender.  Left testicle NT, no swelling, right testicle with mild tender/swelling; no hernias Neurological: Pt is alert. Not confused Right knee with FROM, NT but mild crepitus and warmth medial, mild medial bony osteophytic change and trace effusion, lachman neg,   Skin: Skin is warm. No erythema. No bite site Psychiatric: Pt behavior is normal. Thought content normal.     Assessment & Plan:

## 2013-01-01 NOTE — Patient Instructions (Addendum)
Please take all new medication as prescribed - the antibiotic, and the ibuprofen OK to stay off the crestor as you have been doing Please continue all other medications as before Please have the pharmacy call with any other refills you may need. Please call if you need referral for right knee with orthopedic, or you can follow with rheumatology for possible cortisone to the right knee (and likely an xray as well) Please return in 3 months, or sooner if needed, with Lab testing done 3-5 days before

## 2013-01-02 DIAGNOSIS — W57XXXA Bitten or stung by nonvenomous insect and other nonvenomous arthropods, initial encounter: Secondary | ICD-10-CM | POA: Insufficient documentation

## 2013-01-02 DIAGNOSIS — M25561 Pain in right knee: Secondary | ICD-10-CM | POA: Insufficient documentation

## 2013-01-02 DIAGNOSIS — N452 Orchitis: Secondary | ICD-10-CM | POA: Insufficient documentation

## 2013-01-02 NOTE — Assessment & Plan Note (Addendum)
Mild to mod, for antibx course and with prob prostate involvement by hx for 3 wk course,  to f/u any worsening symptoms or concerns, for urine studies

## 2013-01-02 NOTE — Assessment & Plan Note (Signed)
stable overall by history and exam, recent data reviewed with pt, and pt to continue medical treatment as before,  to f/u any worsening symptoms or concerns BP Readings from Last 3 Encounters:  01/01/13 102/80  03/31/12 112/78  10/03/11 130/88

## 2013-01-02 NOTE — Assessment & Plan Note (Signed)
No significant evidence, but did mention to pt that if bite has occurred, the doxy would cover RMSF as well

## 2013-01-02 NOTE — Assessment & Plan Note (Signed)
Exam c/w DJD, for ibuprofen prn, declines films today or ortho,  to f/u any worsening symptoms or concerns

## 2013-01-04 ENCOUNTER — Encounter: Payer: Self-pay | Admitting: Internal Medicine

## 2013-01-20 ENCOUNTER — Telehealth: Payer: Self-pay

## 2013-01-20 MED ORDER — DOXYCYCLINE HYCLATE 100 MG PO TABS: 100.0000 mg | ORAL_TABLET | Freq: Two times a day (BID) | ORAL | Status: DC

## 2013-01-20 NOTE — Telephone Encounter (Signed)
Patient informed. 

## 2013-01-20 NOTE — Telephone Encounter (Signed)
Ok fr 10 more days tx - done erx

## 2013-01-20 NOTE — Telephone Encounter (Signed)
The patient stated he is 80% better, but not completely and did not know if he needed another round of antibx or not.  If so send to [address]

## 2013-01-20 NOTE — Telephone Encounter (Signed)
Patient called [facility] stating that he has been tatking antibiotic about 20 day for prostate infection. He would like to know if another refill is needed or what MD advisement is. Thanks

## 2013-01-20 NOTE — Telephone Encounter (Signed)
Should be ok off antibx after completed antibx, unless he has any further fever, pain, swelling

## 2013-01-26 ENCOUNTER — Other Ambulatory Visit (INDEPENDENT_AMBULATORY_CARE_PROVIDER_SITE_OTHER): Payer: BC Managed Care – PPO

## 2013-01-26 ENCOUNTER — Encounter: Payer: Self-pay | Admitting: Internal Medicine

## 2013-01-26 ENCOUNTER — Ambulatory Visit (INDEPENDENT_AMBULATORY_CARE_PROVIDER_SITE_OTHER): Payer: BC Managed Care – PPO | Admitting: Internal Medicine

## 2013-01-26 VITALS — BP 120/90 | HR 92 | Temp 98.1°F | Ht 71.5 in | Wt 210.0 lb

## 2013-01-26 DIAGNOSIS — N452 Orchitis: Secondary | ICD-10-CM

## 2013-01-26 DIAGNOSIS — N453 Epididymo-orchitis: Secondary | ICD-10-CM

## 2013-01-26 DIAGNOSIS — I10 Essential (primary) hypertension: Secondary | ICD-10-CM

## 2013-01-26 DIAGNOSIS — R21 Rash and other nonspecific skin eruption: Secondary | ICD-10-CM

## 2013-01-26 LAB — URINALYSIS, ROUTINE W REFLEX MICROSCOPIC
Hgb urine dipstick: NEGATIVE
Leukocytes, UA: NEGATIVE
Nitrite: NEGATIVE
Total Protein, Urine: NEGATIVE
pH: 6 (ref 5.0–8.0)

## 2013-01-26 LAB — CBC WITH DIFFERENTIAL/PLATELET
Basophils Absolute: 0 10*3/uL (ref 0.0–0.1)
Eosinophils Relative: 1.1 % (ref 0.0–5.0)
Monocytes Absolute: 0.8 10*3/uL (ref 0.1–1.0)
Monocytes Relative: 7.8 % (ref 3.0–12.0)
Neutrophils Relative %: 76 % (ref 43.0–77.0)
Platelets: 304 10*3/uL (ref 150.0–400.0)
RDW: 13.3 % (ref 11.5–14.6)
WBC: 10.1 10*3/uL (ref 4.5–10.5)

## 2013-01-26 LAB — BASIC METABOLIC PANEL
BUN: 13 mg/dL (ref 6–23)
CO2: 26 mEq/L (ref 19–32)
Chloride: 106 mEq/L (ref 96–112)
Glucose, Bld: 108 mg/dL — ABNORMAL HIGH (ref 70–99)
Potassium: 4.2 mEq/L (ref 3.5–5.1)

## 2013-01-26 LAB — HEPATIC FUNCTION PANEL
ALT: 49 U/L (ref 0–53)
AST: 34 U/L (ref 0–37)
Albumin: 4 g/dL (ref 3.5–5.2)

## 2013-01-26 MED ORDER — METHYLPREDNISOLONE ACETATE 80 MG/ML IJ SUSP
80.0000 mg | Freq: Once | INTRAMUSCULAR | Status: AC
  Administered 2013-01-26: 80 mg via INTRAMUSCULAR

## 2013-01-26 MED ORDER — PREDNISONE 10 MG PO TABS: ORAL_TABLET | ORAL | Status: DC

## 2013-01-26 NOTE — Progress Notes (Signed)
Subjective:    Patient ID: [name] Michael, male    DOB: October 15, 1950, 62 y.o.   MRN: [id number]  HPI  Here to f/u unfort with onset dramatic erythema mild itchy rash to hands , left wrist, elbows, right wrist, started one day after started the refill doxycycline, though seemed to be the same generic manufacturer/same pills.  No fever, pain except somewhat tender to touch the red areas.  Had worsening shakes, several loose stools, HA and nausea so needed to be seen today.  No other angioedema like symptoms, lip or tongue swelling sob,wheezing.  Of note, did work in yard in the sun prior to onset symtpoms.   Past Medical History  Diagnosis Date  . ALLERGIC RHINITIS 04/30/2007  . ANEMIA-NOS 07/12/2007  . ANXIETY 02/17/2009  . 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 DEPRESSIVE DISORDER 02/14/2009  . HYPERLIPIDEMIA 04/27/2007  . HYPERTENSION 04/27/2007  . INSOMNIA UNSPECIFIED 02/20/2010  . 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 Nonspecific abnormal toxicological findings 03/14/2008  . OBESITY 04/30/2007  . OSTEOARTHRITIS 04/30/2007  . 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 History of pulmonary embolism 03/09/2008  . PROSTATITIS, HX OF 04/30/2007  . RESTRICTIVE LUNG DISEASE 06/20/2009  . THORACIC AORTIC ANEURYSM 07/06/2009    Chest CT 11/12: ascending thoracic aortic aneurysm stable at 42 mm.   Marland Kitchen UNSPECIFIED MYALGIA AND MYOSITIS 02/14/2009  . CAD (coronary artery disease)     LHC 11/09 proximal RCA 30%, mid RCA 30%, distal RCA with serial 30-40%, EF 40-45%, normal right heart pressures.  Echo 11/10 mild LVH, EF 55-65%, grade 1 diastolic dysfunction, aortic root moderately dilated, mild LAE.;  Myoview 11/10: Small lateral scar, no ischemia, EF 61%.     Past Surgical History  Procedure Laterality Date  . Appendectomy      reports that he has never smoked. He does not have any smokeless tobacco history on file. He reports that he does not drink alcohol or use illicit drugs. family history includes Alcohol abuse in his  other; Diabetes in his other; Heart disease in his other; Hypertension in his other; and Stroke in his other. Allergies  Allergen Reactions  . Atorvastatin     REACTION: myalgias  . Hydroxychloroquine Sulfate     REACTION: GI upset and HA   Current Outpatient Prescriptions on File Prior to Visit  Medication Sig Dispense Refill  . aspirin 81 MG tablet Take 81 mg by mouth daily.        . clonazePAM (KLONOPIN) 1 MG tablet take 1 tablet by mouth twice a day if needed for anxiety  60 tablet  3  . dextromethorphan-guaiFENesin (MUCINEX DM) 30-600 MG per 12 hr tablet Take 1 tablet by mouth every 12 (twelve) hours.        . Dextromethorphan-Guaifenesin (TUSSIN DM) 10-100 MG/5ML liquid Take 5 mLs by mouth every 12 (twelve) hours.        . folic acid (FOLVITE) 800 MCG tablet Take by mouth 2 (two) times daily.       . hydroxychloroquine (PLAQUENIL) 200 MG tablet Take 200 mg by mouth 2 (two) times daily.       Marland Kitchen ibuprofen (ADVIL,MOTRIN) 600 MG tablet Take 1 tablet (600 mg total) by mouth every 8 (eight) hours as needed for pain.  90 tablet  2  . leflunomide (ARAVA) 20 MG tablet Take 20 mg by mouth daily.        Marland Kitchen loperamide (IMODIUM A-D) 2 MG tablet Take 2 mg by mouth as needed.        Marland Kitchen  losartan (COZAAR) 25 MG tablet 3 tabs per day  270 tablet  3  . methylPREDNISolone (MEDROL) 4 MG tablet Take 4 mg by mouth daily.       . metoprolol tartrate (LOPRESSOR) 25 MG tablet Take 1 tablet (25 mg total) by mouth 2 (two) times daily.  180 tablet  3  . Misc Natural Products LIQD Take by mouth.      . Omega-3 Fatty Acids (FISH OIL) 1200 MG CAPS Take by mouth daily.        Marland Kitchen omeprazole (PRILOSEC) 20 MG capsule Take 1 capsule (20 mg total) by mouth daily.  90 capsule  3  . zolpidem (AMBIEN CR) 12.5 MG CR tablet take 1 tablet by mouth at bedtime if needed for sleep  30 tablet  5   No current facility-administered medications on file prior to visit.   Review of Systems  Constitutional: Negative for unexpected  weight change, or unusual diaphoresis  HENT: Negative for tinnitus.   Eyes: Negative for photophobia and visual disturbance.  Respiratory: Negative for choking and stridor.   Gastrointestinal: Negative for vomiting and blood in stool.  Genitourinary: Negative for hematuria and decreased urine volume.  Musculoskeletal: Negative for acute joint swelling Skin: Negative for color change and wound.  Neurological: Negative for tremors and numbness other than noted  Psychiatric/Behavioral: Negative for decreased concentration or  hyperactivity.       Objective:   Physical Exam BP 120/90  Pulse 92  Temp(Src) 98.1 F (36.7 C) (Oral)  Ht 5' 11.5" (1.816 m)  Wt 210 lb (95.255 kg)  BMI 28.88 kg/m2  SpO2 90% VS noted,  Constitutional: Pt appears well-developed and well-nourished.  HENT: Head: NCAT.  Right Ear: External ear normal.  Left Ear: External ear normal.  Eyes: Conjunctivae and EOM are normal. Pupils are equal, round, and reactive to light.  Neck: Normal range of motion. Neck supple.  Cardiovascular: Normal rate and regular rhythm.   Pulmonary/Chest: Effort normal and breath sounds normal.  Abd:  Soft, NT, non-distended, + BS Neurological: Pt is alert. Not confused  Skin: with plaque like erythema (minimally tender) areas to bilat MCP's, left medial hand, right distal arm/wrist and bilat olecranon elbow areas without bursa sweling, no angioedema Bilat MCP's with 1+ effusions no change with hx of RA Psychiatric: Pt behavior is normal. Thought content normal.     Assessment & Plan:

## 2013-01-26 NOTE — Patient Instructions (Addendum)
You had the steroid shot today Please take all new medication as prescribed - the prednisone You will be contacted regarding the referral for: urology = Dr Vernie Ammons Please go to the LAB in the Basement (turn left off the elevator) for the tests to be done today You will be contacted by phone if any changes need to be made immediately.  Otherwise, you will receive a letter about your results with an explanation, but please check with MyChart first.  Please remember to sign up for My Chart if you have not done so, as this will be important to you in the future with finding out test results, communicating by private email, and scheduling acute appointments online when needed.

## 2013-01-26 NOTE — Assessment & Plan Note (Signed)
Suspect drug rash, ? photosens related with the doxy but not clear; d/c doxy, check basic labs, delcines phenergan, for immodium prn, and predpack asd

## 2013-01-27 NOTE — Assessment & Plan Note (Signed)
stable overall by history and exam, recent data reviewed with pt, and pt to continue medical treatment as before,  to f/u any worsening symptoms or concerns BP Readings from Last 3 Encounters:  01/26/13 120/90  01/01/13 102/80  03/31/12 112/78

## 2013-01-27 NOTE — Assessment & Plan Note (Addendum)
Mild persistent - 80% better per pt, asks for urology referral as still has some perineal discomfort,  to f/u any worsening symptoms or concerns

## 2013-03-27 ENCOUNTER — Other Ambulatory Visit: Payer: Self-pay | Admitting: Internal Medicine

## 2013-03-30 ENCOUNTER — Other Ambulatory Visit (INDEPENDENT_AMBULATORY_CARE_PROVIDER_SITE_OTHER): Payer: BC Managed Care – PPO

## 2013-03-30 DIAGNOSIS — Z0000 Encounter for general adult medical examination without abnormal findings: Secondary | ICD-10-CM

## 2013-03-30 LAB — BASIC METABOLIC PANEL
BUN: 20 mg/dL (ref 6–23)
Calcium: 9.2 mg/dL (ref 8.4–10.5)
GFR: 81.94 mL/min (ref >60.00)
Glucose, Bld: 86 mg/dL (ref 70–99)
Potassium: 4.5 mEq/L (ref 3.5–5.1)

## 2013-03-30 LAB — CBC WITH DIFFERENTIAL/PLATELET
Basophils Absolute: 0 10*3/uL (ref 0.0–0.1)
Eosinophils Absolute: 0.2 10*3/uL (ref 0.0–0.7)
Lymphocytes Relative: 24.9 % (ref 12.0–46.0)
MCHC: 33.6 g/dL (ref 30.0–36.0)
MCV: 90.4 fl (ref 78.0–100.0)
Monocytes Absolute: 0.9 10*3/uL (ref 0.1–1.0)
Neutrophils Relative %: 63.6 % (ref 43.0–77.0)
Platelets: 305 10*3/uL (ref 150.0–400.0)
RDW: 13.1 % (ref 11.5–14.6)

## 2013-03-30 LAB — HEPATIC FUNCTION PANEL
Alkaline Phosphatase: 55 U/L (ref 39–117)
Bilirubin, Direct: 0.1 mg/dL (ref 0.0–0.3)
Total Protein: 6.7 g/dL (ref 6.0–8.3)

## 2013-03-30 LAB — URINALYSIS, ROUTINE W REFLEX MICROSCOPIC
Hgb urine dipstick: NEGATIVE
Nitrite: NEGATIVE
Total Protein, Urine: NEGATIVE
Urine Glucose: NEGATIVE
pH: 6 (ref 5.0–8.0)

## 2013-03-30 LAB — LIPID PANEL
Cholesterol: 194 mg/dL (ref 0–200)
VLDL: 35.8 mg/dL (ref 0.0–40.0)

## 2013-04-01 ENCOUNTER — Encounter: Payer: Self-pay | Admitting: Internal Medicine

## 2013-04-01 ENCOUNTER — Ambulatory Visit (INDEPENDENT_AMBULATORY_CARE_PROVIDER_SITE_OTHER): Payer: BC Managed Care – PPO | Admitting: Internal Medicine

## 2013-04-01 ENCOUNTER — Telehealth: Payer: Self-pay | Admitting: Internal Medicine

## 2013-04-01 VITALS — BP 102/82 | HR 95 | Temp 98.0°F | Ht 72.0 in | Wt 206.2 lb

## 2013-04-01 DIAGNOSIS — Z23 Encounter for immunization: Secondary | ICD-10-CM

## 2013-04-01 DIAGNOSIS — Z2911 Encounter for prophylactic immunotherapy for respiratory syncytial virus (RSV): Secondary | ICD-10-CM

## 2013-04-01 DIAGNOSIS — Z0000 Encounter for general adult medical examination without abnormal findings: Secondary | ICD-10-CM

## 2013-04-01 MED ORDER — COLESEVELAM HCL 625 MG PO TABS: 1875.0000 mg | ORAL_TABLET | Freq: Two times a day (BID) | ORAL | Status: DC

## 2013-04-01 NOTE — Assessment & Plan Note (Signed)

## 2013-04-01 NOTE — Telephone Encounter (Signed)
I thought a year might be a long time at his age with his type and number of problems  He can plan on 1 yr if he wants to do that. thanks

## 2013-04-01 NOTE — Telephone Encounter (Signed)
Message copied by Corwin Levins on Thu Apr 01, 2013  9:20 AM ------      Message from: Scharlene Gloss B      Created: Thu Apr 01, 2013  9:09 AM       The patient would like to know why he needs to return in 6 months as usually only yrly.   ------

## 2013-04-01 NOTE — Patient Instructions (Addendum)
Your EKG was ok today You are given the copy of your lab work Please take all new medication as prescribed - take 2 pills per day of the new Welchol  You had the shingles shot today Please continue all other medications as before, and refills have been done if requested. Please have the pharmacy call with any other refills you may need. Please continue your efforts at being more active, low cholesterol diet, and weight control. You are otherwise up to date with prevention measures today. Please keep your appointments with your specialists as you have planned - rheumatology and urology  Please remember to sign up for My Chart if you have not done so, as this will be important to you in the future with finding out test results, communicating by private email, and scheduling acute appointments online when needed.  Please return in 6 months, or sooner if needed

## 2013-04-01 NOTE — Progress Notes (Signed)
Subjective:    Patient ID: [name] Michael, male    DOB: [birth year], 62 y.o.   MRN: [id number]  HPI  Here for wellness and f/u;  Overall doing ok;  Pt denies CP, worsening SOB, DOE, wheezing, orthopnea, PND, worsening LE edema, palpitations, dizziness or syncope.  Pt denies neurological change such as new headache, facial or extremity weakness.  Pt denies polydipsia, polyuria, or low sugar symptoms. Pt states overall good compliance with treatment and medications, good tolerability, and has been trying to follow lower cholesterol diet.  Pt denies worsening depressive symptoms, suicidal ideation or panic. No fever, night sweats, wt loss, loss of appetite, or other constitutional symptoms.  Pt states good ability with ADL's, has low fall risk, home safety reviewed and adequate, no other significant changes in hearing or vision, and walks 2 miles per day, occasionally active with exercise.  Now about 75% better with his prolonged course of tx for prostatitis per urology.  Was taken off arava for 2 wks per Dr Christy Gentles but pain worsened, better to get back on.  Had significant reaction to cipro as rx last visit her with severe diffuse body pain almost made him go to ER, started 40 min after taking first dose.  Of note did not tolerate crestor with severe myalgias as well.  Past Medical History  Diagnosis Date  . ALLERGIC RHINITIS 04/30/2007  . ANEMIA-NOS 07/12/2007  . ANXIETY 02/17/2009  . 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 DEPRESSIVE DISORDER 02/14/2009  . HYPERLIPIDEMIA 04/27/2007  . HYPERTENSION 04/27/2007  . INSOMNIA UNSPECIFIED 02/20/2010  . 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 Nonspecific abnormal toxicological findings 03/14/2008  . OBESITY 04/30/2007  . OSTEOARTHRITIS 04/30/2007  . 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 History of pulmonary embolism 03/09/2008  . PROSTATITIS, HX OF 04/30/2007  . RESTRICTIVE LUNG DISEASE 06/20/2009  . THORACIC AORTIC ANEURYSM 07/06/2009    Chest CT 11/12: ascending  thoracic aortic aneurysm stable at 42 mm.   Marland Kitchen UNSPECIFIED MYALGIA AND MYOSITIS 02/14/2009  . CAD (coronary artery disease)     LHC 11/09 proximal RCA 30%, mid RCA 30%, distal RCA with serial 30-40%, EF 40-45%, normal right heart pressures.  Echo 11/10 mild LVH, EF 55-65%, grade 1 diastolic dysfunction, aortic root moderately dilated, mild LAE.;  Myoview 11/10: Small lateral scar, no ischemia, EF 61%.     Past Surgical History  Procedure Laterality Date  . Appendectomy      reports that he has never smoked. He does not have any smokeless tobacco history on file. He reports that he does not drink alcohol or use illicit drugs. family history includes Alcohol abuse in his other; Diabetes in his other; Heart disease in his other; Hypertension in his other; Stroke in his other. Allergies  Allergen Reactions  . Atorvastatin     REACTION: myalgias  . Ciprofloxacin Other (See Comments)    myalgias  . Hydroxychloroquine Sulfate     REACTION: GI upset and HA   Current Outpatient Prescriptions on File Prior to Visit  Medication Sig Dispense Refill  . aspirin 81 MG tablet Take 81 mg by mouth daily.        . clonazePAM (KLONOPIN) 1 MG tablet take 1 tablet by mouth twice a day if needed for anxiety  60 tablet  3  . dextromethorphan-guaiFENesin (MUCINEX DM) 30-600 MG per 12 hr tablet Take 1 tablet by mouth every 12 (twelve) hours.        . Dextromethorphan-Guaifenesin (TUSSIN DM) 10-100 MG/5ML liquid Take 5 mLs by mouth  every 12 (twelve) hours.        . folic acid (FOLVITE) 800 MCG tablet Take by mouth 2 (two) times daily.       . hydroxychloroquine (PLAQUENIL) 200 MG tablet Take 200 mg by mouth 2 (two) times daily.       Marland Kitchen ibuprofen (ADVIL,MOTRIN) 600 MG tablet Take 1 tablet (600 mg total) by mouth every 8 (eight) hours as needed for pain.  90 tablet  2  . leflunomide (ARAVA) 20 MG tablet Take 20 mg by mouth daily.        Marland Kitchen loperamide (IMODIUM A-D) 2 MG tablet Take 2 mg by mouth as needed.        Marland Kitchen  losartan (COZAAR) 25 MG tablet 3 tabs per day  270 tablet  3  . methylPREDNISolone (MEDROL) 4 MG tablet Take 4 mg by mouth daily.       . metoprolol tartrate (LOPRESSOR) 25 MG tablet take 1 tablet by mouth twice a day  180 tablet  3  . Misc Natural Products LIQD Take by mouth.      . Omega-3 Fatty Acids (FISH OIL) 1200 MG CAPS Take by mouth daily.        Marland Kitchen omeprazole (PRILOSEC) 20 MG capsule take 1 capsule by mouth once daily  90 capsule  3  . zolpidem (AMBIEN CR) 12.5 MG CR tablet take 1 tablet by mouth at bedtime if needed for sleep  30 tablet  5   No current facility-administered medications on file prior to visit.   Review of Systems Constitutional: Negative for diaphoresis, activity change, appetite change or unexpected weight change.  HENT: Negative for hearing loss, ear pain, facial swelling, mouth sores and neck stiffness.   Eyes: Negative for pain, redness and visual disturbance.  Respiratory: Negative for shortness of breath and wheezing.   Cardiovascular: Negative for chest pain and palpitations.  Gastrointestinal: Negative for diarrhea, blood in stool, abdominal distention or other pain Genitourinary: Negative for hematuria, flank pain or change in urine volume.  Musculoskeletal: Negative for myalgias and joint swelling.  Skin: Negative for color change and wound.  Neurological: Negative for syncope and numbness. other than noted Hematological: Negative for adenopathy.  Psychiatric/Behavioral: Negative for hallucinations, self-injury, decreased concentration and agitation.      Objective:   Physical Exam BP 102/82  Pulse 95  Temp(Src) 98 F (36.7 C) (Oral)  Ht 6' (1.829 m)  Wt 206 lb 4 oz (93.554 kg)  BMI 27.97 kg/m2  SpO2 96% VS noted,  Constitutional: Pt is oriented to person, place, and time. Appears well-developed and well-nourished.  Head: Normocephalic and atraumatic.  Right Ear: External ear normal.  Left Ear: External ear normal.  Nose: Nose normal.   Mouth/Throat: Oropharynx is clear and moist.  Eyes: Conjunctivae and EOM are normal. Pupils are equal, round, and reactive to light.  Neck: Normal range of motion. Neck supple. No JVD present. No tracheal deviation present.  Cardiovascular: Normal rate, regular rhythm, normal heart sounds and intact distal pulses.   Pulmonary/Chest: Effort normal and breath sounds normal.  Abdominal: Soft. Bowel sounds are normal. There is no tenderness. No HSM  Musculoskeletal: Normal range of motion. Exhibits no edema.  Lymphadenopathy:  Has no cervical adenopathy.  Neurological: Pt is alert and oriented to person, place, and time. Pt has normal reflexes. No cranial nerve deficit.  Skin: Skin is warm and dry. No rash noted.  Psychiatric:  Has  normal mood and affect. Behavior is normal.  No active  synovisitis    Assessment & Plan:

## 2013-04-01 NOTE — Telephone Encounter (Signed)
Called the patient as requested per pt. Left detailed msg of MD instructions.

## 2013-04-01 NOTE — Addendum Note (Signed)
Addended by: Scharlene Gloss B on: 04/01/2013 09:12 AM   Modules accepted: Orders

## 2013-04-10 ENCOUNTER — Other Ambulatory Visit: Payer: Self-pay | Admitting: Internal Medicine

## 2013-04-12 ENCOUNTER — Other Ambulatory Visit: Payer: Self-pay | Admitting: Internal Medicine

## 2013-04-12 NOTE — Telephone Encounter (Signed)
Ok to refill? Last OV 8.28.14 Last filled 3.10.14

## 2013-04-12 NOTE — Telephone Encounter (Signed)
Refill done.  

## 2013-04-13 NOTE — Telephone Encounter (Signed)
Faxed hardcopy to [facility] [address] [place] 

## 2013-04-13 NOTE — Telephone Encounter (Signed)
Done hardcopy to robin  

## 2013-06-14 ENCOUNTER — Other Ambulatory Visit: Payer: Self-pay | Admitting: Internal Medicine

## 2013-06-15 NOTE — Telephone Encounter (Signed)
Faxed hardcopy to [facility] [address] [place] 

## 2013-06-15 NOTE — Telephone Encounter (Signed)
Done hardcopy to robin  

## 2013-06-17 ENCOUNTER — Ambulatory Visit (INDEPENDENT_AMBULATORY_CARE_PROVIDER_SITE_OTHER): Payer: BC Managed Care – PPO | Admitting: Internal Medicine

## 2013-06-17 ENCOUNTER — Encounter: Payer: Self-pay | Admitting: Internal Medicine

## 2013-06-17 VITALS — BP 120/82 | HR 104 | Temp 97.4°F | Ht 72.0 in | Wt 209.1 lb

## 2013-06-17 DIAGNOSIS — N400 Enlarged prostate without lower urinary tract symptoms: Secondary | ICD-10-CM

## 2013-06-17 DIAGNOSIS — I10 Essential (primary) hypertension: Secondary | ICD-10-CM

## 2013-06-17 DIAGNOSIS — J019 Acute sinusitis, unspecified: Secondary | ICD-10-CM | POA: Insufficient documentation

## 2013-06-17 MED ORDER — TAMSULOSIN HCL 0.4 MG PO CAPS: 0.4000 mg | ORAL_CAPSULE | Freq: Every day | ORAL | Status: DC

## 2013-06-17 MED ORDER — SULFAMETHOXAZOLE-TRIMETHOPRIM 800-160 MG PO TABS: 1.0000 | ORAL_TABLET | Freq: Two times a day (BID) | ORAL | Status: DC

## 2013-06-17 NOTE — Progress Notes (Signed)
Subjective:    Patient ID: [name] Michael, male    DOB: [birth year], 62 y.o.   MRN: [id number]  HPI   Here with 4-5 days acute onset fever, facial pain, pressure, headache, general weakness and malaise, and greenish d/c, with mild ST and cough, but pt denies chest pain, wheezing, increased sob or doe, orthopnea, PND, increased LE swelling, palpitations, dizziness or syncope.  Also with mild urinary hesitancy and slower stream for 6 mo. Pt denies new neurological symptoms such as new headache, or facial or extremity weakness or numbness   Past Medical History  Diagnosis Date  . ALLERGIC RHINITIS 04/30/2007  . ANEMIA-NOS 07/12/2007  . ANXIETY 02/17/2009  . 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 DEPRESSIVE DISORDER 02/14/2009  . HYPERLIPIDEMIA 04/27/2007  . HYPERTENSION 04/27/2007  . INSOMNIA UNSPECIFIED 02/20/2010  . 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 Nonspecific abnormal toxicological findings 03/14/2008  . OBESITY 04/30/2007  . OSTEOARTHRITIS 04/30/2007  . 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 History of pulmonary embolism 03/09/2008  . PROSTATITIS, HX OF 04/30/2007  . RESTRICTIVE LUNG DISEASE 06/20/2009  . THORACIC AORTIC ANEURYSM 07/06/2009    Chest CT 11/12: ascending thoracic aortic aneurysm stable at 42 mm.   Marland Kitchen UNSPECIFIED MYALGIA AND MYOSITIS 02/14/2009  . CAD (coronary artery disease)     LHC 11/09 proximal RCA 30%, mid RCA 30%, distal RCA with serial 30-40%, EF 40-45%, normal right heart pressures.  Echo 11/10 mild LVH, EF 55-65%, grade 1 diastolic dysfunction, aortic root moderately dilated, mild LAE.;  Myoview 11/10: Small lateral scar, no ischemia, EF 61%.     Past Surgical History  Procedure Laterality Date  . Appendectomy      reports that he has never smoked. He does not have any smokeless tobacco history on file. He reports that he does not drink alcohol or use illicit drugs. family history includes Alcohol abuse in his other; Diabetes in his other; Heart disease in his other; Hypertension  in his other; Stroke in his other. Allergies  Allergen Reactions  . Atorvastatin     REACTION: myalgias  . Ciprofloxacin Other (See Comments)    myalgias  . Crestor [Rosuvastatin]     myalgias  . Doxycycline   . Hydroxychloroquine Sulfate     REACTION: GI upset and HA   Current Outpatient Prescriptions on File Prior to Visit  Medication Sig Dispense Refill  . aspirin 81 MG tablet Take 81 mg by mouth daily.        . clonazePAM (KLONOPIN) 1 MG tablet take 1 tablet by mouth twice a day if needed for anxiety  60 tablet  3  . dextromethorphan-guaiFENesin (MUCINEX DM) 30-600 MG per 12 hr tablet Take 1 tablet by mouth every 12 (twelve) hours.        . Dextromethorphan-Guaifenesin (TUSSIN DM) 10-100 MG/5ML liquid Take 5 mLs by mouth every 12 (twelve) hours.        . folic acid (FOLVITE) 800 MCG tablet Take by mouth 2 (two) times daily.       . hydroxychloroquine (PLAQUENIL) 200 MG tablet Take 200 mg by mouth 2 (two) times daily.       Marland Kitchen ibuprofen (ADVIL,MOTRIN) 600 MG tablet Take 1 tablet (600 mg total) by mouth every 8 (eight) hours as needed for pain.  90 tablet  2  . leflunomide (ARAVA) 20 MG tablet Take 20 mg by mouth daily.        Marland Kitchen loperamide (IMODIUM A-D) 2 MG tablet Take 2 mg by mouth as needed.        Marland Kitchen  losartan (COZAAR) 25 MG tablet take 3 tablets by mouth once daily  270 tablet  1  . methylPREDNISolone (MEDROL) 4 MG tablet Take 4 mg by mouth daily.       . metoprolol tartrate (LOPRESSOR) 25 MG tablet take 1 tablet by mouth twice a day  180 tablet  3  . Misc Natural Products LIQD Take by mouth.      . Omega-3 Fatty Acids (FISH OIL) 1200 MG CAPS Take by mouth daily.        Marland Kitchen omeprazole (PRILOSEC) 20 MG capsule take 1 capsule by mouth once daily  90 capsule  3  . zolpidem (AMBIEN CR) 12.5 MG CR tablet take 1 tablet by mouth at bedtime if needed for sleep  30 tablet  5   No current facility-administered medications on file prior to visit.   Review of Systems  Constitutional:  Negative for unexpected weight change, or unusual diaphoresis  HENT: Negative for tinnitus.   Eyes: Negative for photophobia and visual disturbance.  Respiratory: Negative for choking and stridor.   Gastrointestinal: Negative for vomiting and blood in stool.  Genitourinary: Negative for hematuria and decreased urine volume.  Musculoskeletal: Negative for acute joint swelling Skin: Negative for color change and wound.  Neurological: Negative for tremors and numbness other than noted  Psychiatric/Behavioral: Negative for decreased concentration or  hyperactivity.       Objective:   Physical Exam BP 120/82  Pulse 104  Temp(Src) 97.4 F (36.3 C) (Oral)  Ht 6' (1.829 m)  Wt 209 lb 2 oz (94.858 kg)  BMI 28.36 kg/m2  SpO2 91% VS noted, mild ill Constitutional: Pt appears well-developed and well-nourished.  HENT: Head: NCAT.  Right Ear: External ear normal.  Left Ear: External ear normal.  Bilat tm's with mild erythema.  Max sinus areas mild tender.  Pharynx with mild erythema, no exudate Eyes: Conjunctivae and EOM are normal. Pupils are equal, round, and reactive to light.  Neck: Normal range of motion. Neck supple.  Cardiovascular: Normal rate and regular rhythm.   Pulmonary/Chest: Effort normal and breath sounds normal.  Neurological: Pt is alert. Not confused  Skin: Skin is warm. No erythema.  Psychiatric: Pt behavior is normal. Thought content normal.     Assessment & Plan:

## 2013-06-17 NOTE — Progress Notes (Signed)
Pre-visit discussion using our clinic review tool. No additional management support is needed unless otherwise documented below in the visit note.  

## 2013-06-17 NOTE — Patient Instructions (Signed)
Please take all new medication as prescribed Please continue all other medications as before, and refills have been done if requested.  Please keep your appointments with your specialists as you have planned  Please remember to sign up for My Chart if you have not done so, as this will be important to you in the future with finding out test results, communicating by private email, and scheduling acute appointments online when needed.

## 2013-06-20 DIAGNOSIS — N400 Enlarged prostate without lower urinary tract symptoms: Secondary | ICD-10-CM | POA: Insufficient documentation

## 2013-06-20 NOTE — Assessment & Plan Note (Signed)
stable overall by history and exam, recent data reviewed with pt, and pt to continue medical treatment as before,  to f/u any worsening symptoms or concerns BP Readings from Last 3 Encounters:  06/17/13 120/82  04/01/13 102/82  01/26/13 120/90

## 2013-06-20 NOTE — Assessment & Plan Note (Signed)
For flomax asd,  to f/u any worsening symptoms or concerns 

## 2013-06-20 NOTE — Assessment & Plan Note (Signed)
Mild to mod, for antibx course,  to f/u any worsening symptoms or concerns 

## 2013-08-29 ENCOUNTER — Other Ambulatory Visit: Payer: Self-pay | Admitting: Internal Medicine

## 2013-10-11 ENCOUNTER — Other Ambulatory Visit: Payer: Self-pay | Admitting: Internal Medicine

## 2013-10-13 ENCOUNTER — Other Ambulatory Visit: Payer: Self-pay | Admitting: Internal Medicine

## 2013-10-13 NOTE — Telephone Encounter (Signed)
Done hardcopy to robin  

## 2013-10-14 NOTE — Telephone Encounter (Signed)
Faxed hardcopy to [facility] [address]

## 2013-10-26 ENCOUNTER — Telehealth: Payer: Self-pay | Admitting: Internal Medicine

## 2013-10-26 NOTE — Telephone Encounter (Signed)
Received 3 pages from [facility], sent to Dr. Jenny Reichmann on 10/26/13/ss.

## 2013-12-13 ENCOUNTER — Other Ambulatory Visit: Payer: Self-pay | Admitting: Internal Medicine

## 2013-12-13 NOTE — Telephone Encounter (Signed)
Done hardcopy to robin  

## 2013-12-14 NOTE — Telephone Encounter (Signed)
Faxed hardcopy to [facility] [address] [place] 

## 2014-02-21 ENCOUNTER — Encounter: Payer: Self-pay | Admitting: Internal Medicine

## 2014-03-09 ENCOUNTER — Other Ambulatory Visit: Payer: Self-pay | Admitting: Dermatology

## 2014-03-29 ENCOUNTER — Telehealth: Payer: Self-pay | Admitting: Internal Medicine

## 2014-03-29 NOTE — Telephone Encounter (Signed)
Pt states he needs his wellness exam before 05/19/14 due to his insurance, is there a place where we can work the pt in??

## 2014-03-29 NOTE — Telephone Encounter (Signed)
Ok to workin at [employer]

## 2014-03-30 NOTE — Telephone Encounter (Signed)
Pt is scheduled for 05/05/14

## 2014-04-04 ENCOUNTER — Other Ambulatory Visit: Payer: Self-pay | Admitting: Internal Medicine

## 2014-04-11 ENCOUNTER — Other Ambulatory Visit: Payer: Self-pay | Admitting: Internal Medicine

## 2014-04-12 NOTE — Telephone Encounter (Signed)
Done hardcopy to robin  

## 2014-04-13 NOTE — Telephone Encounter (Signed)
Faxed hardcopy for zolpidem [facility] [address] [place]

## 2014-05-05 ENCOUNTER — Other Ambulatory Visit (INDEPENDENT_AMBULATORY_CARE_PROVIDER_SITE_OTHER): Payer: BC Managed Care – PPO

## 2014-05-05 ENCOUNTER — Encounter: Payer: Self-pay | Admitting: Internal Medicine

## 2014-05-05 ENCOUNTER — Ambulatory Visit (INDEPENDENT_AMBULATORY_CARE_PROVIDER_SITE_OTHER): Payer: BC Managed Care – PPO | Admitting: Internal Medicine

## 2014-05-05 VITALS — BP 108/70 | HR 82 | Temp 98.2°F | Ht 72.0 in | Wt 205.1 lb

## 2014-05-05 DIAGNOSIS — J069 Acute upper respiratory infection, unspecified: Secondary | ICD-10-CM

## 2014-05-05 DIAGNOSIS — Z0189 Encounter for other specified special examinations: Secondary | ICD-10-CM

## 2014-05-05 DIAGNOSIS — E785 Hyperlipidemia, unspecified: Secondary | ICD-10-CM

## 2014-05-05 DIAGNOSIS — Z0000 Encounter for general adult medical examination without abnormal findings: Secondary | ICD-10-CM

## 2014-05-05 LAB — BASIC METABOLIC PANEL
BUN: 13 mg/dL (ref 6–23)
CALCIUM: 9.5 mg/dL (ref 8.4–10.5)
CHLORIDE: 104 meq/L (ref 96–112)
CO2: 32 mEq/L (ref 19–32)
CREATININE: 0.9 mg/dL (ref 0.4–1.5)
GFR: 93.52 mL/min (ref >60.00)
Glucose, Bld: 93 mg/dL (ref 70–99)
Potassium: 4.2 mEq/L (ref 3.5–5.1)
Sodium: 138 mEq/L (ref 135–145)

## 2014-05-05 LAB — CBC WITH DIFFERENTIAL/PLATELET
BASOS PCT: 0.3 % (ref 0.0–3.0)
Basophils Absolute: 0 10*3/uL (ref 0.0–0.1)
Eosinophils Absolute: 0.2 10*3/uL (ref 0.0–0.7)
Eosinophils Relative: 2 % (ref 0.0–5.0)
HEMATOCRIT: 41.3 % (ref 39.0–52.0)
Hemoglobin: 13.9 g/dL (ref 13.0–17.0)
LYMPHS ABS: 2.3 10*3/uL (ref 0.7–4.0)
Lymphocytes Relative: 21.6 % (ref 12.0–46.0)
MCHC: 33.7 g/dL (ref 30.0–36.0)
MCV: 89.4 fl (ref 78.0–100.0)
Monocytes Absolute: 1 10*3/uL (ref 0.1–1.0)
Monocytes Relative: 9.4 % (ref 3.0–12.0)
NEUTROS ABS: 7.1 10*3/uL (ref 1.4–7.7)
Neutrophils Relative %: 66.7 % (ref 43.0–77.0)
Platelets: 306 10*3/uL (ref 150.0–400.0)
RBC: 4.63 Mil/uL (ref 4.22–5.81)
RDW: 13 % (ref 11.5–15.5)
WBC: 10.7 10*3/uL — ABNORMAL HIGH (ref 4.0–10.5)

## 2014-05-05 LAB — TSH: TSH: 0.75 u[IU]/mL (ref 0.35–4.50)

## 2014-05-05 LAB — HEPATIC FUNCTION PANEL
ALT: 40 U/L (ref 0–53)
AST: 31 U/L (ref 0–37)
Albumin: 4.1 g/dL (ref 3.5–5.2)
Alkaline Phosphatase: 54 U/L (ref 39–117)
BILIRUBIN DIRECT: 0.1 mg/dL (ref 0.0–0.3)
BILIRUBIN TOTAL: 0.6 mg/dL (ref 0.2–1.2)
Total Protein: 7.2 g/dL (ref 6.0–8.3)

## 2014-05-05 LAB — URINALYSIS, ROUTINE W REFLEX MICROSCOPIC
BILIRUBIN URINE: NEGATIVE
Hgb urine dipstick: NEGATIVE
Ketones, ur: NEGATIVE
Leukocytes, UA: NEGATIVE
Nitrite: NEGATIVE
Specific Gravity, Urine: <=1.005 — AB (ref 1.000–1.030)
TOTAL PROTEIN, URINE-UPE24: NEGATIVE
URINE GLUCOSE: NEGATIVE
Urobilinogen, UA: 0.2 (ref 0.0–1.0)
pH: 6 (ref 5.0–8.0)

## 2014-05-05 LAB — LIPID PANEL
CHOL/HDL RATIO: 5
Cholesterol: 208 mg/dL — ABNORMAL HIGH (ref 0–200)
HDL: 38.4 mg/dL — ABNORMAL LOW (ref >39.00)
LDL Cholesterol: 147 mg/dL — ABNORMAL HIGH (ref 0–99)
NONHDL: 169.6
Triglycerides: 112 mg/dL (ref 0.0–149.0)
VLDL: 22.4 mg/dL (ref 0.0–40.0)

## 2014-05-05 LAB — PSA: PSA: 1 ng/mL (ref 0.10–4.00)

## 2014-05-05 MED ORDER — CELECOXIB 200 MG PO CAPS: 200.0000 mg | ORAL_CAPSULE | Freq: Two times a day (BID) | ORAL | Status: DC | PRN

## 2014-05-05 MED ORDER — SULFAMETHOXAZOLE-TRIMETHOPRIM 800-160 MG PO TABS: 1.0000 | ORAL_TABLET | Freq: Two times a day (BID) | ORAL | Status: DC

## 2014-05-05 NOTE — Assessment & Plan Note (Signed)

## 2014-05-05 NOTE — Patient Instructions (Addendum)
Please take all new medication as prescribed - the antibiotic, and the celebrex  You will be contacted regarding the referral for: Dr Olevia Perches and colonoscopy  Your  EKG was OK today  OK to stop the ibuprofen  Please continue all other medications as before, and refills have been done if requested.  Please have the pharmacy call with any other refills you may need.  Please continue your efforts at being more active, low cholesterol diet, and weight control.  You are otherwise up to date with prevention measures today.  Please keep your appointments with your specialists as you may have planned  Please go to the LAB in the Basement (turn left off the elevator) for the tests to be done today  You will be contacted by phone if any changes need to be made immediately.  Otherwise, you will receive a letter about your results with an explanation, but please check with MyChart first.  Please remember to sign up for MyChart if you have not done so, as this will be important to you in the future with finding out test results, communicating by private email, and scheduling acute appointments online when needed.  Please return in 1 year for your yearly visit, or sooner if needed, with Lab testing done 3-5 days before

## 2014-05-05 NOTE — Progress Notes (Signed)
Pre visit review using our clinic review tool, if applicable. No additional management support is needed unless otherwise documented below in the visit note. 

## 2014-05-05 NOTE — Progress Notes (Signed)
Subjective:    Patient ID: [name] Michael, male    DOB: 1951-04-04, 63 y.o.   MRN: [id number]  HPI  Here for wellness and f/u;  Overall doing ok;  Pt denies CP, worsening SOB, DOE, wheezing, orthopnea, PND, worsening LE edema, palpitations, dizziness or syncope.  Pt denies neurological change such as new headache, facial or extremity weakness.  Pt denies polydipsia, polyuria, or low sugar symptoms. Pt states overall good compliance with treatment and medications, good tolerability, and has been trying to follow lower cholesterol diet.  Pt denies worsening depressive symptoms, suicidal ideation or panic. No fever, night sweats, wt loss, loss of appetite, or other constitutional symptoms.  Pt states good ability with ADL's, has low fall risk, home safety reviewed and adequate, no other significant changes in hearing or vision, and only occasionally active with exercise. BP seemed to improved, possibly related to wt loss, now on current meds only. Also with nocturia 3-4 times per night, with some discomfort x 2 wks, despite the flomax.  Has some ongoing mild GI upset and diarrhea related to the plaquinil, also taking ariva. . Seeing optho yearly.  Asks for celebrex nsaid, now generi.  Sees Dr Veva Holes, plans to f/u in Nov 2015.   Also0 with 2-3 days acute onset fever, facial pain, pressure, headache, general weakness and malaise, and greenish d/c  Past Medical History  Diagnosis Date  . ALLERGIC RHINITIS 04/30/2007  . ANEMIA-NOS 07/12/2007  . ANXIETY 02/17/2009  . 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 DEPRESSIVE DISORDER 02/14/2009  . HYPERLIPIDEMIA 04/27/2007  . HYPERTENSION 04/27/2007  . INSOMNIA UNSPECIFIED 02/20/2010  . 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 Nonspecific abnormal toxicological findings 03/14/2008  . OBESITY 04/30/2007  . OSTEOARTHRITIS 04/30/2007  . 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 History of pulmonary embolism 03/09/2008  . PROSTATITIS, HX OF 04/30/2007  . RESTRICTIVE LUNG DISEASE  06/20/2009  . THORACIC AORTIC ANEURYSM 07/06/2009    Chest CT 11/12: ascending thoracic aortic aneurysm stable at 42 mm.   Marland Kitchen UNSPECIFIED MYALGIA AND MYOSITIS 02/14/2009  . CAD (coronary artery disease)     LHC 11/09 proximal RCA 30%, mid RCA 30%, distal RCA with serial 30-40%, EF 40-45%, normal right heart pressures.  Echo 11/10 mild LVH, EF 35-36%, grade 1 diastolic dysfunction, aortic root moderately dilated, mild LAE.;  Myoview 11/10: Small lateral scar, no ischemia, EF 61%.     Past Surgical History  Procedure Laterality Date  . Appendectomy      reports that he has never smoked. He does not have any smokeless tobacco history on file. He reports that he does not drink alcohol or use illicit drugs. family history includes Alcohol abuse in his other; Diabetes in his other; Heart disease in his other; Hypertension in his other; Stroke in his other. Allergies  Allergen Reactions  . Atorvastatin     REACTION: myalgias  . Ciprofloxacin Other (See Comments)    myalgias  . Crestor [Rosuvastatin]     myalgias  . Doxycycline   . Hydroxychloroquine Sulfate     REACTION: GI upset and HA   Current Outpatient Prescriptions on File Prior to Visit  Medication Sig Dispense Refill  . aspirin 81 MG tablet Take 81 mg by mouth daily.        . clonazePAM (KLONOPIN) 1 MG tablet take 1 tablet by mouth twice a day if needed for anxiety  60 tablet  3  . dextromethorphan-guaiFENesin (MUCINEX DM) 30-600 MG per 12 hr tablet Take 1 tablet by mouth every  12 (twelve) hours.        . folic acid (FOLVITE) 161 MCG tablet Take by mouth 2 (two) times daily.       . hydroxychloroquine (PLAQUENIL) 200 MG tablet Take 200 mg by mouth 2 (two) times daily.       Marland Kitchen ibuprofen (ADVIL,MOTRIN) 600 MG tablet take 1 tablet by mouth every 8 hours if needed pain  90 tablet  1  . leflunomide (ARAVA) 20 MG tablet Take 20 mg by mouth daily.        Marland Kitchen loperamide (IMODIUM A-D) 2 MG tablet Take 2 mg by mouth as needed.        Marland Kitchen losartan  (COZAAR) 25 MG tablet take 3 tablets by mouth once daily  270 tablet  0  . methylPREDNISolone (MEDROL) 4 MG tablet Take 4 mg by mouth daily.       . metoprolol tartrate (LOPRESSOR) 25 MG tablet take 1 tablet by mouth twice a day  180 tablet  0  . Misc Natural Products LIQD Take by mouth.      . Omega-3 Fatty Acids (FISH OIL) 1200 MG CAPS Take by mouth daily.        Marland Kitchen omeprazole (PRILOSEC) 20 MG capsule take 1 capsule by mouth once daily  90 capsule  0  . tamsulosin (FLOMAX) 0.4 MG CAPS capsule Take 1 capsule (0.4 mg total) by mouth daily.  90 capsule  3  . zolpidem (AMBIEN CR) 12.5 MG CR tablet take 1 tablet by mouth at bedtime if needed for sleep  30 tablet  2   No current facility-administered medications on file prior to visit.   Wt Readings from Last 3 Encounters:  05/05/14 205 lb 2 oz (93.044 kg)  06/17/13 209 lb 2 oz (94.858 kg)  04/01/13 206 lb 4 oz (93.554 kg)    Review of Systems Constitutional: Negative for increased diaphoresis, other activity, appetite or other siginficant weight change  HENT: Negative for worsening hearing loss, ear pain, facial swelling, mouth sores and neck stiffness.   Eyes: Negative for other worsening pain, redness or visual disturbance.  Respiratory: Negative for shortness of breath and wheezing.   Cardiovascular: Negative for chest pain and palpitations.  Gastrointestinal: Negative for diarrhea, blood in stool, abdominal distention or other pain Genitourinary: Negative for hematuria, flank pain or change in urine volume.  Musculoskeletal: Negative for myalgias or other joint complaints.  Skin: Negative for color change and wound.  Neurological: Negative for syncope and numbness. other than noted Hematological: Negative for adenopathy. or other swelling Psychiatric/Behavioral: Negative for hallucinations, self-injury, decreased concentration or other worsening agitation.      Objective:   Physical Exam BP 108/70  Pulse 82  Temp(Src) 98.2 F  (36.8 C) (Oral)  Ht 6' (1.829 m)  Wt 205 lb 2 oz (93.044 kg)  BMI 27.81 kg/m2  SpO2 96% VS noted,  Constitutional: Pt is oriented to person, place, and time. Appears well-developed and well-nourished.  Head: Normocephalic and atraumatic.  Right Ear: External ear normal.  Left Ear: External ear normal.  Nose: Nose normal.  Mouth/Throat: Oropharynx is clear and moist.  Bilat tm's with mild erythema.  Max sinus areas non tender.  Pharynx with mild erythema, no exudate Eyes: Conjunctivae and EOM are normal. Pupils are equal, round, and reactive to light.  Neck: Normal range of motion. Neck supple. No JVD present. No tracheal deviation present.  Cardiovascular: Normal rate, regular rhythm, normal heart sounds and intact distal pulses.   Pulmonary/Chest: Effort  normal and breath sounds without rales or wheezing  Abdominal: Soft. Bowel sounds are normal. NT. No HSM  Musculoskeletal: Normal range of motion. Exhibits no edema.  Lymphadenopathy:  Has no cervical adenopathy.  Neurological: Pt is alert and oriented to person, place, and time. Pt has normal reflexes. No cranial nerve deficit. Motor grossly intact Skin: Skin is warm and dry. No rash noted.  Psychiatric:  Has normal mood and affect. Behavior is normal.  No acute synovitis    Assessment & Plan:

## 2014-05-08 DIAGNOSIS — J069 Acute upper respiratory infection, unspecified: Secondary | ICD-10-CM | POA: Insufficient documentation

## 2014-05-08 NOTE — Assessment & Plan Note (Signed)
Mild to mod, for antibx course,  to f/u any worsening symptoms or concerns 

## 2014-05-10 ENCOUNTER — Encounter: Payer: Self-pay | Admitting: Internal Medicine

## 2014-05-25 ENCOUNTER — Encounter: Payer: Self-pay | Admitting: Internal Medicine

## 2014-06-05 ENCOUNTER — Other Ambulatory Visit: Payer: Self-pay

## 2014-06-08 ENCOUNTER — Telehealth: Payer: Self-pay | Admitting: Internal Medicine

## 2014-06-08 ENCOUNTER — Encounter: Payer: Self-pay | Admitting: Internal Medicine

## 2014-06-08 NOTE — Telephone Encounter (Signed)
Pt called in said that his infection is still not gone.  He said that he took last pill of antibiotic today.  Last time this happened he said that he had to call in and get another 2 weeks of antibiotic.

## 2014-06-08 NOTE — Telephone Encounter (Signed)
Called left message to call back 

## 2014-06-08 NOTE — Telephone Encounter (Signed)
The WBC with the last lab work was OK, and if no further fever, can safely go without further antibx at this time, as it may have actually been viral in the first place.  OK to take mucinex, tylenol prn, and call for worsening fever, pain, cough, sob.

## 2014-06-09 ENCOUNTER — Other Ambulatory Visit: Payer: Self-pay

## 2014-06-09 MED ORDER — TAMSULOSIN HCL 0.4 MG PO CAPS: 0.4000 mg | ORAL_CAPSULE | Freq: Every day | ORAL | Status: DC

## 2014-06-09 MED ORDER — SULFAMETHOXAZOLE-TRIMETHOPRIM 800-160 MG PO TABS: 1.0000 | ORAL_TABLET | Freq: Two times a day (BID) | ORAL | Status: DC

## 2014-06-09 MED ORDER — OMEPRAZOLE 20 MG PO CPDR: 20.0000 mg | DELAYED_RELEASE_CAPSULE | Freq: Every day | ORAL | Status: DC

## 2014-06-09 NOTE — Addendum Note (Signed)
Addended by: Biagio Borg on: 06/09/2014 08:59 PM   Modules accepted: Orders

## 2014-06-09 NOTE — Telephone Encounter (Signed)
Called the patient back left a detailed message of MD instructions.

## 2014-06-09 NOTE — Addendum Note (Signed)
Addended by: Sharon Seller B on: 06/09/2014 01:20 PM   Modules accepted: Orders

## 2014-06-09 NOTE — Telephone Encounter (Signed)
The patient is referring to the prostate issue he had.  He states the last time he had this it took another round of antibiotics to complete clear it up.

## 2014-06-09 NOTE — Telephone Encounter (Signed)
Ok done erx 

## 2014-06-28 ENCOUNTER — Other Ambulatory Visit: Payer: Self-pay | Admitting: Internal Medicine

## 2014-07-14 ENCOUNTER — Other Ambulatory Visit: Payer: Self-pay | Admitting: Internal Medicine

## 2014-07-15 NOTE — Telephone Encounter (Signed)
Faxed hardcopy for zolpidem to [place].

## 2014-07-15 NOTE — Telephone Encounter (Signed)
Done hardcopy to robin  

## 2014-08-12 ENCOUNTER — Encounter: Payer: BC Managed Care – PPO | Admitting: Internal Medicine

## 2014-08-13 ENCOUNTER — Other Ambulatory Visit: Payer: Self-pay | Admitting: Internal Medicine

## 2014-08-16 NOTE — Telephone Encounter (Signed)
Done hardcopy to [employer]

## 2014-08-16 NOTE — Telephone Encounter (Signed)
Script faxed to [facility]. [id number]

## 2014-10-10 ENCOUNTER — Other Ambulatory Visit: Payer: Self-pay | Admitting: Internal Medicine

## 2014-10-11 NOTE — Telephone Encounter (Signed)
Done hardcopy to [facility] 

## 2014-10-12 ENCOUNTER — Encounter: Payer: Self-pay | Admitting: Internal Medicine

## 2014-10-12 NOTE — Telephone Encounter (Signed)
rx done

## 2014-10-17 ENCOUNTER — Telehealth: Payer: Self-pay | Admitting: Internal Medicine

## 2014-10-17 NOTE — Telephone Encounter (Signed)
Rec'd from [facility] forward 3 pages to [place]

## 2015-01-09 ENCOUNTER — Encounter: Payer: Self-pay | Admitting: Internal Medicine

## 2015-01-10 ENCOUNTER — Other Ambulatory Visit: Payer: Self-pay | Admitting: Internal Medicine

## 2015-01-11 NOTE — Telephone Encounter (Signed)
Rx faxed to pharmacy  

## 2015-01-11 NOTE — Telephone Encounter (Signed)
Done hardcopy to [facility]  

## 2015-03-19 ENCOUNTER — Ambulatory Visit (INDEPENDENT_AMBULATORY_CARE_PROVIDER_SITE_OTHER): Payer: BLUE CROSS/BLUE SHIELD | Admitting: Emergency Medicine

## 2015-03-19 VITALS — BP 140/74 | HR 84 | Temp 98.2°F | Resp 18 | Ht 71.0 in | Wt 202.6 lb

## 2015-03-19 DIAGNOSIS — J0140 Acute pansinusitis, unspecified: Secondary | ICD-10-CM

## 2015-03-19 DIAGNOSIS — J209 Acute bronchitis, unspecified: Secondary | ICD-10-CM

## 2015-03-19 MED ORDER — AMOXICILLIN-POT CLAVULANATE 875-125 MG PO TABS: 1.0000 | ORAL_TABLET | Freq: Two times a day (BID) | ORAL | Status: DC

## 2015-03-19 MED ORDER — HYDROCOD POLST-CPM POLST ER 10-8 MG/5ML PO SUER: 5.0000 mL | Freq: Two times a day (BID) | ORAL | Status: DC

## 2015-03-19 MED ORDER — PSEUDOEPHEDRINE-GUAIFENESIN ER 60-600 MG PO TB12: 1.0000 | ORAL_TABLET | Freq: Two times a day (BID) | ORAL | Status: AC

## 2015-03-19 NOTE — Patient Instructions (Signed)

## 2015-03-19 NOTE — Progress Notes (Signed)
Subjective:  Patient ID: [name] Michael, male    DOB: 20-Jul-1951  Age: 64 y.o. MRN: [id number]  CC: Headache; Nasal Congestion; and Cough   HPI Omare Bilotta presents   He has nasal congestion postnasal drainage and a cough. He has purulent nasal drainage and postnasal drainage. He has a cough productive of purulent sputum. No wheezing or shortness breath was noted. He has no fever or chills. He has no nausea vomiting. No stool change. No improvement with over-the-counter medication  History Domingos has a past medical history of ALLERGIC RHINITIS (04/30/2007); ANEMIA-NOS (07/12/2007); ANXIETY (02/17/2009); ARTHRITIS, RHEUMATOID (02/17/2009); DEPRESSIVE DISORDER (02/14/2009); HYPERLIPIDEMIA (04/27/2007); HYPERTENSION (04/27/2007); INSOMNIA UNSPECIFIED (02/20/2010); MALLORY-WEISS SYNDROME (04/27/2007); Nonspecific abnormal toxicological findings (03/14/2008); OBESITY (04/30/2007); OSTEOARTHRITIS (04/30/2007); PEPTIC ULCER DISEASE (07/12/2007); History of pulmonary embolism (03/09/2008); PROSTATITIS, HX OF (04/30/2007); RESTRICTIVE LUNG DISEASE (06/20/2009); THORACIC AORTIC ANEURYSM (07/06/2009); UNSPECIFIED MYALGIA AND MYOSITIS (02/14/2009); and CAD (coronary artery disease).   He has past surgical history that includes Appendectomy.   His  family history includes Alcohol abuse in his other; Diabetes in his brother, brother, father, mother, and other; Heart disease in his brother, father, mother, and other; Hyperlipidemia in his father and mother; Hypertension in his brother, other, and sister; Stroke in his father, mother, and other.  He   reports that he has never smoked. He does not have any smokeless tobacco history on file. He reports that he does not drink alcohol or use illicit drugs.  Outpatient Prescriptions Prior to Visit  Medication Sig Dispense Refill  . aspirin 81 MG tablet Take 81 mg by mouth daily.      . celecoxib (CELEBREX) 200 MG capsule Take 1 capsule (200 mg total) by mouth 2 (two) times  daily as needed. 60 capsule 11  . Cholecalciferol (VITAMIN D) 2000 UNITS CAPS Take by mouth daily.    . clonazePAM (KLONOPIN) 1 MG tablet take 1 tablet by mouth twice a day if needed for anxiety 60 tablet 3  . dextromethorphan-guaiFENesin (MUCINEX DM) 30-600 MG per 12 hr tablet Take 1 tablet by mouth every 12 (twelve) hours.      . folic acid (FOLVITE) 027 MCG tablet Take by mouth 2 (two) times daily.     . hydroxychloroquine (PLAQUENIL) 200 MG tablet Take 200 mg by mouth 2 (two) times daily.     Marland Kitchen ibuprofen (ADVIL,MOTRIN) 600 MG tablet take 1 tablet by mouth every 8 hours if needed pain 90 tablet 1  . leflunomide (ARAVA) 20 MG tablet Take 20 mg by mouth daily.      Marland Kitchen loperamide (IMODIUM A-D) 2 MG tablet Take 2 mg by mouth as needed.      Marland Kitchen losartan (COZAAR) 25 MG tablet take 3 tablets by mouth once daily 270 tablet 0  . methylPREDNISolone (MEDROL) 4 MG tablet Take 4 mg by mouth daily.     . metoprolol tartrate (LOPRESSOR) 25 MG tablet take 1 tablet by mouth twice a day 180 tablet 3  . Misc Natural Products LIQD Take by mouth.    . Omega-3 Fatty Acids (FISH OIL) 1200 MG CAPS Take by mouth daily.      Marland Kitchen omeprazole (PRILOSEC) 20 MG capsule Take 1 capsule (20 mg total) by mouth daily. 90 capsule 3  . Specialty Vitamins Products (ONE-A-DAY ENERGY FORMULA) TABS Take by mouth daily.    . tamsulosin (FLOMAX) 0.4 MG CAPS capsule Take 1 capsule (0.4 mg total) by mouth daily. 90 capsule 3  . zolpidem (AMBIEN CR) 12.5 MG CR tablet take  1 tablet by mouth at bedtime if needed for sleep 30 tablet 2  . sulfamethoxazole-trimethoprim (SEPTRA DS) 800-160 MG per tablet Take 1 tablet by mouth 2 (two) times daily. (Patient not taking: Reported on 03/19/2015) 60 tablet 0   No facility-administered medications prior to visit.    Social History   Social History  . Marital Status: Married    Spouse Name: N/A  . Number of Children: N/A  . Years of Education: N/A   Social History Main Topics  . Smoking status:  Never Smoker   . Smokeless tobacco: None  . Alcohol Use: No  . Drug Use: No  . Sexual Activity: Not Asked   Other Topics Concern  . None   Social History Narrative     Review of Systems  Constitutional: Negative for fever, chills and appetite change.  HENT: Negative for congestion, ear pain, postnasal drip, sinus pressure and sore throat.   Eyes: Negative for pain and redness.  Respiratory: Negative for cough, shortness of breath and wheezing.   Cardiovascular: Negative for leg swelling.  Gastrointestinal: Negative for nausea, vomiting, abdominal pain, diarrhea, constipation and blood in stool.  Endocrine: Negative for polyuria.  Genitourinary: Negative for dysuria, urgency, frequency and flank pain.  Musculoskeletal: Negative for gait problem.  Skin: Negative for rash.  Neurological: Negative for weakness and headaches.  Psychiatric/Behavioral: Negative for confusion and decreased concentration. The patient is not nervous/anxious.     Objective:  BP 140/74 mmHg  Pulse 84  Temp(Src) 98.2 F (36.8 C) (Oral)  Resp 18  Ht 5\' 11"  (1.803 m)  Wt 202 lb 9.6 oz (91.899 kg)  BMI 28.27 kg/m2  SpO2 95%  Physical Exam  Constitutional: He is oriented to person, place, and time. He appears well-developed and well-nourished. No distress.  HENT:  Head: Normocephalic and atraumatic.  Right Ear: External ear normal.  Left Ear: External ear normal.  Nose: Nose normal.  Eyes: Conjunctivae and EOM are normal. Pupils are equal, round, and reactive to light. No scleral icterus.  Neck: Normal range of motion. Neck supple. No tracheal deviation present.  Cardiovascular: Normal rate, regular rhythm and normal heart sounds.   Pulmonary/Chest: Effort normal. No respiratory distress. He has no wheezes. He has no rales.  Abdominal: He exhibits no mass. There is no tenderness. There is no rebound and no guarding.  Musculoskeletal: He exhibits no edema.  Lymphadenopathy:    He has no cervical  adenopathy.  Neurological: He is alert and oriented to person, place, and time. Coordination normal.  Skin: Skin is warm and dry. No rash noted.  Psychiatric: He has a normal mood and affect. His behavior is normal.      Assessment & Plan:   Cleland was seen today for headache, nasal congestion and cough.  Diagnoses and all orders for this visit:  Acute bronchitis, unspecified organism  Acute pansinusitis, recurrence not specified  Other orders -     amoxicillin-clavulanate (AUGMENTIN) 875-125 MG per tablet; Take 1 tablet by mouth 2 (two) times daily. -     chlorpheniramine-HYDROcodone (TUSSIONEX PENNKINETIC ER) 10-8 MG/5ML SUER; Take 5 mLs by mouth 2 (two) times daily. -     pseudoephedrine-guaifenesin (MUCINEX D) 60-600 MG per tablet; Take 1 tablet by mouth every 12 (twelve) hours.   I am having Mr. Ashmore start on amoxicillin-clavulanate, chlorpheniramine-HYDROcodone, and pseudoephedrine-guaifenesin. I am also having him maintain his aspirin, Fish Oil, folic acid, hydroxychloroquine, loperamide, leflunomide, methylPREDNISolone, dextromethorphan-guaiFENesin, Misc Natural Products, ibuprofen, losartan, Vitamin D, ONE-A-DAY ENERGY FORMULA,  celecoxib, tamsulosin, omeprazole, sulfamethoxazole-trimethoprim, metoprolol tartrate, clonazePAM, and zolpidem.  Meds ordered this encounter  Medications  . amoxicillin-clavulanate (AUGMENTIN) 875-125 MG per tablet    Sig: Take 1 tablet by mouth 2 (two) times daily.    Dispense:  20 tablet    Refill:  0  . chlorpheniramine-HYDROcodone (TUSSIONEX PENNKINETIC ER) 10-8 MG/5ML SUER    Sig: Take 5 mLs by mouth 2 (two) times daily.    Dispense:  60 mL    Refill:  0  . pseudoephedrine-guaifenesin (MUCINEX D) 60-600 MG per tablet    Sig: Take 1 tablet by mouth every 12 (twelve) hours.    Dispense:  18 tablet    Refill:  0    Appropriate red flag conditions were discussed with the patient as well as actions that should be taken.  Patient  expressed his understanding.  Follow-up: Return if symptoms worsen or fail to improve.  Roselee Culver, MD

## 2015-04-11 ENCOUNTER — Other Ambulatory Visit: Payer: Self-pay | Admitting: Internal Medicine

## 2015-04-12 NOTE — Telephone Encounter (Signed)
Rx faxed to pharmacy  

## 2015-04-12 NOTE — Telephone Encounter (Signed)
Done hardcopy to [facility]  

## 2015-04-22 ENCOUNTER — Ambulatory Visit (INDEPENDENT_AMBULATORY_CARE_PROVIDER_SITE_OTHER): Payer: BLUE CROSS/BLUE SHIELD | Admitting: Emergency Medicine

## 2015-04-22 VITALS — BP 124/82 | HR 71 | Temp 98.1°F | Resp 16 | Ht 71.0 in | Wt 204.0 lb

## 2015-04-22 DIAGNOSIS — S0085XA Superficial foreign body of other part of head, initial encounter: Secondary | ICD-10-CM

## 2015-04-22 DIAGNOSIS — Z23 Encounter for immunization: Secondary | ICD-10-CM

## 2015-04-22 DIAGNOSIS — R519 Headache, unspecified: Secondary | ICD-10-CM

## 2015-04-22 DIAGNOSIS — R51 Headache: Secondary | ICD-10-CM

## 2015-04-22 NOTE — Patient Instructions (Signed)
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
## 2015-05-09 ENCOUNTER — Other Ambulatory Visit: Payer: Self-pay | Admitting: Internal Medicine

## 2015-05-26 ENCOUNTER — Encounter: Payer: Self-pay | Admitting: Internal Medicine

## 2015-05-26 ENCOUNTER — Other Ambulatory Visit (INDEPENDENT_AMBULATORY_CARE_PROVIDER_SITE_OTHER): Payer: BLUE CROSS/BLUE SHIELD

## 2015-05-26 ENCOUNTER — Ambulatory Visit (INDEPENDENT_AMBULATORY_CARE_PROVIDER_SITE_OTHER): Payer: BLUE CROSS/BLUE SHIELD | Admitting: Internal Medicine

## 2015-05-26 VITALS — BP 120/86 | HR 68 | Temp 98.0°F | Wt 202.0 lb

## 2015-05-26 DIAGNOSIS — Z23 Encounter for immunization: Secondary | ICD-10-CM

## 2015-05-26 DIAGNOSIS — Z0000 Encounter for general adult medical examination without abnormal findings: Secondary | ICD-10-CM | POA: Diagnosis not present

## 2015-05-26 DIAGNOSIS — I10 Essential (primary) hypertension: Secondary | ICD-10-CM

## 2015-05-26 LAB — BASIC METABOLIC PANEL
BUN: 16 mg/dL (ref 6–23)
CHLORIDE: 106 meq/L (ref 96–112)
CO2: 29 meq/L (ref 19–32)
CREATININE: 0.93 mg/dL (ref 0.40–1.50)
Calcium: 9.8 mg/dL (ref 8.4–10.5)
GFR: 87.44 mL/min (ref >60.00)
GLUCOSE: 104 mg/dL — AB (ref 70–99)
Potassium: 4.5 mEq/L (ref 3.5–5.1)
Sodium: 143 mEq/L (ref 135–145)

## 2015-05-26 LAB — CBC WITH DIFFERENTIAL/PLATELET
BASOS ABS: 0.1 10*3/uL (ref 0.0–0.1)
Basophils Relative: 0.8 % (ref 0.0–3.0)
EOS ABS: 0.2 10*3/uL (ref 0.0–0.7)
Eosinophils Relative: 2.8 % (ref 0.0–5.0)
HEMATOCRIT: 42.6 % (ref 39.0–52.0)
Hemoglobin: 14.1 g/dL (ref 13.0–17.0)
LYMPHS ABS: 2.2 10*3/uL (ref 0.7–4.0)
LYMPHS PCT: 25.4 % (ref 12.0–46.0)
MCHC: 33 g/dL (ref 30.0–36.0)
MCV: 89.8 fl (ref 78.0–100.0)
Monocytes Absolute: 1.1 10*3/uL — ABNORMAL HIGH (ref 0.1–1.0)
Monocytes Relative: 11.9 % (ref 3.0–12.0)
NEUTROS ABS: 5.2 10*3/uL (ref 1.4–7.7)
NEUTROS PCT: 59.1 % (ref 43.0–77.0)
PLATELETS: 292 10*3/uL (ref 150.0–400.0)
RBC: 4.74 Mil/uL (ref 4.22–5.81)
RDW: 13.6 % (ref 11.5–15.5)
WBC: 8.8 10*3/uL (ref 4.0–10.5)

## 2015-05-26 LAB — URINALYSIS, ROUTINE W REFLEX MICROSCOPIC
Bilirubin Urine: NEGATIVE
Hgb urine dipstick: NEGATIVE
KETONES UR: NEGATIVE
Leukocytes, UA: NEGATIVE
Nitrite: NEGATIVE
PH: 6 (ref 5.0–8.0)
RBC / HPF: NONE SEEN (ref 0–?)
SPECIFIC GRAVITY, URINE: 1.01 (ref 1.000–1.030)
TOTAL PROTEIN, URINE-UPE24: NEGATIVE
URINE GLUCOSE: NEGATIVE
UROBILINOGEN UA: 0.2 (ref 0.0–1.0)
WBC, UA: NONE SEEN (ref 0–?)

## 2015-05-26 LAB — TSH: TSH: 0.72 u[IU]/mL (ref 0.35–4.50)

## 2015-05-26 LAB — HEPATIC FUNCTION PANEL
ALBUMIN: 4.2 g/dL (ref 3.5–5.2)
ALT: 33 U/L (ref 0–53)
AST: 27 U/L (ref 0–37)
Alkaline Phosphatase: 55 U/L (ref 39–117)
BILIRUBIN DIRECT: 0.1 mg/dL (ref 0.0–0.3)
TOTAL PROTEIN: 6.7 g/dL (ref 6.0–8.3)
Total Bilirubin: 0.7 mg/dL (ref 0.2–1.2)

## 2015-05-26 LAB — LIPID PANEL
CHOL/HDL RATIO: 5
Cholesterol: 201 mg/dL — ABNORMAL HIGH (ref 0–200)
HDL: 39.9 mg/dL (ref >39.00)
LDL Cholesterol: 135 mg/dL — ABNORMAL HIGH (ref 0–99)
NONHDL: 161.58
Triglycerides: 135 mg/dL (ref 0.0–149.0)
VLDL: 27 mg/dL (ref 0.0–40.0)

## 2015-05-26 LAB — PSA: PSA: 1.12 ng/mL (ref 0.10–4.00)

## 2015-05-26 MED ORDER — ZOLPIDEM TARTRATE ER 12.5 MG PO TBCR: EXTENDED_RELEASE_TABLET | ORAL | Status: DC

## 2015-05-26 MED ORDER — METOPROLOL TARTRATE 25 MG PO TABS: 25.0000 mg | ORAL_TABLET | Freq: Two times a day (BID) | ORAL | Status: DC

## 2015-05-26 MED ORDER — LOSARTAN POTASSIUM 25 MG PO TABS: 25.0000 mg | ORAL_TABLET | Freq: Two times a day (BID) | ORAL | Status: DC

## 2015-05-26 MED ORDER — CLONAZEPAM 1 MG PO TABS: ORAL_TABLET | ORAL | Status: DC

## 2015-05-26 MED ORDER — PANTOPRAZOLE SODIUM 40 MG PO TBEC: 40.0000 mg | DELAYED_RELEASE_TABLET | Freq: Every day | ORAL | Status: DC

## 2015-05-26 MED ORDER — CELECOXIB 200 MG PO CAPS: ORAL_CAPSULE | ORAL | Status: DC

## 2015-05-26 MED ORDER — TAMSULOSIN HCL 0.4 MG PO CAPS: 0.4000 mg | ORAL_CAPSULE | Freq: Every day | ORAL | Status: DC

## 2015-05-26 NOTE — Progress Notes (Signed)
Subjective:    Patient ID: [name] Michael, male    DOB: April 23, 1951, 64 y.o.   MRN: [id number]  HPI  Here for wellness and f/u;  Overall doing ok;  Pt denies Chest pain, worsening SOB, DOE, wheezing, orthopnea, PND, worsening LE edema, palpitations, dizziness or syncope.  Pt denies neurological change such as new headache, facial or extremity weakness.  Pt denies polydipsia, polyuria, or low sugar symptoms. Pt states overall good compliance with treatment and medications, good tolerability, and has been trying to follow appropriate diet.  Pt denies worsening depressive symptoms, suicidal ideation or panic. No fever, night sweats, wt loss, loss of appetite, or other constitutional symptoms.  Pt states good ability with ADL's, has low fall risk, home safety reviewed and adequate, no other significant changes in hearing or vision, and only occasionally active with exercise. Due for colonoscopy; had to re-schedule due to work conflict.  Has had mild worsening reflux in the psat 2 mo better with inreased otc prilosec, but no abd pain, dysphagia, n/v, bowel change or blood.  Sees rheum on regular basis on mult meds incuding steroid daily.  Has not had dxa.   Past Medical History  Diagnosis Date  . ALLERGIC RHINITIS 04/30/2007  . ANEMIA-NOS 07/12/2007  . ANXIETY 02/17/2009  . 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 DEPRESSIVE DISORDER 02/14/2009  . HYPERLIPIDEMIA 04/27/2007  . HYPERTENSION 04/27/2007  . INSOMNIA UNSPECIFIED 02/20/2010  . 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 Nonspecific abnormal toxicological findings 03/14/2008  . OBESITY 04/30/2007  . OSTEOARTHRITIS 04/30/2007  . 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 History of pulmonary embolism 03/09/2008  . PROSTATITIS, HX OF 04/30/2007  . RESTRICTIVE LUNG DISEASE 06/20/2009  . THORACIC AORTIC ANEURYSM 07/06/2009    Chest CT 11/12: ascending thoracic aortic aneurysm stable at 42 mm.   Marland Kitchen UNSPECIFIED MYALGIA AND MYOSITIS 02/14/2009  . CAD (coronary artery disease)    LHC 11/09 proximal RCA 30%, mid RCA 30%, distal RCA with serial 30-40%, EF 40-45%, normal right heart pressures.  Echo 11/10 mild LVH, EF 72-62%, grade 1 diastolic dysfunction, aortic root moderately dilated, mild LAE.;  Myoview 11/10: Small lateral scar, no ischemia, EF 61%.     Past Surgical History  Procedure Laterality Date  . Appendectomy      reports that he has never smoked. He does not have any smokeless tobacco history on file. He reports that he does not drink alcohol or use illicit drugs. family history includes Alcohol abuse in his other; Diabetes in his brother, brother, father, mother, and other; Heart disease in his brother, father, mother, and other; Hyperlipidemia in his father and mother; Hypertension in his brother, other, and sister; Stroke in his father, mother, and other. Allergies  Allergen Reactions  . Atorvastatin     REACTION: myalgias  . Ciprofloxacin Other (See Comments)    myalgias  . Crestor [Rosuvastatin]     myalgias  . Doxycycline   . Hydroxychloroquine Sulfate     REACTION: GI upset and HA   Current Outpatient Prescriptions on File Prior to Visit  Medication Sig Dispense Refill  . aspirin 81 MG tablet Take 81 mg by mouth daily.      . Cholecalciferol (VITAMIN D) 2000 UNITS CAPS Take by mouth daily.    . folic acid (FOLVITE) 035 MCG tablet Take by mouth 2 (two) times daily.     . hydroxychloroquine (PLAQUENIL) 200 MG tablet Take 200 mg by mouth 2 (two) times daily.     Marland Kitchen ibuprofen (ADVIL,MOTRIN) 600 MG  tablet take 1 tablet by mouth every 8 hours if needed pain 90 tablet 1  . leflunomide (ARAVA) 20 MG tablet Take 20 mg by mouth daily.      Marland Kitchen loperamide (IMODIUM A-D) 2 MG tablet Take 2 mg by mouth as needed.      . methylPREDNISolone (MEDROL) 4 MG tablet Take 4 mg by mouth daily.     . Misc Natural Products LIQD Take by mouth.    . Omega-3 Fatty Acids (FISH OIL) 1200 MG CAPS Take by mouth daily.      . pseudoephedrine-guaifenesin (MUCINEX D) 60-600 MG  per tablet Take 1 tablet by mouth every 12 (twelve) hours. 18 tablet 0  . Specialty Vitamins Products (ONE-A-DAY ENERGY FORMULA) TABS Take by mouth daily.     No current facility-administered medications on file prior to visit.    Review of Systems Constitutional: Negative for increased diaphoresis, other activity, appetite or siginficant weight change other than noted HENT: Negative for worsening hearing loss, ear pain, facial swelling, mouth sores and neck stiffness.   Eyes: Negative for other worsening pain, redness or visual disturbance.  Respiratory: Negative for shortness of breath and wheezing  Cardiovascular: Negative for chest pain and palpitations.  Gastrointestinal: Negative for diarrhea, blood in stool, abdominal distention or other pain Genitourinary: Negative for hematuria, flank pain or change in urine volume.  Musculoskeletal: Negative for myalgias or other joint complaints.  Skin: Negative for color change and wound or drainage.  Neurological: Negative for syncope and numbness. other than noted Hematological: Negative for adenopathy. or other swelling Psychiatric/Behavioral: Negative for hallucinations, SI, self-injury, decreased concentration or other worsening agitation.      Objective:   Physical Exam BP 120/86 mmHg  Pulse 68  Temp(Src) 98 F (36.7 C)  Wt 202 lb (91.627 kg)  SpO2 96% VS noted,  Constitutional: Pt is oriented to person, place, and time. Appears well-developed and well-nourished, in no significant distress Head: Normocephalic and atraumatic.  Right Ear: External ear normal.  Left Ear: External ear normal.  Nose: Nose normal.  Mouth/Throat: Oropharynx is clear and moist.  Eyes: Conjunctivae and EOM are normal. Pupils are equal, round, and reactive to light.  Neck: Normal range of motion. Neck supple. No JVD present. No tracheal deviation present or significant neck LA or mass Cardiovascular: Normal rate, regular rhythm, normal heart sounds and  intact distal pulses.   Pulmonary/Chest: Effort normal and breath sounds without rales or wheezing  Abdominal: Soft. Bowel sounds are normal. NT. No HSM  Musculoskeletal: Normal range of motion. Exhibits no edema.  Lymphadenopathy:  Has no cervical adenopathy.  Neurological: Pt is alert and oriented to person, place, and time. Pt has normal reflexes. No cranial nerve deficit. Motor grossly intact Skin: Skin is warm and dry. No rash noted.  Psychiatric:  Has normal mood and affect. Behavior is normal.  Bilat knees with ,marked deg changes    Assessment & Plan:

## 2015-05-26 NOTE — Progress Notes (Signed)
Pre visit review using our clinic review tool, if applicable. No additional management support is needed unless otherwise documented below in the visit note. 

## 2015-05-26 NOTE — Assessment & Plan Note (Signed)

## 2015-05-26 NOTE — Assessment & Plan Note (Signed)
stable overall by history and exam, recent data reviewed with pt, and pt to continue medical treatment as before,  to f/u any worsening symptoms or concerns BP Readings from Last 3 Encounters:  05/26/15 120/86  04/22/15 124/82  03/19/15 140/74

## 2015-05-26 NOTE — Patient Instructions (Addendum)
You had the Prevnar 13 pneumonia shot today  OK to stop the prilosec  Please take all new medication as prescribed - the protonix  Please continue all other medications as before, and refills have been done if requested.  Please have the pharmacy call with any other refills you may need.  Please continue your efforts at being more active, low cholesterol diet, and weight control.  You are otherwise up to date with prevention measures today.  Please keep your appointments with your specialists as you may have planned  You will be contacted regarding the referral for: colonoscopy  Please call if you change your mind about having the Bone Density test done due to taking the steroids so much  Please go to the LAB in the Basement (turn left off the elevator) for the tests to be done today  You will be contacted by phone if any changes need to be made immediately.  Otherwise, you will receive a letter about your results with an explanation, but please check with MyChart first.  Please remember to sign up for MyChart if you have not done so, as this will be important to you in the future with finding out test results, communicating by private email, and scheduling acute appointments online when needed.  Please return in 1 year for your yearly visit, or sooner if needed, with Lab testing done 3-5 days before

## 2015-06-05 ENCOUNTER — Other Ambulatory Visit: Payer: Self-pay | Admitting: Internal Medicine

## 2015-06-21 ENCOUNTER — Telehealth: Payer: Self-pay | Admitting: Internal Medicine

## 2015-06-21 NOTE — Telephone Encounter (Signed)
Sorry, but I would not accept that these symptoms are causing his symptoms  I see no need to change the med, since the med does not cause these side effects in real life (no matter what the side effect list may say)

## 2015-06-21 NOTE — Telephone Encounter (Signed)
Pt called stated that Protonix is giving stomach pain, swelling in joints and dizzy. Pt was wondering if Dr. Jenny Reichmann can put him back on Prilosec? Please advise, pt used  [facility] on [address].

## 2015-06-22 ENCOUNTER — Other Ambulatory Visit: Payer: Self-pay | Admitting: Internal Medicine

## 2015-06-23 MED ORDER — OMEPRAZOLE 40 MG PO CPDR: 40.0000 mg | DELAYED_RELEASE_CAPSULE | Freq: Every day | ORAL | Status: DC

## 2015-06-23 NOTE — Telephone Encounter (Signed)
Protonix D/C'd per PCP, Prilosec 40 mg Rx'd.

## 2015-08-06 HISTORY — PX: BACK SURGERY: SHX140

## 2015-10-26 ENCOUNTER — Ambulatory Visit (INDEPENDENT_AMBULATORY_CARE_PROVIDER_SITE_OTHER): Payer: BLUE CROSS/BLUE SHIELD | Admitting: Internal Medicine

## 2015-10-26 ENCOUNTER — Encounter (HOSPITAL_COMMUNITY): Payer: Self-pay | Admitting: Emergency Medicine

## 2015-10-26 ENCOUNTER — Telehealth: Payer: Self-pay | Admitting: Internal Medicine

## 2015-10-26 ENCOUNTER — Emergency Department (HOSPITAL_COMMUNITY): Payer: BLUE CROSS/BLUE SHIELD

## 2015-10-26 ENCOUNTER — Emergency Department (HOSPITAL_COMMUNITY)
Admission: EM | Admit: 2015-10-26 | Discharge: 2015-10-26 | Disposition: A | Discharge status: Home / Self Care | Payer: BLUE CROSS/BLUE SHIELD | Attending: Emergency Medicine | Admitting: Emergency Medicine

## 2015-10-26 VITALS — BP 122/72 | HR 101 | Temp 98.2°F | Resp 20 | Wt 214.0 lb

## 2015-10-26 DIAGNOSIS — G4700 Insomnia, unspecified: Secondary | ICD-10-CM | POA: Insufficient documentation

## 2015-10-26 DIAGNOSIS — Z8709 Personal history of other diseases of the respiratory system: Secondary | ICD-10-CM | POA: Diagnosis not present

## 2015-10-26 DIAGNOSIS — Z86711 Personal history of pulmonary embolism: Secondary | ICD-10-CM | POA: Insufficient documentation

## 2015-10-26 DIAGNOSIS — Z791 Long term (current) use of non-steroidal anti-inflammatories (NSAID): Secondary | ICD-10-CM | POA: Diagnosis not present

## 2015-10-26 DIAGNOSIS — R200 Anesthesia of skin: Secondary | ICD-10-CM | POA: Insufficient documentation

## 2015-10-26 DIAGNOSIS — G43809 Other migraine, not intractable, without status migrainosus: Secondary | ICD-10-CM | POA: Diagnosis not present

## 2015-10-26 DIAGNOSIS — Z79899 Other long term (current) drug therapy: Secondary | ICD-10-CM | POA: Diagnosis not present

## 2015-10-26 DIAGNOSIS — M4802 Spinal stenosis, cervical region: Secondary | ICD-10-CM | POA: Diagnosis not present

## 2015-10-26 DIAGNOSIS — M1990 Unspecified osteoarthritis, unspecified site: Secondary | ICD-10-CM | POA: Diagnosis not present

## 2015-10-26 DIAGNOSIS — E785 Hyperlipidemia, unspecified: Secondary | ICD-10-CM | POA: Insufficient documentation

## 2015-10-26 DIAGNOSIS — Z8719 Personal history of other diseases of the digestive system: Secondary | ICD-10-CM | POA: Insufficient documentation

## 2015-10-26 DIAGNOSIS — M069 Rheumatoid arthritis, unspecified: Secondary | ICD-10-CM | POA: Insufficient documentation

## 2015-10-26 DIAGNOSIS — F419 Anxiety disorder, unspecified: Secondary | ICD-10-CM | POA: Diagnosis not present

## 2015-10-26 DIAGNOSIS — I2510 Atherosclerotic heart disease of native coronary artery without angina pectoris: Secondary | ICD-10-CM | POA: Insufficient documentation

## 2015-10-26 DIAGNOSIS — G43009 Migraine without aura, not intractable, without status migrainosus: Secondary | ICD-10-CM | POA: Diagnosis not present

## 2015-10-26 DIAGNOSIS — I10 Essential (primary) hypertension: Secondary | ICD-10-CM | POA: Insufficient documentation

## 2015-10-26 DIAGNOSIS — F429 Obsessive-compulsive disorder, unspecified: Secondary | ICD-10-CM | POA: Diagnosis not present

## 2015-10-26 DIAGNOSIS — Z7982 Long term (current) use of aspirin: Secondary | ICD-10-CM | POA: Insufficient documentation

## 2015-10-26 DIAGNOSIS — E669 Obesity, unspecified: Secondary | ICD-10-CM | POA: Diagnosis not present

## 2015-10-26 DIAGNOSIS — Z8711 Personal history of peptic ulcer disease: Secondary | ICD-10-CM | POA: Insufficient documentation

## 2015-10-26 DIAGNOSIS — G43909 Migraine, unspecified, not intractable, without status migrainosus: Secondary | ICD-10-CM | POA: Insufficient documentation

## 2015-10-26 DIAGNOSIS — D649 Anemia, unspecified: Secondary | ICD-10-CM | POA: Insufficient documentation

## 2015-10-26 DIAGNOSIS — R51 Headache: Secondary | ICD-10-CM

## 2015-10-26 DIAGNOSIS — R42 Dizziness and giddiness: Secondary | ICD-10-CM | POA: Diagnosis present

## 2015-10-26 DIAGNOSIS — R519 Headache, unspecified: Secondary | ICD-10-CM

## 2015-10-26 LAB — BASIC METABOLIC PANEL
ANION GAP: 9 (ref 5–15)
BUN: 14 mg/dL (ref 6–20)
CHLORIDE: 108 mmol/L (ref 101–111)
CO2: 26 mmol/L (ref 22–32)
CREATININE: 1.01 mg/dL (ref 0.61–1.24)
Calcium: 8.8 mg/dL — ABNORMAL LOW (ref 8.9–10.3)
GFR calc non Af Amer: >60 mL/min (ref >60)
Glucose, Bld: 96 mg/dL (ref 65–99)
Potassium: 4.1 mmol/L (ref 3.5–5.1)
SODIUM: 143 mmol/L (ref 135–145)

## 2015-10-26 LAB — URINALYSIS, ROUTINE W REFLEX MICROSCOPIC
Bilirubin Urine: NEGATIVE
GLUCOSE, UA: NEGATIVE mg/dL
Hgb urine dipstick: NEGATIVE
Ketones, ur: NEGATIVE mg/dL
LEUKOCYTES UA: NEGATIVE
Nitrite: NEGATIVE
PROTEIN: NEGATIVE mg/dL
Specific Gravity, Urine: 1.015 (ref 1.005–1.030)
pH: 6 (ref 5.0–8.0)

## 2015-10-26 LAB — CBC
HCT: 38.8 % — ABNORMAL LOW (ref 39.0–52.0)
Hemoglobin: 12.5 g/dL — ABNORMAL LOW (ref 13.0–17.0)
MCH: 29.8 pg (ref 26.0–34.0)
MCHC: 32.2 g/dL (ref 30.0–36.0)
MCV: 92.4 fL (ref 78.0–100.0)
PLATELETS: 267 10*3/uL (ref 150–400)
RBC: 4.2 MIL/uL — AB (ref 4.22–5.81)
RDW: 13.4 % (ref 11.5–15.5)
WBC: 8.1 10*3/uL (ref 4.0–10.5)

## 2015-10-26 LAB — CBG MONITORING, ED: Glucose-Capillary: 87 mg/dL (ref 65–99)

## 2015-10-26 MED ORDER — TRAMADOL HCL 50 MG PO TABS: 50.0000 mg | ORAL_TABLET | Freq: Four times a day (QID) | ORAL | Status: DC | PRN

## 2015-10-26 MED ORDER — METOCLOPRAMIDE HCL 5 MG/ML IJ SOLN
10.0000 mg | Freq: Once | INTRAMUSCULAR | Status: AC
  Administered 2015-10-26: 10 mg via INTRAVENOUS
  Filled 2015-10-26: qty 2

## 2015-10-26 MED ORDER — LORAZEPAM 2 MG/ML IJ SOLN
2.0000 mg | Freq: Once | INTRAMUSCULAR | Status: AC
  Administered 2015-10-26: 2 mg via INTRAVENOUS
  Filled 2015-10-26: qty 1

## 2015-10-26 MED ORDER — MORPHINE SULFATE (PF) 4 MG/ML IV SOLN
6.0000 mg | Freq: Once | INTRAVENOUS | Status: AC
  Administered 2015-10-26: 6 mg via INTRAVENOUS
  Filled 2015-10-26: qty 2

## 2015-10-26 NOTE — Telephone Encounter (Signed)
Patient Name: TERRYN BEGAY DOB: [birth year] Initial Comment caller is having headaches, dizziness and has numbness in the fingers (one hand) - Also cannot walk straight Nurse Assessment Nurse: Vallery Sa, RN, T[place] Date/Time (Eastern Time): 10/26/2015 8:24:56 AM Confirm and document reason for call. If symptomatic, describe symptoms. You must click the next button to save text entered. ---caller states he developed dizziness, headache and numbness in his left hand/arm several weeks ago that is worse today. No severe breathing or swallowing difficulty. Alert and responsive. No injury in the past 3 days. Has the patient traveled out of the country within the last 30 days? ---No Does the patient have any new or worsening symptoms? ---Yes Will a triage be completed? ---Yes Related visit to physician within the last 2 weeks? ---No Does the PT have any chronic conditions? (i.e. diabetes, asthma, etc.) ---Yes List chronic conditions. ---RA, Fibromyalgia, Fast heart rates, high Blood Pressure and Cholesterol Is this a behavioral health or substance abuse call? ---No Guidelines Guideline Title Affirmed Question Affirmed Notes Neurologic Deficit [1] Numbness (i.e., loss of sensation) of the face, arm / hand, or leg / foot on one side of the body AND [2] sudden onset AND [3] present now Final Disposition User Call EMS 911 Now Vallery Sa, RN, [place] Disagree/Comply: ComplySteven states his wife is taking him to [place] now.

## 2015-10-26 NOTE — ED Notes (Addendum)
Dizziness and slurred speech with difficulty balancing x 4 days. Left arm numbness since last night. Also c/o migraine on-going with these symptoms as well. Hx of blood clot to lung, not on blood thinners currently. Denies fall/trauma to head recently. Denies SOB, CP. States he has felt more fatigued recently. No other focal neuro symptoms/major deficits observed in brief neuro assessment. Passed finger-nose-finger test, but states it made him very dizzy to perform.

## 2015-10-26 NOTE — Assessment & Plan Note (Signed)
Recurrent, for pain control, also requests neurology referral,  to f/u any worsening symptoms or concerns

## 2015-10-26 NOTE — Assessment & Plan Note (Signed)
stable overall by history and exam, recent data reviewed with pt, and pt to continue medical treatment as before,  to f/u any worsening symptoms or concerns BP Readings from Last 3 Encounters:  10/26/15 122/72  10/26/15 125/85  05/26/15 120/86

## 2015-10-26 NOTE — ED Provider Notes (Signed)
CSN: [id number]     Arrival date & time 10/26/15  [id number] History   First MD Initiated Contact with Patient 10/26/15 1204     Chief Complaint  Patient presents with  . Numbness  . Dizziness      HPI Patient presents to the emergency Department with complaints of dizziness and feeling slightly off balance several days with associated headache. He does have a history of migraine headaches but reports no significant headaches in some time. His headache over the past 2 weeks. No history of cancer. He also reports numbness to his 5 fingers of his left hand. He also reports some increasing neck pain. He denies weakness of his arms or legs. No prior history of stroke. He does have a history of coronary artery disease. Currently reports his headache is moderate in severity. No photophobia.   Past Medical History  Diagnosis Date  . ALLERGIC RHINITIS 04/30/2007  . ANEMIA-NOS 07/12/2007  . ANXIETY 02/17/2009  . 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 DEPRESSIVE DISORDER 02/14/2009  . HYPERLIPIDEMIA 04/27/2007  . HYPERTENSION 04/27/2007  . INSOMNIA UNSPECIFIED 02/20/2010  . 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 Nonspecific abnormal toxicological findings 03/14/2008  . OBESITY 04/30/2007  . OSTEOARTHRITIS 04/30/2007  . 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 History of pulmonary embolism 03/09/2008  . PROSTATITIS, HX OF 04/30/2007  . RESTRICTIVE LUNG DISEASE 06/20/2009  . THORACIC AORTIC ANEURYSM 07/06/2009    Chest CT 11/12: ascending thoracic aortic aneurysm stable at 42 mm.   Marland Kitchen UNSPECIFIED MYALGIA AND MYOSITIS 02/14/2009  . CAD (coronary artery disease)     LHC 11/09 proximal RCA 30%, mid RCA 30%, distal RCA with serial 30-40%, EF 40-45%, normal right heart pressures.  Echo 11/10 mild LVH, EF A999333, grade 1 diastolic dysfunction, aortic root moderately dilated, mild LAE.;  Myoview 11/10: Small lateral scar, no ischemia, EF 61%.     Past Surgical History  Procedure Laterality Date  . Appendectomy     Family History   Problem Relation Age of Onset  . Alcohol abuse Other   . Diabetes Other   . Hypertension Other   . Heart disease Other   . Stroke Other   . Diabetes Mother   . Heart disease Mother   . Hyperlipidemia Mother   . Stroke Mother   . Diabetes Father   . Heart disease Father   . Hyperlipidemia Father   . Stroke Father   . Hypertension Sister   . Diabetes Brother   . Diabetes Brother   . Heart disease Brother   . Hypertension Brother    Social History  Substance Use Topics  . Smoking status: Never Smoker   . Smokeless tobacco: None  . Alcohol Use: No    Review of Systems  All other systems reviewed and are negative.     Allergies  Atorvastatin; Ciprofloxacin; Crestor; Doxycycline; and Hydroxychloroquine sulfate  Home Medications   Prior to Admission medications   Medication Sig Start Date End Date Taking? Authorizing Provider  aspirin 81 MG tablet Take 81 mg by mouth daily.     Yes Historical Provider, MD  celecoxib (CELEBREX) 200 MG capsule take 1 capsule by mouth twice a day if needed Patient taking differently: Take 200 mg by mouth 2 (two) times daily.  05/26/15  Yes Biagio Borg, MD  cetirizine (ZYRTEC) 10 MG tablet Take 10 mg by mouth daily as needed for allergies.   Yes Historical Provider, MD  Chlorpheniramine-DM (CORICIDIN COUGH/COLD) 4-30 MG TABS Take 1 tablet by mouth  daily as needed (cold symptoms).   Yes Historical Provider, MD  Cholecalciferol (VITAMIN D) 2000 UNITS CAPS Take by mouth daily.   Yes Historical Provider, MD  clonazePAM (KLONOPIN) 1 MG tablet take 1 tablet by mouth twice a day if needed for anxiety Patient taking differently: Take 1 mg by mouth at bedtime.  05/26/15  Yes Biagio Borg, MD  folic acid (FOLVITE) Q000111Q MCG tablet Take by mouth 2 (two) times daily.    Yes Historical Provider, MD  Glucos-Chond-Hyal Ac-Ca Fructo (MOVE FREE JOINT HEALTH ADVANCE) TABS Take 1 tablet by mouth daily.   Yes Historical Provider, MD  hydroxychloroquine  (PLAQUENIL) 200 MG tablet Take 200 mg by mouth 2 (two) times daily.    Yes Historical Provider, MD  ibuprofen (ADVIL,MOTRIN) 200 MG tablet Take 400-600 mg by mouth every 6 (six) hours as needed for headache or moderate pain.   Yes Historical Provider, MD  leflunomide (ARAVA) 20 MG tablet Take 20 mg by mouth daily.     Yes Historical Provider, MD  loperamide (IMODIUM A-D) 2 MG tablet Take 2 mg by mouth as needed for diarrhea or loose stools.    Yes Historical Provider, MD  losartan (COZAAR) 25 MG tablet Take 1 tablet (25 mg total) by mouth 2 (two) times daily. Patient taking differently: Take 25 mg by mouth daily.  05/26/15  Yes Biagio Borg, MD  metoprolol tartrate (LOPRESSOR) 25 MG tablet Take 1 tablet (25 mg total) by mouth 2 (two) times daily. 05/26/15  Yes Biagio Borg, MD  Omega-3 Fatty Acids (FISH OIL) 1200 MG CAPS Take by mouth daily.     Yes Historical Provider, MD  omeprazole (PRILOSEC) 20 MG capsule Take 20 mg by mouth 2 (two) times daily.   Yes Historical Provider, MD  pseudoephedrine-guaifenesin (MUCINEX D) 60-600 MG per tablet Take 1 tablet by mouth every 12 (twelve) hours. Patient taking differently: Take 1 tablet by mouth daily as needed for congestion.  03/19/15 03/18/16 Yes Roselee Culver, MD  Specialty Vitamins Products (ONE-A-DAY ENERGY FORMULA) TABS Take by mouth daily.   Yes Historical Provider, MD  tamsulosin (FLOMAX) 0.4 MG CAPS capsule Take 1 capsule (0.4 mg total) by mouth daily. 05/26/15  Yes Biagio Borg, MD  zolpidem (AMBIEN CR) 12.5 MG CR tablet take 1 tablet by mouth at bedtime if needed for sleep 05/26/15  Yes Biagio Borg, MD  ibuprofen (ADVIL,MOTRIN) 600 MG tablet take 1 tablet by mouth every 8 hours if needed pain 08/29/13   Biagio Borg, MD  omeprazole (PRILOSEC) 40 MG capsule Take 1 capsule (40 mg total) by mouth daily. 06/23/15   Biagio Borg, MD   BP 142/66 mmHg  Pulse 74  Temp(Src) 98.2 F (36.8 C) (Oral)  Resp 18  SpO2 97% Physical Exam   Constitutional: He is oriented to person, place, and time. He appears well-developed and well-nourished.  HENT:  Head: Normocephalic and atraumatic.  Eyes: EOM are normal. Pupils are equal, round, and reactive to light.  Neck: Normal range of motion.  Cardiovascular: Normal rate, regular rhythm, normal heart sounds and intact distal pulses.   Pulmonary/Chest: Effort normal and breath sounds normal. No respiratory distress.  Abdominal: Soft. He exhibits no distension. There is no tenderness.  Musculoskeletal: Normal range of motion.  Neurological: He is alert and oriented to person, place, and time.  5/5 strength in major muscle groups of  bilateral upper and lower extremities. Speech normal. No facial asymetry.   Skin: Skin  is warm and dry.  Psychiatric: He has a normal mood and affect. Judgment normal.  Nursing note and vitals reviewed.   ED Course  Procedures (including critical care time) Labs Review Labs Reviewed  BASIC METABOLIC PANEL - Abnormal; Notable for the following:    Calcium 8.8 (*)    All other components within normal limits  CBC - Abnormal; Notable for the following:    RBC 4.20 (*)    Hemoglobin 12.5 (*)    HCT 38.8 (*)    All other components within normal limits  URINALYSIS, ROUTINE W REFLEX MICROSCOPIC (NOT AT [facility])  CBG MONITORING, ED    Imaging Review Mr Brain Wo Contrast  10/26/2015  CLINICAL DATA:  Left hand numbness. Headache. Dizziness and slurred speech EXAM: MRI HEAD WITHOUT CONTRAST TECHNIQUE: Multiplanar, multiecho pulse sequences of the brain and surrounding structures were obtained without intravenous contrast. COMPARISON:  None. FINDINGS: Negative for acute infarct. Mild chronic microvascular ischemic change in the white matter Ventricle size normal.  Mild atrophy. Negative for intracranial hemorrhage. Negative for mass or edema. No shift of the midline structures. Mild mucosal edema paranasal sinuses. Normal pituitary. Skull base normal. No  orbital mass lesion. IMPRESSION: Atrophy and mild chronic microvascular ischemia. No acute intracranial abnormality. Electronically Signed   By: Franchot Gallo M.D.   On: 10/26/2015 14:50   Mr Cervical Spine Wo Contrast  10/26/2015  CLINICAL DATA:  Left hand and arm numbness. Dizziness and slurred speech EXAM: MRI CERVICAL SPINE WITHOUT CONTRAST TECHNIQUE: Multiplanar, multisequence MR imaging of the cervical spine was performed. No intravenous contrast was administered. COMPARISON:  None. FINDINGS: Image quality degraded by mild to moderate motion. Normal cervical alignment. Negative for fracture or mass. Hemangioma T3 vertebral body. Spinal cord signal normal.  Cervical medullary junction normal. C2-3:  Negative C3-4: Moderate to large central disc protrusion with cord flattening and moderate spinal stenosis. Neural foramina patent C4-5: Small central disc protrusion. Disc degeneration and spondylosis with bilateral uncinate spurring. Mild spinal and mild foraminal stenosis bilaterally. C5-6: Disc degeneration and spondylosis. Diffuse uncinate spurring right of greater than left. Moderate right foraminal encroachment and mild left foraminal encroachment. Mild spinal stenosis. C6-7: Disc degeneration and spondylosis with diffuse uncinate spurring. Mild spinal stenosis and mild foraminal stenosis bilaterally C7-T1:  Negative IMPRESSION: Moderate to large central disc protrusion at C3-4 with cord flattening and moderate spinal stenosis. Mild spinal and foraminal stenosis at C4-5 Mild spinal stenosis and moderate right foraminal stenosis at C5-6. Mild spinal and mild foraminal stenosis bilaterally C6-7. Electronically Signed   By: Franchot Gallo M.D.   On: 10/26/2015 14:59   I have personally reviewed and evaluated these images and lab results as part of my medical decision-making.   EKG Interpretation   Date/Time:  Thursday October 26 2015 09:55:04 EDT Ventricular Rate:  76 PR Interval:  160 QRS Duration:  102 QT Interval:  393 QTC Calculation: 442 R Axis:   75 Text Interpretation:  Sinus rhythm Consider RVH or posterior infarct  Baseline wander in lead(s) V3 No significant change was found Confirmed by  Lorraine Terriquez  MD, Lennette Bihari ([id number]) on 10/26/2015 12:32:16 PM      MDM   Final diagnoses:  None    MR brain without acute abnormality. MRI cervical spine does demonstrate moderate to large central disc protrusion at C3-C4 with some cord flattening and moderate spinal stenosis. He has been having some trapezius pain but this does not explain his numbness in his fingers of his left hand. He  will be given outpatient neurosurgical follow-up as well as outpatient neurology follow-up. After treatment of his headache with typical migraine medications he reports that his headache is improving and his left hand is improving. His murmur present, clear migraine. Outpatient primary care, neurology, neurosurgical follow-up. Patient extends to return to the ER for new or worse symptoms. No indication for additional management of treatment in the ER today    Jola Schmidt, MD 10/26/15 1541

## 2015-10-26 NOTE — Progress Notes (Signed)
Subjective:    Patient ID: [name] Michael, male    DOB: Aug 10, 1950, 65 y.o.   MRN: [id number]  HPI  Here to f/u, seen at ED earlier today with head and neck pain, brain/cspine MRI c/w spinal stenosis, directed here for further disposition.  Pt with persistent HA and neck pain, Pt denies new neurological symptoms such as new headache, or facial or extremity weakness.  Pt denies chest pain, increased sob or doe, wheezing, orthopnea, PND, increased LE swelling, palpitations, dizziness or syncope.   Pt denies polydipsia, polyuria Past Medical History  Diagnosis Date  . ALLERGIC RHINITIS 04/30/2007  . ANEMIA-NOS 07/12/2007  . ANXIETY 02/17/2009  . 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 DEPRESSIVE DISORDER 02/14/2009  . HYPERLIPIDEMIA 04/27/2007  . HYPERTENSION 04/27/2007  . INSOMNIA UNSPECIFIED 02/20/2010  . 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 Nonspecific abnormal toxicological findings 03/14/2008  . OBESITY 04/30/2007  . OSTEOARTHRITIS 04/30/2007  . 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 History of pulmonary embolism 03/09/2008  . PROSTATITIS, HX OF 04/30/2007  . RESTRICTIVE LUNG DISEASE 06/20/2009  . THORACIC AORTIC ANEURYSM 07/06/2009    Chest CT 11/12: ascending thoracic aortic aneurysm stable at 42 mm.   Marland Kitchen UNSPECIFIED MYALGIA AND MYOSITIS 02/14/2009  . CAD (coronary artery disease)     LHC 11/09 proximal RCA 30%, mid RCA 30%, distal RCA with serial 30-40%, EF 40-45%, normal right heart pressures.  Echo 11/10 mild LVH, EF A999333, grade 1 diastolic dysfunction, aortic root moderately dilated, mild LAE.;  Myoview 11/10: Small lateral scar, no ischemia, EF 61%.     Past Surgical History  Procedure Laterality Date  . Appendectomy      reports that he has never smoked. He does not have any smokeless tobacco history on file. He reports that he does not drink alcohol or use illicit drugs. family history includes Alcohol abuse in his other; Diabetes in his brother, brother, father, mother, and other; Heart  disease in his brother, father, mother, and other; Hyperlipidemia in his father and mother; Hypertension in his brother, other, and sister; Stroke in his father, mother, and other. Allergies  Allergen Reactions  . Atorvastatin     REACTION: myalgias  . Ciprofloxacin Other (See Comments)    myalgias  . Crestor [Rosuvastatin]     myalgias  . Doxycycline   . Hydroxychloroquine Sulfate     REACTION: GI upset and H, pt is currently taking and still has some GI problems but is tolerating it   Current Outpatient Prescriptions on File Prior to Visit  Medication Sig Dispense Refill  . aspirin 81 MG tablet Take 81 mg by mouth daily.      . celecoxib (CELEBREX) 200 MG capsule take 1 capsule by mouth twice a day if needed (Patient taking differently: Take 200 mg by mouth 2 (two) times daily. ) 60 capsule 11  . Cholecalciferol (VITAMIN D) 2000 UNITS CAPS Take by mouth daily.    . clonazePAM (KLONOPIN) 1 MG tablet take 1 tablet by mouth twice a day if needed for anxiety (Patient taking differently: Take 1 mg by mouth at bedtime. ) 60 tablet 5  . folic acid (FOLVITE) Q000111Q MCG tablet Take by mouth 2 (two) times daily.     . hydroxychloroquine (PLAQUENIL) 200 MG tablet Take 200 mg by mouth 2 (two) times daily.     Marland Kitchen ibuprofen (ADVIL,MOTRIN) 600 MG tablet take 1 tablet by mouth every 8 hours if needed pain 90 tablet 1  . leflunomide (ARAVA) 20 MG tablet Take 20  mg by mouth daily.      Marland Kitchen loperamide (IMODIUM A-D) 2 MG tablet Take 2 mg by mouth as needed for diarrhea or loose stools.     Marland Kitchen losartan (COZAAR) 25 MG tablet Take 1 tablet (25 mg total) by mouth 2 (two) times daily. (Patient taking differently: Take 25 mg by mouth daily. ) 180 tablet 3  . metoprolol tartrate (LOPRESSOR) 25 MG tablet Take 1 tablet (25 mg total) by mouth 2 (two) times daily. 180 tablet 3  . Omega-3 Fatty Acids (FISH OIL) 1200 MG CAPS Take by mouth daily.      Marland Kitchen omeprazole (PRILOSEC) 40 MG capsule Take 1 capsule (40 mg total) by mouth  daily. 90 capsule 1  . pseudoephedrine-guaifenesin (MUCINEX D) 60-600 MG per tablet Take 1 tablet by mouth every 12 (twelve) hours. (Patient taking differently: Take 1 tablet by mouth daily as needed for congestion. ) 18 tablet 0  . Specialty Vitamins Products (ONE-A-DAY ENERGY FORMULA) TABS Take by mouth daily.    . tamsulosin (FLOMAX) 0.4 MG CAPS capsule Take 1 capsule (0.4 mg total) by mouth daily. 90 capsule 3  . zolpidem (AMBIEN CR) 12.5 MG CR tablet take 1 tablet by mouth at bedtime if needed for sleep 90 tablet 1   No current facility-administered medications on file prior to visit.   Review of Systems  Constitutional: Negative for unusual diaphoresis or night sweats HENT: Negative for ringing in ear or discharge Eyes: Negative for double vision or worsening visual disturbance.  Respiratory: Negative for choking and stridor.   Gastrointestinal: Negative for vomiting or other signifcant bowel change Genitourinary: Negative for hematuria or change in urine volume.  Musculoskeletal: Negative for other MSK pain or swelling Skin: Negative for color change and worsening wound.  Neurological: Negative for tremors and numbness other than noted  Psychiatric/Behavioral: Negative for decreased concentration or agitation other than above       Objective:   Physical Exam BP 122/72 mmHg  Pulse 101  Temp(Src) 98.2 F (36.8 C) (Oral)  Resp 20  Wt 214 lb (97.07 kg)  SpO2 94% VS noted,  Constitutional: Pt appears in no significant distress HENT: Head: NCAT.  Right Ear: External ear normal.  Left Ear: External ear normal.  Eyes: . Pupils are equal, round, and reactive to light. Conjunctivae and EOM are normal Neck: Normal range of motion. Neck supple.  Cardiovascular: Normal rate and regular rhythm.   Pulmonary/Chest: Effort normal and breath sounds without rales or wheezing.  Abd:  Soft, NT, ND, + BS Neurological: Pt is alert. Not confused , motor grossly intact Spine mild mid cervical  midline tender, no swelling or rash Skin: Skin is warm. No rash, no LE edema Psychiatric: Pt behavior is normal. No agitation.     Assessment & Plan:

## 2015-10-26 NOTE — ED Notes (Signed)
Gave EKG to campos MD, notified him of pt condition and recommended head CT

## 2015-10-26 NOTE — Progress Notes (Signed)
Pre visit review using our clinic review tool, if applicable. No additional management support is needed unless otherwise documented below in the visit note. 

## 2015-10-26 NOTE — Patient Instructions (Signed)
Please take all new medication as prescribed - the pain medication  You will be contacted regarding the referral for: Neurosurgury, and Neurology  Please continue all other medications as before, and refills have been done if requested.  Please have the pharmacy call with any other refills you may need.  Please keep your appointments with your specialists as you may have planned

## 2015-10-26 NOTE — Assessment & Plan Note (Signed)
MRI c/w spinal stenosis with some cord flattening, for pain control, refer NS urgent,  to f/u any worsening symptoms or concerns

## 2015-10-30 DIAGNOSIS — M4712 Other spondylosis with myelopathy, cervical region: Secondary | ICD-10-CM | POA: Insufficient documentation

## 2015-11-07 ENCOUNTER — Telehealth: Payer: Self-pay

## 2015-11-07 MED ORDER — ZOLPIDEM TARTRATE ER 12.5 MG PO TBCR: EXTENDED_RELEASE_TABLET | ORAL | Status: DC

## 2015-11-07 NOTE — Telephone Encounter (Signed)
rf rq for zolpidem 12.5 mg to [facility]

## 2015-11-07 NOTE — Telephone Encounter (Signed)
Done hardcopy to Corinne  

## 2015-11-08 NOTE — Telephone Encounter (Signed)
Medication has been sent to pharmacy patient is aware 

## 2015-12-04 ENCOUNTER — Ambulatory Visit: Payer: BLUE CROSS/BLUE SHIELD | Admitting: Neurology

## 2015-12-19 ENCOUNTER — Telehealth: Payer: Self-pay

## 2015-12-19 MED ORDER — CLONAZEPAM 1 MG PO TABS: ORAL_TABLET | ORAL | Status: DC

## 2015-12-19 NOTE — Addendum Note (Signed)
Addended by: Biagio Borg on: 12/19/2015 06:39 PM   Modules accepted: Orders

## 2015-12-19 NOTE — Telephone Encounter (Signed)
Please advise patient is requesting refill on clonazepam 

## 2015-12-19 NOTE — Telephone Encounter (Signed)
Done hardcopy to Corinne  

## 2015-12-20 NOTE — Telephone Encounter (Signed)
Medication faxed to pharmacy 

## 2015-12-25 ENCOUNTER — Ambulatory Visit (INDEPENDENT_AMBULATORY_CARE_PROVIDER_SITE_OTHER): Payer: BLUE CROSS/BLUE SHIELD | Admitting: Neurology

## 2015-12-25 ENCOUNTER — Encounter: Payer: Self-pay | Admitting: Neurology

## 2015-12-25 VITALS — BP 130/84 | HR 82 | Ht 71.0 in | Wt 208.4 lb

## 2015-12-25 DIAGNOSIS — M4802 Spinal stenosis, cervical region: Secondary | ICD-10-CM | POA: Diagnosis not present

## 2015-12-25 DIAGNOSIS — G43809 Other migraine, not intractable, without status migrainosus: Secondary | ICD-10-CM

## 2015-12-25 DIAGNOSIS — G43109 Migraine with aura, not intractable, without status migrainosus: Secondary | ICD-10-CM | POA: Diagnosis not present

## 2015-12-25 NOTE — Progress Notes (Signed)
NEUROLOGY CONSULTATION NOTE  Mondo Arakaki MRN: [id number] DOB: 09-29-1950  Referring provider: Dr. Jenny Reichmann Primary care provider: Dr. Jenny Reichmann  Reason for consult:  headache  HISTORY OF PRESENT ILLNESS: [name] Michael is a 65 year old right-handed man with hypertension, hyperlipidemia, PUD, thoracic aortic aneurysm, CAD, depression, and RA who presents for migraine and cervical stenosis.  History obtained by patient, ED note and PCP note.  Labs and imaging of brain and cervical MRIs personally reviewed.  In March, he developed bi-frontal pounding headaches associated with spinning and unsteadiness.  He also noted left sided neck pain with numbness down the left arm and into the fingers.  He went to the ED on 10/26/15 for evaluation.  MRI of brain from 10/26/15 showed mild chronic small vessel disease but no acute intracranial abnormality.  MRI of cervical spine demonstrated moderate to large central disc protrusion at C3-4 with cord flattening and moderate spinal stenosis.  He was diagnosed with migraine.  He underwent spinal surgery on 11/15/15.  He has not had any recurrent migraines.  He has a dull headache about once a week.  Tramadol seems to help.  He does have remote history of migraines.  He is a Administrator.  BMP from March was unremarkable.  PAST MEDICAL HISTORY: Past Medical History  Diagnosis Date  . ALLERGIC RHINITIS 04/30/2007  . ANEMIA-NOS 07/12/2007  . ANXIETY 02/17/2009  . 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 DEPRESSIVE DISORDER 02/14/2009  . HYPERLIPIDEMIA 04/27/2007  . HYPERTENSION 04/27/2007  . INSOMNIA UNSPECIFIED 02/20/2010  . 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 Nonspecific abnormal toxicological findings 03/14/2008  . OBESITY 04/30/2007  . OSTEOARTHRITIS 04/30/2007  . 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 History of pulmonary embolism 03/09/2008  . PROSTATITIS, HX OF 04/30/2007  . RESTRICTIVE LUNG DISEASE 06/20/2009  . THORACIC AORTIC ANEURYSM 07/06/2009    Chest CT 11/12:  ascending thoracic aortic aneurysm stable at 42 mm.   Marland Kitchen UNSPECIFIED MYALGIA AND MYOSITIS 02/14/2009  . CAD (coronary artery disease)     LHC 11/09 proximal RCA 30%, mid RCA 30%, distal RCA with serial 30-40%, EF 40-45%, normal right heart pressures.  Echo 11/10 mild LVH, EF A999333, grade 1 diastolic dysfunction, aortic root moderately dilated, mild LAE.;  Myoview 11/10: Small lateral scar, no ischemia, EF 61%.      PAST SURGICAL HISTORY: Past Surgical History  Procedure Laterality Date  . Appendectomy      MEDICATIONS: Current Outpatient Prescriptions on File Prior to Visit  Medication Sig Dispense Refill  . Cholecalciferol (VITAMIN D) 2000 UNITS CAPS Take by mouth daily. Reported on 12/25/2015    . clonazePAM (KLONOPIN) 1 MG tablet take 1 tablet by mouth twice a day if needed for anxiety 60 tablet 5  . folic acid (FOLVITE) Q000111Q MCG tablet Take by mouth 2 (two) times daily.     . Glucos-Chond-Hyal Ac-Ca Fructo (MOVE FREE JOINT HEALTH ADVANCE) TABS Take 1 tablet by mouth daily. Reported on 12/25/2015    . hydroxychloroquine (PLAQUENIL) 200 MG tablet Take 200 mg by mouth 2 (two) times daily.     Marland Kitchen leflunomide (ARAVA) 20 MG tablet Take 20 mg by mouth daily.      Marland Kitchen losartan (COZAAR) 25 MG tablet Take 1 tablet (25 mg total) by mouth 2 (two) times daily. (Patient taking differently: Take 25 mg by mouth daily. ) 180 tablet 3  . metoprolol tartrate (LOPRESSOR) 25 MG tablet Take 1 tablet (25 mg total) by mouth 2 (two) times daily. 180 tablet 3  .  Omega-3 Fatty Acids (FISH OIL) 1200 MG CAPS Take by mouth daily.      Marland Kitchen omeprazole (PRILOSEC) 20 MG capsule Take 20 mg by mouth 2 (two) times daily.    . tamsulosin (FLOMAX) 0.4 MG CAPS capsule Take 1 capsule (0.4 mg total) by mouth daily. 90 capsule 3  . zolpidem (AMBIEN CR) 12.5 MG CR tablet take 1 tablet by mouth at bedtime if needed for sleep 90 tablet 1  . aspirin 81 MG tablet Take 81 mg by mouth daily. Reported on 12/25/2015    . celecoxib (CELEBREX) 200  MG capsule take 1 capsule by mouth twice a day if needed (Patient not taking: Reported on 12/25/2015) 60 capsule 11  . cetirizine (ZYRTEC) 10 MG tablet Take 10 mg by mouth daily as needed for allergies. Reported on 12/25/2015    . Chlorpheniramine-DM (CORICIDIN COUGH/COLD) 4-30 MG TABS Take 1 tablet by mouth daily as needed (cold symptoms). Reported on 12/25/2015    . ibuprofen (ADVIL,MOTRIN) 200 MG tablet Take 400-600 mg by mouth every 6 (six) hours as needed for headache or moderate pain. Reported on 12/25/2015    . ibuprofen (ADVIL,MOTRIN) 600 MG tablet take 1 tablet by mouth every 8 hours if needed pain (Patient not taking: Reported on 12/25/2015) 90 tablet 1  . loperamide (IMODIUM A-D) 2 MG tablet Take 2 mg by mouth as needed for diarrhea or loose stools. Reported on 12/25/2015    . pseudoephedrine-guaifenesin (MUCINEX D) 60-600 MG per tablet Take 1 tablet by mouth every 12 (twelve) hours. (Patient not taking: Reported on 12/25/2015) 18 tablet 0  . Specialty Vitamins Products (ONE-A-DAY ENERGY FORMULA) TABS Take by mouth daily. Reported on 12/25/2015    . traMADol (ULTRAM) 50 MG tablet Take 1 tablet (50 mg total) by mouth every 6 (six) hours as needed. (Patient not taking: Reported on 12/25/2015) 100 tablet 1   No current facility-administered medications on file prior to visit.    ALLERGIES: Allergies  Allergen Reactions  . Atorvastatin     REACTION: myalgias  . Ciprofloxacin Other (See Comments)    myalgias  . Crestor [Rosuvastatin]     myalgias  . Doxycycline   . Hydroxychloroquine Sulfate     REACTION: GI upset and H, pt is currently taking and still has some GI problems but is tolerating it    FAMILY HISTORY: Family History  Problem Relation Age of Onset  . Alcohol abuse Other   . Diabetes Other   . Hypertension Other   . Heart disease Other   . Stroke Other   . Diabetes Mother   . Heart disease Mother   . Hyperlipidemia Mother   . Stroke Mother   . Diabetes Father   . Heart  disease Father   . Hyperlipidemia Father   . Stroke Father   . Hypertension Sister   . Diabetes Brother   . Diabetes Brother   . Heart disease Brother   . Hypertension Brother     SOCIAL HISTORY: Social History   Social History  . Marital Status: Married    Spouse Name: N/A  . Number of Children: N/A  . Years of Education: N/A   Occupational History  . Not on file.   Social History Main Topics  . Smoking status: Never Smoker   . Smokeless tobacco: Not on file  . Alcohol Use: No  . Drug Use: No  . Sexual Activity: Not on file   Other Topics Concern  . Not on file  Social History Narrative    REVIEW OF SYSTEMS: Constitutional: No fevers, chills, or sweats, no generalized fatigue, change in appetite Eyes: No visual changes, double vision, eye pain Ear, nose and throat: No hearing loss, ear pain, nasal congestion, sore throat Cardiovascular: No chest pain, palpitations Respiratory:  No shortness of breath at rest or with exertion, wheezes GastrointestinaI: No nausea, vomiting, diarrhea, abdominal pain, fecal incontinence Genitourinary:  No dysuria, urinary retention or frequency Musculoskeletal:  Mild neck pain Integumentary: No rash, pruritus, skin lesions Neurological: as above Psychiatric: No depression, insomnia, anxiety Endocrine: No palpitations, fatigue, diaphoresis, mood swings, change in appetite, change in weight, increased thirst Hematologic/Lymphatic:  No purpura, petechiae. Allergic/Immunologic: no itchy/runny eyes, nasal congestion, recent allergic reactions, rashes  PHYSICAL EXAM: Filed Vitals:   12/25/15 0742  BP: 130/84  Pulse: 82   General: No acute distress.  Patient appears well-groomed.  Head:  Normocephalic/atraumatic Eyes:  fundi examined but not visualized Neck: supple, mild paraspinal tenderness, full range of motion Back: No paraspinal tenderness Heart: regular rate and rhythm Lungs: Clear to auscultation bilaterally. Vascular: No  carotid bruits. Neurological Exam: Mental status: alert and oriented to person, place, and time, recent and remote memory intact, fund of knowledge intact, attention and concentration intact, speech fluent and not dysarthric, language intact. Cranial nerves: CN I: not tested CN II: pupils equal, round and reactive to light, visual fields intact CN III, IV, VI:  full range of motion, no nystagmus, no ptosis CN V: facial sensation intact CN VII: upper and lower face symmetric CN VIII: hearing intact CN IX, X: gag intact, uvula midline CN XI: sternocleidomastoid and trapezius muscles intact CN XII: tongue midline Bulk & Tone: normal, no fasciculations. Motor:  5/5 throughout  Sensation: temperature and vibration sensation intact. Deep Tendon Reflexes:  2+ throughout, toes downgoing.  Finger to nose testing:  Without dysmetria.  Heel to shin:  Without dysmetria.  Gait:  Normal station and stride.  Able to turn and tandem walk. Romberg negative.  IMPRESSION: Vestibular migraine Cervical stenosis.  The migraine may have been triggered by the cervical stenosis, or it could have been an incidental isolated event.  The fact that he hasn't had a recurrent migraine suggests that the cervical stenosis may have been playing a role.  PLAN: 1.  Since he hasn't had recurrent migraine and doesn't have chronic headache, I would not start a preventative medication 2.  He may continue acetaminophen-caffeine pills or tramadol as per PCP for treatment of headache (already on NSAIDs for RA), but limit to no more than 2 days out of the week to prevent rebound headache. 3.  Follow up as needed.  Thank you for allowing me to take part in the care of this patient.  Metta Clines, DO  CC:  Cathlean Cower, MD

## 2015-12-25 NOTE — Patient Instructions (Signed)
The disc in the neck could have caused the migraine.  Or it could simply have been an isolated incident.  I wouldn't start any daily medication for it.  You may take tramadol or acetaminophen-caffeine pills for the headache, but limit to no more than 2 days out of the week in order to prevent rebound headache.  Follow up as needed.

## 2016-01-05 ENCOUNTER — Other Ambulatory Visit: Payer: Self-pay | Admitting: *Deleted

## 2016-01-05 MED ORDER — OMEPRAZOLE 20 MG PO CPDR: 20.0000 mg | DELAYED_RELEASE_CAPSULE | Freq: Two times a day (BID) | ORAL | Status: DC

## 2016-03-08 ENCOUNTER — Other Ambulatory Visit: Payer: Self-pay | Admitting: Internal Medicine

## 2016-05-19 ENCOUNTER — Other Ambulatory Visit: Payer: Self-pay | Admitting: Internal Medicine

## 2016-05-28 ENCOUNTER — Ambulatory Visit (INDEPENDENT_AMBULATORY_CARE_PROVIDER_SITE_OTHER): Payer: BLUE CROSS/BLUE SHIELD | Admitting: Internal Medicine

## 2016-05-28 ENCOUNTER — Other Ambulatory Visit (INDEPENDENT_AMBULATORY_CARE_PROVIDER_SITE_OTHER): Payer: BLUE CROSS/BLUE SHIELD

## 2016-05-28 ENCOUNTER — Other Ambulatory Visit: Payer: Self-pay | Admitting: Internal Medicine

## 2016-05-28 ENCOUNTER — Encounter: Payer: Self-pay | Admitting: Internal Medicine

## 2016-05-28 VITALS — BP 132/72 | HR 72 | Temp 98.0°F | Resp 20 | Wt 203.1 lb

## 2016-05-28 DIAGNOSIS — Z0001 Encounter for general adult medical examination with abnormal findings: Secondary | ICD-10-CM

## 2016-05-28 DIAGNOSIS — Z86010 Personal history of colonic polyps: Secondary | ICD-10-CM | POA: Diagnosis not present

## 2016-05-28 DIAGNOSIS — N410 Acute prostatitis: Secondary | ICD-10-CM

## 2016-05-28 DIAGNOSIS — Z1159 Encounter for screening for other viral diseases: Secondary | ICD-10-CM | POA: Diagnosis not present

## 2016-05-28 DIAGNOSIS — E785 Hyperlipidemia, unspecified: Secondary | ICD-10-CM | POA: Diagnosis not present

## 2016-05-28 DIAGNOSIS — I10 Essential (primary) hypertension: Secondary | ICD-10-CM

## 2016-05-28 LAB — LIPID PANEL
CHOLESTEROL: 219 mg/dL — AB (ref 0–200)
HDL: 42.9 mg/dL (ref >39.00)
LDL Cholesterol: 149 mg/dL — ABNORMAL HIGH (ref 0–99)
NONHDL: 176.06
Total CHOL/HDL Ratio: 5
Triglycerides: 136 mg/dL (ref 0.0–149.0)
VLDL: 27.2 mg/dL (ref 0.0–40.0)

## 2016-05-28 LAB — BASIC METABOLIC PANEL
BUN: 21 mg/dL (ref 6–23)
CALCIUM: 9.9 mg/dL (ref 8.4–10.5)
CO2: 31 mEq/L (ref 19–32)
Chloride: 104 mEq/L (ref 96–112)
Creatinine, Ser: 1.03 mg/dL (ref 0.40–1.50)
GFR: 77.47 mL/min (ref >60.00)
Glucose, Bld: 97 mg/dL (ref 70–99)
POTASSIUM: 4.3 meq/L (ref 3.5–5.1)
SODIUM: 142 meq/L (ref 135–145)

## 2016-05-28 LAB — HEPATIC FUNCTION PANEL
ALBUMIN: 4.4 g/dL (ref 3.5–5.2)
ALK PHOS: 62 U/L (ref 39–117)
ALT: 33 U/L (ref 0–53)
AST: 25 U/L (ref 0–37)
BILIRUBIN DIRECT: 0.1 mg/dL (ref 0.0–0.3)
BILIRUBIN TOTAL: 0.6 mg/dL (ref 0.2–1.2)
Total Protein: 6.9 g/dL (ref 6.0–8.3)

## 2016-05-28 LAB — CBC WITH DIFFERENTIAL/PLATELET
BASOS PCT: 0.9 % (ref 0.0–3.0)
Basophils Absolute: 0.1 10*3/uL (ref 0.0–0.1)
EOS PCT: 2.1 % (ref 0.0–5.0)
Eosinophils Absolute: 0.2 10*3/uL (ref 0.0–0.7)
HCT: 41.2 % (ref 39.0–52.0)
Hemoglobin: 13.8 g/dL (ref 13.0–17.0)
LYMPHS ABS: 2.9 10*3/uL (ref 0.7–4.0)
Lymphocytes Relative: 28.5 % (ref 12.0–46.0)
MCHC: 33.5 g/dL (ref 30.0–36.0)
MCV: 89.9 fl (ref 78.0–100.0)
MONO ABS: 1 10*3/uL (ref 0.1–1.0)
MONOS PCT: 9.3 % (ref 3.0–12.0)
NEUTROS ABS: 6.1 10*3/uL (ref 1.4–7.7)
NEUTROS PCT: 59.2 % (ref 43.0–77.0)
PLATELETS: 297 10*3/uL (ref 150.0–400.0)
RBC: 4.57 Mil/uL (ref 4.22–5.81)
RDW: 13 % (ref 11.5–15.5)
WBC: 10.3 10*3/uL (ref 4.0–10.5)

## 2016-05-28 LAB — URINALYSIS, ROUTINE W REFLEX MICROSCOPIC
BILIRUBIN URINE: NEGATIVE
Hgb urine dipstick: NEGATIVE
Ketones, ur: NEGATIVE
LEUKOCYTES UA: NEGATIVE
Nitrite: NEGATIVE
PH: 5.5 (ref 5.0–8.0)
Specific Gravity, Urine: <=1.005 — AB (ref 1.000–1.030)
TOTAL PROTEIN, URINE-UPE24: NEGATIVE
Urine Glucose: NEGATIVE
Urobilinogen, UA: 0.2 (ref 0.0–1.0)

## 2016-05-28 LAB — PSA: PSA: 1.23 ng/mL (ref 0.10–4.00)

## 2016-05-28 LAB — TSH: TSH: 1.2 u[IU]/mL (ref 0.35–4.50)

## 2016-05-28 MED ORDER — SULFAMETHOXAZOLE-TRIMETHOPRIM 800-160 MG PO TABS: 1.0000 | ORAL_TABLET | Freq: Two times a day (BID) | ORAL | 0 refills | Status: DC

## 2016-05-28 MED ORDER — EZETIMIBE 10 MG PO TABS: 10.0000 mg | ORAL_TABLET | Freq: Every day | ORAL | 3 refills | Status: DC

## 2016-05-28 NOTE — Patient Instructions (Signed)
Please take all new medication as prescribed - the sulfa med for infection, and the zetia 10 mg per day for cholesterol  Please continue all other medications as before, and refills have been done if requested.  Please have the pharmacy call with any other refills you may need.  Please continue your efforts at being more active, low cholesterol diet, and weight control.  You are otherwise up to date with prevention measures today.  Please keep your appointments with your specialists as you may have planned  Please go to the LAB in the Basement (turn left off the elevator) for the tests to be done today  You will be contacted by phone if any changes need to be made immediately.  Otherwise, you will receive a letter about your results with an explanation, but please check with MyChart first.  Please remember to sign up for MyChart if you have not done so, as this will be important to you in the future with finding out test results, communicating by private email, and scheduling acute appointments online when needed.  Please return in 1 year for your yearly visit, or sooner if needed, with Lab testing done 3-5 days before

## 2016-05-28 NOTE — Progress Notes (Signed)
Pre visit review using our clinic review tool, if applicable. No additional management support is needed unless otherwise documented below in the visit note. 

## 2016-05-28 NOTE — Progress Notes (Signed)
Subjective:    Patient ID: [name] Michael, male    DOB: 1950/08/24, 65 y.o.   MRN: [id number]  HPI Here for wellness and f/u;  Overall doing ok;  Pt denies Chest pain, worsening SOB, DOE, wheezing, orthopnea, PND, worsening LE edema, palpitations, dizziness or syncope.  Pt denies neurological change such as new headache, facial or extremity weakness.  Pt denies polydipsia, polyuria, or low sugar symptoms. Pt states overall good compliance with treatment and medications, good tolerability, and has been trying to follow appropriate diet.  Pt denies worsening depressive symptoms, suicidal ideation or panic. No fever, night sweats, wt loss, loss of appetite, or other constitutional symptoms.  Pt states good ability with ADL's, has low fall risk, home safety reviewed and adequate, no other significant changes in hearing or vision, and only occasionally active with exercise. No other significant changes in basic hx.  Now taking SuperBetaProstate due to urinary mild slow flow and frequency and nocturia for several months, seems to be helping, but also now with perineal pain and testicle aching, similar to prior episodes of prostatitis, first episode in a couple of yrs.  Has allergy to doxy and cipro, asks for sulfa med tx, as also does not interact with RA meds. Has some ED symtpoms worsening only with prostatitis episodes.  Does not want prosacr due to lower testosterone related. Declines Viagra for now.  Sees rheum every 3 mo as well with what sounds like cbc and lft's.   Past Medical History:  Diagnosis Date  . ALLERGIC RHINITIS 04/30/2007  . ANEMIA-NOS 07/12/2007  . ANXIETY 02/17/2009  . 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 CAD (coronary artery disease)    LHC 11/09 proximal RCA 30%, mid RCA 30%, distal RCA with serial 30-40%, EF 40-45%, normal right heart pressures.  Echo 11/10 mild LVH, EF A999333, grade 1 diastolic dysfunction, aortic root moderately dilated, mild LAE.;  Myoview 11/10: Small lateral scar,  no ischemia, EF 61%.    . DEPRESSIVE DISORDER 02/14/2009  . History of pulmonary embolism 03/09/2008  . HYPERLIPIDEMIA 04/27/2007  . HYPERTENSION 04/27/2007  . INSOMNIA UNSPECIFIED 02/20/2010  . 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 Nonspecific abnormal toxicological findings 03/14/2008  . OBESITY 04/30/2007  . OSTEOARTHRITIS 04/30/2007  . 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 PROSTATITIS, HX OF 04/30/2007  . RESTRICTIVE LUNG DISEASE 06/20/2009  . THORACIC AORTIC ANEURYSM 07/06/2009   Chest CT 11/12: ascending thoracic aortic aneurysm stable at 42 mm.   Marland Kitchen UNSPECIFIED MYALGIA AND MYOSITIS 02/14/2009   Past Surgical History:  Procedure Laterality Date  . APPENDECTOMY      reports that he has never smoked. He does not have any smokeless tobacco history on file. He reports that he does not drink alcohol or use drugs. family history includes Alcohol abuse in his other; Diabetes in his brother, brother, father, mother, and other; Heart disease in his brother, father, mother, and other; Hyperlipidemia in his father and mother; Hypertension in his brother, other, and sister; Stroke in his father, mother, and other. Allergies  Allergen Reactions  . Atorvastatin     REACTION: myalgias  . Ciprofloxacin Other (See Comments)    myalgias  . Crestor [Rosuvastatin]     myalgias  . Doxycycline   . Hydroxychloroquine Sulfate     REACTION: GI upset and H, pt is currently taking and still has some GI problems but is tolerating it   Current Outpatient Prescriptions on File Prior to Visit  Medication Sig Dispense Refill  . aspirin 81 MG tablet  Take 81 mg by mouth daily. Reported on 12/25/2015    . Cholecalciferol (VITAMIN D) 2000 UNITS CAPS Take by mouth daily. Reported on 12/25/2015    . clonazePAM (KLONOPIN) 1 MG tablet take 1 tablet by mouth twice a day if needed for anxiety 60 tablet 5  . folic acid (FOLVITE) Q000111Q MCG tablet Take by mouth 2 (two) times daily.     . Glucos-Chond-Hyal Ac-Ca Fructo (MOVE FREE JOINT  HEALTH ADVANCE) TABS Take 1 tablet by mouth daily. Reported on 12/25/2015    . hydroxychloroquine (PLAQUENIL) 200 MG tablet Take 200 mg by mouth 2 (two) times daily.     Marland Kitchen leflunomide (ARAVA) 20 MG tablet Take 20 mg by mouth daily.      Marland Kitchen losartan (COZAAR) 25 MG tablet Take 1 tablet (25 mg total) by mouth 2 (two) times daily. Yearly physical w/labs are due must see Md for refills 60 tablet 0  . Omega-3 Fatty Acids (FISH OIL) 1200 MG CAPS Take by mouth daily.      Marland Kitchen omeprazole (PRILOSEC) 20 MG capsule Take 1 capsule (20 mg total) by mouth 2 (two) times daily. 60 capsule 5  . Specialty Vitamins Products (ONE-A-DAY ENERGY FORMULA) TABS Take by mouth daily. Reported on 12/25/2015    . tamsulosin (FLOMAX) 0.4 MG CAPS capsule Take 1 capsule (0.4 mg total) by mouth daily. Yearly physical is due must see Md for refills 30 capsule 0   No current facility-administered medications on file prior to visit.    Review of Systems Constitutional: Negative for increased diaphoresis, or other activity, appetite or siginficant weight change other than noted HENT: Negative for worsening hearing loss, ear pain, facial swelling, mouth sores and neck stiffness.   Eyes: Negative for other worsening pain, redness or visual disturbance.  Respiratory: Negative for choking or stridor Cardiovascular: Negative for other chest pain and palpitations.  Gastrointestinal: Negative for worsening diarrhea, blood in stool, or abdominal distention Genitourinary: Negative for hematuria, flank pain or change in urine volume.  Musculoskeletal: Negative for myalgias or other joint complaints.  Skin: Negative for other color change and wound or drainage.  Neurological: Negative for syncope and numbness. other than noted Hematological: Negative for adenopathy. or other swelling Psychiatric/Behavioral: Negative for hallucinations, SI, self-injury, decreased concentration or other worsening agitation.  All other system neg per pt     Objective:   Physical Exam BP 132/72   Pulse 72   Temp 98 F (36.7 C) (Oral)   Resp 20   Wt 203 lb 2 oz (92.1 kg)   SpO2 94%   BMI 28.33 kg/m  VS noted,  Constitutional: Pt is oriented to person, place, and time. Appears well-developed and well-nourished, in no significant distress Head: Normocephalic and atraumatic  Eyes: Conjunctivae and EOM are normal. Pupils are equal, round, and reactive to light Right Ear: External ear normal.  Left Ear: External ear normal Nose: Nose normal.  Mouth/Throat: Oropharynx is clear and moist  Neck: Normal range of motion. Neck supple. No JVD present. No tracheal deviation present or significant neck LA or mass Cardiovascular: Normal rate, regular rhythm, normal heart sounds and intact distal pulses.   Pulmonary/Chest: Effort normal and breath sounds without rales or wheezing  Abdominal: Soft. Bowel sounds are normal. NT. No HSM  Musculoskeletal: Normal range of motion. Exhibits no edema Lymphadenopathy: Has no cervical adenopathy.  DRE: deferred per pt Neurological: Pt is alert and oriented to person, place, and time. Pt has normal reflexes. No cranial nerve deficit. Motor  grossly intact Skin: Skin is warm and dry. No rash noted or new ulcers Psychiatric:  Has normal mood and affect. Behavior is normal.  No other significant exam findings    Assessment & Plan:

## 2016-05-29 ENCOUNTER — Other Ambulatory Visit: Payer: Self-pay | Admitting: Internal Medicine

## 2016-05-29 NOTE — Telephone Encounter (Signed)
faxed

## 2016-05-29 NOTE — Telephone Encounter (Signed)
Done hardcopy to Corinne  

## 2016-05-30 ENCOUNTER — Other Ambulatory Visit: Payer: Self-pay | Admitting: Internal Medicine

## 2016-06-01 ENCOUNTER — Other Ambulatory Visit: Payer: Self-pay | Admitting: Internal Medicine

## 2016-06-03 NOTE — Assessment & Plan Note (Signed)
Mild to mod, for antibx course,  to f/u any worsening symptoms or concerns  In addition to the time spent performing CPE, I spent an additional 15 minutes face to face,in which greater than 50% of this time was spent in counseling and coordination of care for patient's illness as documented.   

## 2016-06-03 NOTE — Assessment & Plan Note (Signed)
Lab Results  Component Value Date   LDLCALC 149 (H) 05/28/2016   Has been stating interant, for zetia 10 qd,  to f/u any worsening symptoms or concerns

## 2016-06-03 NOTE — Assessment & Plan Note (Signed)

## 2016-06-07 ENCOUNTER — Telehealth: Payer: Self-pay | Admitting: *Deleted

## 2016-06-07 DIAGNOSIS — N419 Inflammatory disease of prostate, unspecified: Secondary | ICD-10-CM

## 2016-06-07 MED ORDER — SULFAMETHOXAZOLE-TRIMETHOPRIM 800-160 MG PO TABS: 1.0000 | ORAL_TABLET | Freq: Two times a day (BID) | ORAL | 0 refills | Status: DC

## 2016-06-07 NOTE — Telephone Encounter (Signed)
Notified pt w/MD response.../lmb 

## 2016-06-07 NOTE — Telephone Encounter (Signed)
[place] for refill toi completel approx total 3 wk course - done erx

## 2016-06-07 NOTE — Telephone Encounter (Signed)
Rec'd call pt states he has taking last pill for prostatis, but sxs has not completely gone away. Wanting to get a refill on antibiotic...[name] Michael

## 2016-06-17 NOTE — Telephone Encounter (Signed)
Pt advised and agrees to Urology referral

## 2016-06-17 NOTE — Telephone Encounter (Signed)
Unfortunately, this is not very likely to be effective.  I can refer to urology if he likes

## 2016-06-17 NOTE — Telephone Encounter (Addendum)
Patient has called back stating he has finished the second round of antibiotics and he still has prostate infection.  Patient is requesting another round of antibiotic.  Patient states to call him at work with response at [phone]

## 2016-06-18 NOTE — Telephone Encounter (Signed)
Ok, will do

## 2016-06-18 NOTE — Addendum Note (Signed)
Addended by: Biagio Borg on: 06/18/2016 06:54 PM   Modules accepted: Orders

## 2016-06-30 ENCOUNTER — Other Ambulatory Visit: Payer: Self-pay | Admitting: Internal Medicine

## 2016-07-01 ENCOUNTER — Other Ambulatory Visit: Payer: Self-pay | Admitting: Internal Medicine

## 2016-07-04 ENCOUNTER — Encounter: Payer: Self-pay | Admitting: Internal Medicine

## 2016-07-06 ENCOUNTER — Other Ambulatory Visit: Payer: Self-pay | Admitting: Internal Medicine

## 2016-07-10 ENCOUNTER — Encounter: Payer: Self-pay | Admitting: Internal Medicine

## 2016-07-25 ENCOUNTER — Other Ambulatory Visit: Payer: Self-pay | Admitting: Internal Medicine

## 2016-08-01 ENCOUNTER — Other Ambulatory Visit: Payer: Self-pay | Admitting: Internal Medicine

## 2016-08-31 ENCOUNTER — Other Ambulatory Visit: Payer: Self-pay | Admitting: Internal Medicine

## 2016-09-03 ENCOUNTER — Other Ambulatory Visit: Payer: Self-pay | Admitting: Internal Medicine

## 2016-09-03 MED ORDER — CLONAZEPAM 1 MG PO TABS: ORAL_TABLET | ORAL | 5 refills | Status: DC

## 2016-09-03 NOTE — Telephone Encounter (Signed)
faxed

## 2016-09-03 NOTE — Telephone Encounter (Signed)
Done hardcopy to Corinne  

## 2016-12-04 ENCOUNTER — Other Ambulatory Visit: Payer: Self-pay | Admitting: Internal Medicine

## 2016-12-04 ENCOUNTER — Other Ambulatory Visit: Payer: Self-pay

## 2016-12-04 MED ORDER — CLONAZEPAM 1 MG PO TABS: ORAL_TABLET | ORAL | 5 refills | Status: DC

## 2016-12-04 NOTE — Telephone Encounter (Signed)
Done hardcopy to [place]  

## 2016-12-04 NOTE — Telephone Encounter (Signed)
Faxed

## 2017-04-03 ENCOUNTER — Encounter: Payer: Self-pay | Admitting: Internal Medicine

## 2017-04-03 ENCOUNTER — Other Ambulatory Visit (INDEPENDENT_AMBULATORY_CARE_PROVIDER_SITE_OTHER): Payer: Self-pay

## 2017-04-03 ENCOUNTER — Ambulatory Visit (INDEPENDENT_AMBULATORY_CARE_PROVIDER_SITE_OTHER): Payer: Self-pay | Admitting: Internal Medicine

## 2017-04-03 VITALS — BP 132/90 | HR 88 | Temp 98.4°F | Ht 71.0 in | Wt 211.0 lb

## 2017-04-03 DIAGNOSIS — I10 Essential (primary) hypertension: Secondary | ICD-10-CM

## 2017-04-03 DIAGNOSIS — E785 Hyperlipidemia, unspecified: Secondary | ICD-10-CM

## 2017-04-03 DIAGNOSIS — R202 Paresthesia of skin: Secondary | ICD-10-CM | POA: Insufficient documentation

## 2017-04-03 DIAGNOSIS — N320 Bladder-neck obstruction: Secondary | ICD-10-CM

## 2017-04-03 DIAGNOSIS — J309 Allergic rhinitis, unspecified: Secondary | ICD-10-CM

## 2017-04-03 DIAGNOSIS — N529 Male erectile dysfunction, unspecified: Secondary | ICD-10-CM

## 2017-04-03 LAB — URINALYSIS, ROUTINE W REFLEX MICROSCOPIC
BILIRUBIN URINE: NEGATIVE
HGB URINE DIPSTICK: NEGATIVE
Ketones, ur: NEGATIVE
LEUKOCYTES UA: NEGATIVE
NITRITE: NEGATIVE
RBC / HPF: NONE SEEN (ref 0–?)
Specific Gravity, Urine: <=1.005 — AB (ref 1.000–1.030)
TOTAL PROTEIN, URINE-UPE24: NEGATIVE
URINE GLUCOSE: NEGATIVE
Urobilinogen, UA: 0.2 (ref 0.0–1.0)
WBC, UA: NONE SEEN (ref 0–?)
pH: 6 (ref 5.0–8.0)

## 2017-04-03 LAB — BASIC METABOLIC PANEL
BUN: 12 mg/dL (ref 6–23)
CALCIUM: 9.9 mg/dL (ref 8.4–10.5)
CO2: 28 mEq/L (ref 19–32)
Chloride: 104 mEq/L (ref 96–112)
Creatinine, Ser: 1.15 mg/dL (ref 0.40–1.50)
GFR: 68.03 mL/min (ref >60.00)
Glucose, Bld: 79 mg/dL (ref 70–99)
POTASSIUM: 4.5 meq/L (ref 3.5–5.1)
SODIUM: 140 meq/L (ref 135–145)

## 2017-04-03 LAB — CBC WITH DIFFERENTIAL/PLATELET
Basophils Absolute: 0 10*3/uL (ref 0.0–0.1)
Basophils Relative: 0.5 % (ref 0.0–3.0)
EOS PCT: 2.5 % (ref 0.0–5.0)
Eosinophils Absolute: 0.2 10*3/uL (ref 0.0–0.7)
HCT: 41.7 % (ref 39.0–52.0)
HEMOGLOBIN: 13.6 g/dL (ref 13.0–17.0)
LYMPHS PCT: 34.6 % (ref 12.0–46.0)
Lymphs Abs: 3.1 10*3/uL (ref 0.7–4.0)
MCHC: 32.7 g/dL (ref 30.0–36.0)
MCV: 88.3 fl (ref 78.0–100.0)
MONO ABS: 1.3 10*3/uL — AB (ref 0.1–1.0)
Monocytes Relative: 14.4 % — ABNORMAL HIGH (ref 3.0–12.0)
Neutro Abs: 4.2 10*3/uL (ref 1.4–7.7)
Neutrophils Relative %: 48 % (ref 43.0–77.0)
Platelets: 307 10*3/uL (ref 150.0–400.0)
RBC: 4.72 Mil/uL (ref 4.22–5.81)
RDW: 13.4 % (ref 11.5–15.5)
WBC: 8.9 10*3/uL (ref 4.0–10.5)

## 2017-04-03 LAB — LIPID PANEL
CHOL/HDL RATIO: 7
Cholesterol: 218 mg/dL — ABNORMAL HIGH (ref 0–200)
HDL: 33.1 mg/dL — ABNORMAL LOW (ref >39.00)
NonHDL: 185.18
Triglycerides: 236 mg/dL — ABNORMAL HIGH (ref 0.0–149.0)
VLDL: 47.2 mg/dL — AB (ref 0.0–40.0)

## 2017-04-03 LAB — HEPATIC FUNCTION PANEL
ALBUMIN: 4.3 g/dL (ref 3.5–5.2)
ALT: 30 U/L (ref 0–53)
AST: 23 U/L (ref 0–37)
Alkaline Phosphatase: 84 U/L (ref 39–117)
Bilirubin, Direct: 0.1 mg/dL (ref 0.0–0.3)
Total Bilirubin: 0.5 mg/dL (ref 0.2–1.2)
Total Protein: 6.8 g/dL (ref 6.0–8.3)

## 2017-04-03 LAB — LDL CHOLESTEROL, DIRECT: Direct LDL: 131 mg/dL

## 2017-04-03 LAB — TSH: TSH: 1.04 u[IU]/mL (ref 0.35–4.50)

## 2017-04-03 LAB — PSA: PSA: 1.01 ng/mL (ref 0.10–4.00)

## 2017-04-03 MED ORDER — SILDENAFIL CITRATE 100 MG PO TABS: 50.0000 mg | ORAL_TABLET | Freq: Every day | ORAL | 11 refills | Status: DC | PRN

## 2017-04-03 MED ORDER — LOSARTAN POTASSIUM 100 MG PO TABS: 100.0000 mg | ORAL_TABLET | Freq: Every day | ORAL | 3 refills | Status: DC

## 2017-04-03 NOTE — Patient Instructions (Signed)
OK to increase the Losartan to 100 mg per day  OK for zyrtec and nasacort OTC for the allergies  Please continue all other medications as before, and refills have been done if requested.  Please have the pharmacy call with any other refills you may need.  Please continue your efforts at being more active, low cholesterol diet, and weight control.  You are otherwise up to date with prevention measures today.  Please keep your appointments with your specialists as you may have planned  Please go to the LAB in the Basement (turn left off the elevator) for the tests to be done tomorrow  You will be contacted by phone if any changes need to be made immediately.  Otherwise, you will receive a letter about your results with an explanation, but please check with MyChart first.  Please remember to sign up for MyChart if you have not done so, as this will be important to you in the future with finding out test results, communicating by private email, and scheduling acute appointments online when needed.  Please return in August 2019 for your yearly visit, or sooner if needed, with Lab testing done 3-5 days before

## 2017-04-03 NOTE — Progress Notes (Signed)
Subjective:    Patient ID: [name] Michael, male    DOB: 1951-03-30, 66 y.o.   MRN: [id number]  HPI  Here to f/u; overall doing ok,  Pt denies chest pain, increasing sob or doe, wheezing, orthopnea, PND, increased LE swelling, palpitations, dizziness or syncope.  Pt denies new neurological symptoms such as new headache, or facial or extremity weakness or numbness.  Pt denies polydipsia, polyuria, or low sugar episode.  Pt states overall good compliance with meds, mostly trying to follow appropriate diet, with wt overall stable,  but little exercise however. Does have several wks ongoing nasal allergy symptoms with clearish congestion, itch and sneezing, without fever, pain, ST, cough, swelling or wheezing, but seems to be assoc with a kind of vague swelling to the upper neck, without wheezing or sob.  BP was 141/100 yesterday, 144/101 similar last wk several times.  Wt os I[ several lbs.   Some better with increse on his own of losartan to 50 mg for a few days but still higher later in the day.  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  Podiatry advised to consider stopping as his soft tissue over foot bones was wearing thin.   Past Medical History:  Diagnosis Date  . ALLERGIC RHINITIS 04/30/2007  . ANEMIA-NOS 07/12/2007  . ANXIETY 02/17/2009  . 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 CAD (coronary artery disease)    LHC 11/09 proximal RCA 30%, mid RCA 30%, distal RCA with serial 30-40%, EF 40-45%, normal right heart pressures.  Echo 11/10 mild LVH, EF 40-98%, grade 1 diastolic dysfunction, aortic root moderately dilated, mild LAE.;  Myoview 11/10: Small lateral scar, no ischemia, EF 61%.    .  DEPRESSIVE DISORDER 02/14/2009  . History of pulmonary embolism 03/09/2008  . HYPERLIPIDEMIA 04/27/2007  . HYPERTENSION 04/27/2007  . INSOMNIA UNSPECIFIED 02/20/2010  . 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 Nonspecific abnormal toxicological findings 03/14/2008  . OBESITY 04/30/2007  . OSTEOARTHRITIS 04/30/2007  . 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 PROSTATITIS, HX OF 04/30/2007  . RESTRICTIVE LUNG DISEASE 06/20/2009  . THORACIC AORTIC ANEURYSM 07/06/2009   Chest CT 11/12: ascending thoracic aortic aneurysm stable at 42 mm.   Marland Kitchen UNSPECIFIED MYALGIA AND MYOSITIS 02/14/2009   Past Surgical History:  Procedure Laterality Date  . APPENDECTOMY      reports that he has never smoked. He has never used smokeless tobacco. He reports that he does not drink alcohol or use drugs. family history includes Alcohol abuse in his other; Diabetes in his brother, brother, father, mother, and other; Heart disease in his brother, father, mother, and other; Hyperlipidemia in his father and mother; Hypertension in his brother, other, and sister; Stroke in his father, mother, and other. Allergies  Allergen Reactions  . Atorvastatin     REACTION: myalgias  . Ciprofloxacin Other (See Comments)    myalgias  . Crestor [Rosuvastatin]     myalgias  . Doxycycline   . Hydroxychloroquine Sulfate     REACTION: GI upset and H, pt is currently taking and still has some GI problems but is tolerating it   Current Outpatient Prescriptions on File  Prior to Visit  Medication Sig Dispense Refill  . aspirin 81 MG tablet Take 81 mg by mouth daily. Reported on 12/25/2015    . celecoxib (CELEBREX) 200 MG capsule take 1 capsule by mouth twice a day if needed 60 capsule 11  . Cholecalciferol (VITAMIN D) 2000 UNITS CAPS Take by mouth daily. Reported on 12/25/2015    . clonazePAM (KLONOPIN) 1 MG tablet take 1 tablet by mouth twice a day if needed for anxiety 60 tablet 5  . ezetimibe (ZETIA) 10 MG tablet Take 1 tablet (10 mg total) by mouth  daily. 90 tablet 3  . folic acid (FOLVITE) 161 MCG tablet Take by mouth 2 (two) times daily.     . Glucos-Chond-Hyal Ac-Ca Fructo (MOVE FREE JOINT HEALTH ADVANCE) TABS Take 1 tablet by mouth daily. Reported on 12/25/2015    . hydroxychloroquine (PLAQUENIL) 200 MG tablet Take 200 mg by mouth 2 (two) times daily.     Marland Kitchen leflunomide (ARAVA) 20 MG tablet Take 20 mg by mouth daily.      . metoprolol tartrate (LOPRESSOR) 25 MG tablet take 1 tablet by mouth twice a day 180 tablet 3  . Omega-3 Fatty Acids (FISH OIL) 1200 MG CAPS Take by mouth daily.      Marland Kitchen omeprazole (PRILOSEC) 20 MG capsule take 1 capsule by mouth twice a day 60 capsule 5  . Specialty Vitamins Products (ONE-A-DAY ENERGY FORMULA) TABS Take by mouth daily. Reported on 12/25/2015    . sulfamethoxazole-trimethoprim (BACTRIM DS) 800-160 MG tablet Take 1 tablet by mouth 2 (two) times daily. 20 tablet 0  . tamsulosin (FLOMAX) 0.4 MG CAPS capsule Take 1 capsule (0.4 mg total) by mouth daily. 90 capsule 1  . zolpidem (AMBIEN CR) 12.5 MG CR tablet TAKE ONE TABLET BY MOUTH AT BEDTIME AS NEEDED  90 tablet 1   No current facility-administered medications on file prior to visit.    Review of Systems  Constitutional: Negative for other unusual diaphoresis or sweats HENT: Negative for ear discharge or swelling Eyes: Negative for other worsening visual disturbances Respiratory: Negative for stridor or other swelling  Gastrointestinal: Negative for worsening distension or other blood Genitourinary: Negative for retention or other urinary change Musculoskeletal: Negative for other MSK pain or swelling Skin: Negative for color change or other new lesions Neurological: Negative for worsening tremors and other numbness  Psychiatric/Behavioral: Negative for worsening agitation or other fatigue All other system neg per pt    Objective:   Physical Exam BP 132/90   Pulse 88   Temp 98.4 F (36.9 C) (Oral)   Ht 5\' 11"  (1.803 m)   Wt 211 lb (95.7 kg)    SpO2 96%   BMI 29.43 kg/m  VS noted,  Constitutional: Pt appears in NAD HENT: Head: NCAT.  Right Ear: External ear normal.  Left Ear: External ear normal.  Eyes: . Pupils are equal, round, and reactive to light. Conjunctivae and EOM are normal Bilat tm's with mild erythema.  Max sinus areas non tender.  Pharynx with mild erythema, no exudate Nose: without d/c or deformity Neck: Neck supple. Gross normal ROM Cardiovascular: Normal rate and regular rhythm.   Pulmonary/Chest: Effort normal and breath sounds without rales or wheezing.  Abd:  Soft, NT, ND, + BS, no organomegaly Neurological: Pt is alert. At baseline orientation, motor grossly intact Skin: Skin is warm. No rashes, other new lesions, no LE edema Psychiatric: Pt behavior is normal without agitation     Assessment & Plan:

## 2017-04-06 NOTE — Assessment & Plan Note (Signed)
Exam benign, ok to follow

## 2017-04-06 NOTE — Assessment & Plan Note (Signed)
Mild to mod, for zyrtec and nasacort otc prn, to f/u any worsening symptoms or concerns

## 2017-04-06 NOTE — Assessment & Plan Note (Addendum)
Mild uncontrolled, to increase losartan 100 qd, o/w stable overall by history and exam, recent data reviewed with pt, and pt to o/w continue medical treatment as before,  to f/u any worsening symptoms or concerns BP Readings from Last 3 Encounters:  04/03/17 132/90  05/28/16 132/72  12/25/15 130/84

## 2017-04-06 NOTE — Assessment & Plan Note (Signed)
For lower chol diet, ddclines f/u lab today, has been statin intolerant

## 2017-04-06 NOTE — Assessment & Plan Note (Signed)
Also asks for viagra - done erx

## 2017-05-13 ENCOUNTER — Other Ambulatory Visit: Payer: Self-pay | Admitting: Internal Medicine

## 2017-05-28 ENCOUNTER — Encounter: Payer: BLUE CROSS/BLUE SHIELD | Admitting: Internal Medicine

## 2017-05-29 ENCOUNTER — Other Ambulatory Visit: Payer: Self-pay | Admitting: Internal Medicine

## 2017-05-30 NOTE — Telephone Encounter (Signed)
Faxed to [facility] 

## 2017-06-19 ENCOUNTER — Other Ambulatory Visit: Payer: Self-pay | Admitting: Internal Medicine

## 2017-07-04 ENCOUNTER — Other Ambulatory Visit: Payer: Self-pay | Admitting: Internal Medicine

## 2017-07-04 NOTE — Telephone Encounter (Signed)
Done erx 

## 2017-08-06 ENCOUNTER — Other Ambulatory Visit: Payer: Self-pay | Admitting: Internal Medicine

## 2017-08-06 NOTE — Telephone Encounter (Signed)
Done erx 

## 2017-08-20 ENCOUNTER — Emergency Department (HOSPITAL_COMMUNITY): Payer: Self-pay

## 2017-08-20 ENCOUNTER — Encounter (HOSPITAL_COMMUNITY): Payer: Self-pay

## 2017-08-20 ENCOUNTER — Inpatient Hospital Stay (HOSPITAL_COMMUNITY)
Admission: EM | Disposition: A | Discharge status: Home / Self Care | Payer: Self-pay | Source: Home / Self Care | Attending: Nephrology

## 2017-08-20 ENCOUNTER — Other Ambulatory Visit: Payer: Self-pay

## 2017-08-20 ENCOUNTER — Inpatient Hospital Stay (HOSPITAL_COMMUNITY)
Admission: EM | Admit: 2017-08-20 | Discharge: 2017-08-22 | DRG: 247 | Disposition: A | Discharge status: Home / Self Care | Payer: Self-pay | Attending: Nephrology | Admitting: Nephrology

## 2017-08-20 DIAGNOSIS — M069 Rheumatoid arthritis, unspecified: Secondary | ICD-10-CM | POA: Diagnosis present

## 2017-08-20 DIAGNOSIS — I712 Thoracic aortic aneurysm, without rupture, unspecified: Secondary | ICD-10-CM | POA: Diagnosis present

## 2017-08-20 DIAGNOSIS — I714 Abdominal aortic aneurysm, without rupture, unspecified: Secondary | ICD-10-CM | POA: Diagnosis present

## 2017-08-20 DIAGNOSIS — I2510 Atherosclerotic heart disease of native coronary artery without angina pectoris: Secondary | ICD-10-CM | POA: Diagnosis present

## 2017-08-20 DIAGNOSIS — N179 Acute kidney failure, unspecified: Secondary | ICD-10-CM | POA: Diagnosis present

## 2017-08-20 DIAGNOSIS — N289 Disorder of kidney and ureter, unspecified: Secondary | ICD-10-CM

## 2017-08-20 DIAGNOSIS — Z86711 Personal history of pulmonary embolism: Secondary | ICD-10-CM

## 2017-08-20 DIAGNOSIS — E781 Pure hyperglyceridemia: Secondary | ICD-10-CM | POA: Diagnosis present

## 2017-08-20 DIAGNOSIS — R739 Hyperglycemia, unspecified: Secondary | ICD-10-CM | POA: Diagnosis present

## 2017-08-20 DIAGNOSIS — I214 Non-ST elevation (NSTEMI) myocardial infarction: Principal | ICD-10-CM

## 2017-08-20 DIAGNOSIS — E782 Mixed hyperlipidemia: Secondary | ICD-10-CM

## 2017-08-20 DIAGNOSIS — I249 Acute ischemic heart disease, unspecified: Secondary | ICD-10-CM

## 2017-08-20 DIAGNOSIS — G453 Amaurosis fugax: Secondary | ICD-10-CM | POA: Diagnosis present

## 2017-08-20 DIAGNOSIS — Z8249 Family history of ischemic heart disease and other diseases of the circulatory system: Secondary | ICD-10-CM

## 2017-08-20 DIAGNOSIS — E785 Hyperlipidemia, unspecified: Secondary | ICD-10-CM | POA: Diagnosis present

## 2017-08-20 DIAGNOSIS — I25110 Atherosclerotic heart disease of native coronary artery with unstable angina pectoris: Secondary | ICD-10-CM | POA: Insufficient documentation

## 2017-08-20 DIAGNOSIS — Z955 Presence of coronary angioplasty implant and graft: Secondary | ICD-10-CM

## 2017-08-20 DIAGNOSIS — Z79899 Other long term (current) drug therapy: Secondary | ICD-10-CM

## 2017-08-20 DIAGNOSIS — R079 Chest pain, unspecified: Secondary | ICD-10-CM

## 2017-08-20 DIAGNOSIS — Z6829 Body mass index (BMI) 29.0-29.9, adult: Secondary | ICD-10-CM

## 2017-08-20 DIAGNOSIS — I10 Essential (primary) hypertension: Secondary | ICD-10-CM | POA: Diagnosis present

## 2017-08-20 DIAGNOSIS — F410 Panic disorder [episodic paroxysmal anxiety] without agoraphobia: Secondary | ICD-10-CM | POA: Diagnosis present

## 2017-08-20 DIAGNOSIS — E669 Obesity, unspecified: Secondary | ICD-10-CM | POA: Diagnosis present

## 2017-08-20 HISTORY — DX: Atherosclerotic heart disease of native coronary artery with unstable angina pectoris: I25.110

## 2017-08-20 HISTORY — PX: CORONARY STENT INTERVENTION: CATH118234

## 2017-08-20 HISTORY — PX: CORONARY ANGIOPLASTY WITH STENT PLACEMENT: SHX49

## 2017-08-20 HISTORY — DX: Reserved for concepts with insufficient information to code with codable children: IMO0002

## 2017-08-20 HISTORY — DX: Non-ST elevation (NSTEMI) myocardial infarction: I21.4

## 2017-08-20 HISTORY — DX: Migraine, unspecified, not intractable, without status migrainosus: G43.909

## 2017-08-20 HISTORY — DX: Presence of coronary angioplasty implant and graft: Z95.5

## 2017-08-20 HISTORY — DX: Rheumatoid arthritis, unspecified: M06.9

## 2017-08-20 HISTORY — PX: LEFT HEART CATH AND CORONARY ANGIOGRAPHY: CATH118249

## 2017-08-20 HISTORY — DX: Systemic lupus erythematosus, unspecified: M32.9

## 2017-08-20 HISTORY — DX: Gastro-esophageal reflux disease without esophagitis: K21.9

## 2017-08-20 LAB — TROPONIN I
TROPONIN I: 3.38 ng/mL — AB (ref <0.03)
Troponin I: 0.19 ng/mL (ref <0.03)
Troponin I: 0.36 ng/mL (ref <0.03)

## 2017-08-20 LAB — I-STAT TROPONIN, ED
Troponin i, poc: 0.09 ng/mL (ref 0.00–0.08)
Troponin i, poc: 0.11 ng/mL (ref 0.00–0.08)

## 2017-08-20 LAB — CBC
HCT: 36.8 % — ABNORMAL LOW (ref 39.0–52.0)
HEMATOCRIT: 40.5 % (ref 39.0–52.0)
HEMOGLOBIN: 13.2 g/dL (ref 13.0–17.0)
HEMOGLOBIN: 11.8 g/dL — AB (ref 13.0–17.0)
MCH: 29.3 pg (ref 26.0–34.0)
MCH: 28.8 pg (ref 26.0–34.0)
MCHC: 32.6 g/dL (ref 30.0–36.0)
MCHC: 32.1 g/dL (ref 30.0–36.0)
MCV: 90 fL (ref 78.0–100.0)
MCV: 89.8 fL (ref 78.0–100.0)
PLATELETS: 284 10*3/uL (ref 150–400)
Platelets: 292 10*3/uL (ref 150–400)
RBC: 4.5 MIL/uL (ref 4.22–5.81)
RBC: 4.1 MIL/uL — AB (ref 4.22–5.81)
RDW: 12.8 % (ref 11.5–15.5)
RDW: 13.2 % (ref 11.5–15.5)
WBC: 8.7 10*3/uL (ref 4.0–10.5)
WBC: 8.5 10*3/uL (ref 4.0–10.5)

## 2017-08-20 LAB — BASIC METABOLIC PANEL
ANION GAP: 12 (ref 5–15)
BUN: 16 mg/dL (ref 6–20)
CO2: 23 mmol/L (ref 22–32)
Calcium: 9.4 mg/dL (ref 8.9–10.3)
Chloride: 103 mmol/L (ref 101–111)
Creatinine, Ser: 1.46 mg/dL — ABNORMAL HIGH (ref 0.61–1.24)
GFR calc Af Amer: 57 mL/min — ABNORMAL LOW (ref >60)
GFR calc non Af Amer: 49 mL/min — ABNORMAL LOW (ref >60)
GLUCOSE: 132 mg/dL — AB (ref 65–99)
POTASSIUM: 4.4 mmol/L (ref 3.5–5.1)
Sodium: 138 mmol/L (ref 135–145)

## 2017-08-20 LAB — POCT ACTIVATED CLOTTING TIME
ACTIVATED CLOTTING TIME: 274 s
Activated Clotting Time: 313 seconds

## 2017-08-20 LAB — TSH: TSH: 1.466 u[IU]/mL (ref 0.350–4.500)

## 2017-08-20 LAB — HEMOGLOBIN A1C
Hgb A1c MFr Bld: 5.2 % (ref 4.8–5.6)
Mean Plasma Glucose: 102.54 mg/dL

## 2017-08-20 LAB — CK: Total CK: 126 U/L (ref 49–397)

## 2017-08-20 SURGERY — LEFT HEART CATH AND CORONARY ANGIOGRAPHY
Anesthesia: LOCAL

## 2017-08-20 MED ORDER — SODIUM CHLORIDE 0.9% FLUSH: 3.0000 mL | INTRAVENOUS | Status: DC | PRN

## 2017-08-20 MED ORDER — SODIUM CHLORIDE 0.9 % WEIGHT BASED INFUSION: 3.0000 mL/kg/h | INTRAVENOUS | Status: DC

## 2017-08-20 MED ORDER — METOPROLOL TARTRATE 25 MG PO TABS
25.0000 mg | ORAL_TABLET | Freq: Two times a day (BID) | ORAL | Status: DC
  Administered 2017-08-20 – 2017-08-22 (×4): 25 mg via ORAL
  Filled 2017-08-20 (×4): qty 1

## 2017-08-20 MED ORDER — NITROGLYCERIN 0.4 MG SL SUBL: 0.4000 mg | SUBLINGUAL_TABLET | SUBLINGUAL | Status: DC | PRN

## 2017-08-20 MED ORDER — VERAPAMIL HCL 2.5 MG/ML IV SOLN
INTRAVENOUS | Status: DC | PRN
  Administered 2017-08-20 (×2): 200 ug via INTRACORONARY
  Administered 2017-08-20: 100 ug via INTRACORONARY
  Administered 2017-08-20: 200 ug via INTRACORONARY

## 2017-08-20 MED ORDER — MORPHINE SULFATE (PF) 4 MG/ML IV SOLN: 2.0000 mg | INTRAVENOUS | Status: DC | PRN

## 2017-08-20 MED ORDER — NITROGLYCERIN 1 MG/10 ML FOR IR/CATH LAB
INTRA_ARTERIAL | Status: DC | PRN
  Administered 2017-08-20: 200 ug via INTRACORONARY
  Administered 2017-08-20: 150 ug via INTRACORONARY

## 2017-08-20 MED ORDER — VERAPAMIL HCL 2.5 MG/ML IV SOLN
INTRAVENOUS | Status: DC | PRN
  Administered 2017-08-20: 10 mL via INTRA_ARTERIAL

## 2017-08-20 MED ORDER — PRAVASTATIN SODIUM 40 MG PO TABS: 40.0000 mg | ORAL_TABLET | Freq: Every day | ORAL | Status: DC

## 2017-08-20 MED ORDER — MIDAZOLAM HCL 2 MG/2ML IJ SOLN
INTRAMUSCULAR | Status: DC | PRN
  Administered 2017-08-20: 2 mg via INTRAVENOUS
  Administered 2017-08-20: 1 mg via INTRAVENOUS

## 2017-08-20 MED ORDER — TIROFIBAN HCL IN NACL 5-0.9 MG/100ML-% IV SOLN
INTRAVENOUS | Status: AC
  Filled 2017-08-20: qty 100

## 2017-08-20 MED ORDER — HEART ATTACK BOUNCING BOOK
Freq: Once | Status: AC
  Administered 2017-08-20: 1
  Filled 2017-08-20: qty 1

## 2017-08-20 MED ORDER — TIROFIBAN HCL IN NACL 5-0.9 MG/100ML-% IV SOLN
INTRAVENOUS | Status: AC | PRN
  Administered 2017-08-20: 0.15 ug/kg/min
  Administered 2017-08-20: 0.15 ug/kg/min via INTRAVENOUS

## 2017-08-20 MED ORDER — SODIUM CHLORIDE 0.9% FLUSH: 3.0000 mL | Freq: Two times a day (BID) | INTRAVENOUS | Status: DC

## 2017-08-20 MED ORDER — MORPHINE SULFATE (PF) 4 MG/ML IV SOLN
INTRAVENOUS | Status: AC
  Filled 2017-08-20: qty 1

## 2017-08-20 MED ORDER — LOSARTAN POTASSIUM 50 MG PO TABS: 100.0000 mg | ORAL_TABLET | Freq: Every day | ORAL | Status: DC

## 2017-08-20 MED ORDER — NITROGLYCERIN 1 MG/10 ML FOR IR/CATH LAB
INTRA_ARTERIAL | Status: AC
  Filled 2017-08-20: qty 10

## 2017-08-20 MED ORDER — IOPAMIDOL (ISOVUE-370) INJECTION 76%
INTRAVENOUS | Status: AC
  Filled 2017-08-20: qty 100

## 2017-08-20 MED ORDER — VERAPAMIL HCL 2.5 MG/ML IV SOLN
INTRAVENOUS | Status: AC
  Filled 2017-08-20: qty 2

## 2017-08-20 MED ORDER — SODIUM CHLORIDE 0.9 % IV SOLN: 250.0000 mL | INTRAVENOUS | Status: DC | PRN

## 2017-08-20 MED ORDER — LABETALOL HCL 5 MG/ML IV SOLN
INTRAVENOUS | Status: AC
  Filled 2017-08-20: qty 4

## 2017-08-20 MED ORDER — LIDOCAINE HCL (PF) 1 % IJ SOLN
INTRAMUSCULAR | Status: DC | PRN
  Administered 2017-08-20: 2 mL via SUBCUTANEOUS

## 2017-08-20 MED ORDER — ZOLPIDEM TARTRATE 5 MG PO TABS
5.0000 mg | ORAL_TABLET | Freq: Every evening | ORAL | Status: DC | PRN
  Administered 2017-08-21: 5 mg via ORAL
  Filled 2017-08-20: qty 1

## 2017-08-20 MED ORDER — FENTANYL CITRATE (PF) 100 MCG/2ML IJ SOLN
INTRAMUSCULAR | Status: AC
  Filled 2017-08-20: qty 2

## 2017-08-20 MED ORDER — HYDRALAZINE HCL 20 MG/ML IJ SOLN: 5.0000 mg | INTRAMUSCULAR | Status: DC | PRN

## 2017-08-20 MED ORDER — IOPAMIDOL (ISOVUE-370) INJECTION 76%
INTRAVENOUS | Status: AC
  Administered 2017-08-20: 100 mL
  Filled 2017-08-20: qty 100

## 2017-08-20 MED ORDER — ASPIRIN EC 81 MG PO TBEC: 81.0000 mg | DELAYED_RELEASE_TABLET | Freq: Every day | ORAL | Status: DC

## 2017-08-20 MED ORDER — SODIUM CHLORIDE 0.9 % WEIGHT BASED INFUSION: 1.0000 mL/kg/h | INTRAVENOUS | Status: DC

## 2017-08-20 MED ORDER — ACTIVE PARTNERSHIP FOR HEALTH OF YOUR HEART BOOK
Freq: Once | Status: AC
  Administered 2017-08-21: 06:00:00 1
  Filled 2017-08-20 (×2): qty 1

## 2017-08-20 MED ORDER — ASPIRIN 81 MG PO CHEW
243.0000 mg | CHEWABLE_TABLET | Freq: Once | ORAL | Status: AC
  Administered 2017-08-20: 243 mg via ORAL
  Filled 2017-08-20: qty 3

## 2017-08-20 MED ORDER — ONDANSETRON HCL 4 MG/2ML IJ SOLN
4.0000 mg | Freq: Four times a day (QID) | INTRAMUSCULAR | Status: DC | PRN
Start: 2017-08-20 — End: 2017-08-22

## 2017-08-20 MED ORDER — MORPHINE SULFATE (PF) 10 MG/ML IV SOLN
2.0000 mg | INTRAVENOUS | Status: DC | PRN
  Administered 2017-08-20: 2 mg via INTRAVENOUS

## 2017-08-20 MED ORDER — PANTOPRAZOLE SODIUM 40 MG PO TBEC
40.0000 mg | DELAYED_RELEASE_TABLET | Freq: Every day | ORAL | Status: DC
  Administered 2017-08-20 – 2017-08-22 (×3): 40 mg via ORAL
  Filled 2017-08-20 (×3): qty 1

## 2017-08-20 MED ORDER — TIROFIBAN HCL IN NACL 5-0.9 MG/100ML-% IV SOLN
0.1500 ug/kg/min | INTRAVENOUS | Status: AC
  Administered 2017-08-20 – 2017-08-21 (×2): 0.15 ug/kg/min via INTRAVENOUS
  Filled 2017-08-20 (×5): qty 100

## 2017-08-20 MED ORDER — ACETAMINOPHEN 325 MG PO TABS: 650.0000 mg | ORAL_TABLET | ORAL | Status: DC | PRN

## 2017-08-20 MED ORDER — HEPARIN (PORCINE) IN NACL 2-0.9 UNIT/ML-% IJ SOLN
INTRAMUSCULAR | Status: AC
  Filled 2017-08-20: qty 1000

## 2017-08-20 MED ORDER — LABETALOL HCL 5 MG/ML IV SOLN
10.0000 mg | INTRAVENOUS | Status: AC | PRN
  Administered 2017-08-20 (×2): 10 mg via INTRAVENOUS

## 2017-08-20 MED ORDER — HEPARIN SODIUM (PORCINE) 1000 UNIT/ML IJ SOLN
INTRAMUSCULAR | Status: AC
  Filled 2017-08-20: qty 1

## 2017-08-20 MED ORDER — MIDAZOLAM HCL 2 MG/2ML IJ SOLN
INTRAMUSCULAR | Status: AC
  Filled 2017-08-20: qty 2

## 2017-08-20 MED ORDER — CLONAZEPAM 0.5 MG PO TABS
1.0000 mg | ORAL_TABLET | Freq: Two times a day (BID) | ORAL | Status: DC | PRN
  Administered 2017-08-21 (×2): 1 mg via ORAL
  Filled 2017-08-20 (×2): qty 2

## 2017-08-20 MED ORDER — LIDOCAINE HCL (PF) 1 % IJ SOLN
INTRAMUSCULAR | Status: AC
  Filled 2017-08-20: qty 30

## 2017-08-20 MED ORDER — THE SENSUOUS HEART BOOK
Freq: Once | Status: AC
  Administered 2017-08-20: 21:00:00 1
  Filled 2017-08-20: qty 1

## 2017-08-20 MED ORDER — ANGIOPLASTY BOOK
Freq: Once | Status: AC
  Administered 2017-08-20: 1
  Filled 2017-08-20: qty 1

## 2017-08-20 MED ORDER — TICAGRELOR 90 MG PO TABS
90.0000 mg | ORAL_TABLET | Freq: Two times a day (BID) | ORAL | Status: DC
  Administered 2017-08-20 – 2017-08-22 (×4): 90 mg via ORAL
  Filled 2017-08-20 (×4): qty 1

## 2017-08-20 MED ORDER — HEPARIN (PORCINE) IN NACL 2-0.9 UNIT/ML-% IJ SOLN
INTRAMUSCULAR | Status: AC | PRN
  Administered 2017-08-20: 1000 mL

## 2017-08-20 MED ORDER — HYDROXYCHLOROQUINE SULFATE 200 MG PO TABS
200.0000 mg | ORAL_TABLET | Freq: Two times a day (BID) | ORAL | Status: DC
  Administered 2017-08-20 – 2017-08-22 (×4): 200 mg via ORAL
  Filled 2017-08-20 (×4): qty 1

## 2017-08-20 MED ORDER — ASPIRIN EC 81 MG PO TBEC
81.0000 mg | DELAYED_RELEASE_TABLET | Freq: Every day | ORAL | Status: DC
  Administered 2017-08-21 – 2017-08-22 (×2): 81 mg via ORAL
  Filled 2017-08-20 (×2): qty 1

## 2017-08-20 MED ORDER — IOPAMIDOL (ISOVUE-370) INJECTION 76%
INTRAVENOUS | Status: DC | PRN
  Administered 2017-08-20: 100 mL via INTRA_ARTERIAL

## 2017-08-20 MED ORDER — TICAGRELOR 90 MG PO TABS
ORAL_TABLET | ORAL | Status: DC | PRN
  Administered 2017-08-20: 180 mg via ORAL

## 2017-08-20 MED ORDER — HEPARIN BOLUS VIA INFUSION
5000.0000 [IU] | Freq: Once | INTRAVENOUS | Status: AC
  Administered 2017-08-20: 5000 [IU] via INTRAVENOUS
  Filled 2017-08-20: qty 5000

## 2017-08-20 MED ORDER — FENTANYL CITRATE (PF) 100 MCG/2ML IJ SOLN
INTRAMUSCULAR | Status: DC | PRN
  Administered 2017-08-20 (×2): 25 ug via INTRAVENOUS
  Administered 2017-08-20: 50 ug via INTRAVENOUS

## 2017-08-20 MED ORDER — HEPARIN SODIUM (PORCINE) 1000 UNIT/ML IJ SOLN
INTRAMUSCULAR | Status: DC | PRN
  Administered 2017-08-20: 2000 [IU] via INTRAVENOUS
  Administered 2017-08-20 (×2): 5000 [IU] via INTRAVENOUS

## 2017-08-20 MED ORDER — HEPARIN (PORCINE) IN NACL 100-0.45 UNIT/ML-% IJ SOLN
1500.0000 [IU]/h | INTRAMUSCULAR | Status: DC
  Administered 2017-08-20: 1500 [IU]/h via INTRAVENOUS
  Filled 2017-08-20 (×2): qty 250

## 2017-08-20 MED ORDER — TIROFIBAN (AGGRASTAT) BOLUS VIA INFUSION
INTRAVENOUS | Status: DC | PRN
  Administered 2017-08-20: 2435 ug via INTRAVENOUS

## 2017-08-20 MED ORDER — SODIUM CHLORIDE 0.9 % WEIGHT BASED INFUSION
1.0000 mL/kg/h | INTRAVENOUS | Status: AC
  Administered 2017-08-20: 1 mL/kg/h via INTRAVENOUS

## 2017-08-20 MED ORDER — TICAGRELOR 90 MG PO TABS
ORAL_TABLET | ORAL | Status: AC
  Filled 2017-08-20: qty 2

## 2017-08-20 SURGICAL SUPPLY — 18 items
BALLN SAPPHIRE 3.0X12 (BALLOONS) ×2
BALLOON SAPPHIRE 3.0X12 (BALLOONS) IMPLANT
CATH 5FR JL3.5 JR4 ANG PIG MP (CATHETERS) ×1 IMPLANT
CATH INFINITI 5FR AL1 (CATHETERS) ×1 IMPLANT
CATH INFINITI 5FR JL4 (CATHETERS) ×1 IMPLANT
CATH LAUNCHER 6FR EBU 4 (CATHETERS) ×1 IMPLANT
DEVICE RAD COMP TR BAND LRG (VASCULAR PRODUCTS) ×1 IMPLANT
GLIDESHEATH SLEND SS 6F .021 (SHEATH) ×1 IMPLANT
GUIDEWIRE INQWIRE 1.5J.035X260 (WIRE) IMPLANT
INQWIRE 1.5J .035X260CM (WIRE) ×2
KIT ENCORE 26 ADVANTAGE (KITS) ×1 IMPLANT
KIT HEART LEFT (KITS) ×2 IMPLANT
KIT HEMO VALVE WATCHDOG (MISCELLANEOUS) ×1 IMPLANT
PACK CARDIAC CATHETERIZATION (CUSTOM PROCEDURE TRAY) ×2 IMPLANT
STENT SYNERGY DES 3.5X16 (Permanent Stent) ×1 IMPLANT
TRANSDUCER W/STOPCOCK (MISCELLANEOUS) ×2 IMPLANT
TUBING CIL FLEX 10 FLL-RA (TUBING) ×2 IMPLANT
WIRE COUGAR XT STRL 190CM (WIRE) ×2 IMPLANT

## 2017-08-20 NOTE — ED Notes (Signed)
Patient transported to CT 

## 2017-08-20 NOTE — ED Notes (Signed)
ED Provider at bedside. 

## 2017-08-20 NOTE — Interval H&P Note (Signed)
History and Physical Interval Note:  08/20/2017 11:05 AM  [name] Michael  has presented today for surgery, with the diagnosis of cp  The various methods of treatment have been discussed with the patient and family. After consideration of risks, benefits and other options for treatment, the patient has consented to  Procedure(s): LEFT HEART CATH AND CORONARY ANGIOGRAPHY (N/A) as a surgical intervention .  The patient's history has been reviewed, patient examined, no change in status, stable for surgery.  I have reviewed the patient's chart and labs.  Questions were answered to the patient's satisfaction.     [name]

## 2017-08-20 NOTE — ED Triage Notes (Signed)
Pt states around 1230 he was woke up from his sleep with central CP, radiation to L arm and L neck. Denies SOB, n/v. Pt states that he has arthritis in his chest and he helped his daughter move over the weekend, but also has a hx of PE.

## 2017-08-20 NOTE — Consult Note (Signed)
Cardiology Consultation:   Patient ID: [name] Michael; [id number]; 1950-11-11   Admit date: 08/20/2017 Date of Consult: 08/20/2017  Primary Care Provider: Biagio Borg, MD Primary Cardiologist: No primary care provider on file.  Had previously been seen by Dr. Angelena Form Primary Electrophysiologist: None   Patient Profile:   [name] Michael is a 67 y.o. male with a hx of rheumatoid arthritis, nonobstructive coronary disease by cath in 2009, history of PE as well as hypertension, hyperlipidemia and thoracic aortic aneurysm as well as a history of a tachycardia treated with a beta-blocker who is being seen today for the evaluation of chest pain concerning for unstable angina at the request of Dr. Roxanne Mins and Dr. Lorin Mercy..  History of Present Illness:   [name] Michael is a pleasant 67 year old gentleman who previously was evaluated in 2009 with a heart catheterization showing only moderate RCA disease with a reduced EF at that time, follow-up echo showed preserved /restored EF to normal, at that time also a Myoview showed may be small lateral scar but no ischemia.  He has not followed up with cardiology since then.  He is actually done relatively well.  He states that he has baseline arthralgia and arthritis pains in his chest and shoulders, and initially thought that maybe he overdid it while helping his daughter move this past weekend.  He actually had an episode of chest discomfort that is slightly different than his arthritis pains and that it went up into his neck and both arms on Sunday.  It went away spontaneously after taking an aspirin and some Lopressor, however early this morning shortly after he went to bed he woke up with severe substernal pressure radiating that rating up to his neck/jaw and both arms.  He had it mostly going down his left arm, but some in his right as well.  This is a different symptom that he usually has from his arthritis pains.  It persisted until he went to the emergency  room, he states that with the medications that he has been treated within the emergency room, he is pain is dramatically improved, the chest aching is still present, but he is having a hard time now will noticing if this being different than his baseline aching.  He still has some discomfort in his left arm.  We were called because his troponin levels have slightly increased with a baseline level of 0.09 increasing to 0.11. He was initially being admitted by the hospitalist service, however with a second troponin being elevated, Dr. Lorin Mercy (admitting physician), and Dr. Roxanne Mins (EP) felt the best to have cardiology evaluate him as well.  Currently he looks relatively well and is almost pain-free.  Heparin is running, and he is received aspirin.  Cardiovascular ROS: positive for - chest pain, shortness of breath and Mild nausea with chest pain negative for - dyspnea on exertion, edema, irregular heartbeat, loss of consciousness, murmur, orthopnea, palpitations, paroxysmal nocturnal dyspnea, rapid heart rate or Near syncope.  TIA/amaurosis fugax   Past Medical History:  Diagnosis Date  . ALLERGIC RHINITIS 04/30/2007  . ANEMIA-NOS 07/12/2007  . ANXIETY 02/17/2009  . 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 CAD (coronary artery disease)    LHC 11/09 proximal RCA 30%, mid RCA 30%, distal RCA with serial 30-40%, EF 40-45%, normal right heart pressures.  Echo 11/10 mild LVH, EF 77-82%, grade 1 diastolic dysfunction, aortic root moderately dilated, mild LAE.;  Myoview 11/10: Small lateral scar, no ischemia, EF 61%.    . DEPRESSIVE DISORDER 02/14/2009  .  History of pulmonary embolism 03/09/2008  . HYPERLIPIDEMIA 04/27/2007  . HYPERTENSION 04/27/2007  . INSOMNIA UNSPECIFIED 02/20/2010  . 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 Nonspecific abnormal toxicological findings 03/14/2008  . OBESITY 04/30/2007  . OSTEOARTHRITIS 04/30/2007  . 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 PROSTATITIS, HX OF 04/30/2007  . RESTRICTIVE LUNG  DISEASE 06/20/2009  . THORACIC AORTIC ANEURYSM 07/06/2009   Chest CT 11/12: ascending thoracic aortic aneurysm stable at 42 mm.   Marland Kitchen UNSPECIFIED MYALGIA AND MYOSITIS 02/14/2009    Past Surgical History:  Procedure Laterality Date  . APPENDECTOMY    . BACK SURGERY    . CARDIAC CATHETERIZATION  06/2008   Non-obstructive CAD.  EF 40-45%. Anterior - apical HK  . NM MYOVIEW LTD  06/2009   Abnormal stress nuclear study with small prior lateral infarct; no ischemia. Normal EF  . TRANSTHORACIC ECHOCARDIOGRAM  06/2009   55% to 60%. Wall motion was normal.  Mod Dilated Aoi Root.  Mild LA dilation.     Home Medications:  Prior to Admission medications   Medication Sig Start Date End Date Taking? Authorizing Provider  clonazePAM (KLONOPIN) 1 MG tablet TAKE 1 TABLET BY MOUTH TWICE A DAY IF NEEDED FOR ANXIETY  08/06/17  Yes Biagio Borg, MD  hydroxychloroquine (PLAQUENIL) 200 MG tablet Take 200 mg by mouth 2 (two) times daily.    Yes [provider]  leflunomide (ARAVA) 20 MG tablet Take 20 mg by mouth daily.     Yes [provider]  losartan (COZAAR) 100 MG tablet Take 1 tablet (100 mg total) by mouth daily. 04/03/17  Yes Biagio Borg, MD  metoprolol tartrate (LOPRESSOR) 25 MG tablet TAKE 1 TABLET BY MOUTH TWICE A DAY 06/19/17  Yes Biagio Borg, MD  omeprazole (PRILOSEC) 20 MG capsule TAKE ONE CAPSULE BY MOUTH TWICE DAILY  08/06/17  Yes Biagio Borg, MD  zolpidem (AMBIEN CR) 12.5 MG CR tablet TAKE ONE TABLET BY MOUTH AT BEDTIME AS NEEDED  07/04/17  Yes Biagio Borg, MD    Inpatient Medications: Scheduled Meds: . [START ON 08/21/2017] aspirin EC  81 mg Oral Daily  . hydroxychloroquine  200 mg Oral BID  . [START ON 08/21/2017] losartan  100 mg Oral Daily  . metoprolol tartrate  25 mg Oral BID  . pantoprazole  40 mg Oral Daily  . pravastatin  40 mg Oral q1800  . sodium chloride flush  3 mL Intravenous Q12H  . sodium chloride flush  3 mL Intravenous Q12H   Continuous  Infusions: . sodium chloride    . sodium chloride    . [START ON 08/21/2017] sodium chloride     Followed by  . [START ON 08/21/2017] sodium chloride    . heparin 1,500 Units/hr (08/20/17 0453)   PRN Meds: sodium chloride, sodium chloride, acetaminophen, clonazePAM, hydrALAZINE, nitroGLYCERIN, ondansetron (ZOFRAN) IV, sodium chloride flush, sodium chloride flush, zolpidem  Allergies:    Allergies  Allergen Reactions  . Atorvastatin     REACTION: myalgias  . Ciprofloxacin Other (See Comments)    myalgias  . Crestor [Rosuvastatin]     myalgias  . Doxycycline   . Hydroxychloroquine Sulfate     REACTION: GI upset and H, pt is currently taking and still has some GI problems but is tolerating it    Social History:   Social History   Socioeconomic History  . Marital status: Married    Spouse name: Not on file  . Number of children: Not on file  .  Years of education: Not on file  . Highest education level: Not on file  Social Needs  . Financial resource strain: Not on file  . Food insecurity - worry: Not on file  . Food insecurity - inability: Not on file  . Transportation needs - medical: Not on file  . Transportation needs - non-medical: Not on file  Occupational History  . Not on file  Tobacco Use  . Smoking status: Never Smoker  . Smokeless tobacco: Never Used  Substance and Sexual Activity  . Alcohol use: No  . Drug use: No  . Sexual activity: Not on file  Other Topics Concern  . Not on file  Social History Narrative  . Not on file    Family History:    Family History  Problem Relation Age of Onset  . Diabetes Mother   . Heart disease Mother   . Hyperlipidemia Mother   . Stroke Mother   . Diabetes Father   . Heart disease Father   . Hyperlipidemia Father   . Stroke Father   . Hypertension Sister   . Diabetes Brother   . Diabetes Brother   . Heart disease Brother   . Hypertension Brother   . Alcohol abuse Other   . Diabetes Other   . Hypertension  Other   . Heart disease Other   . Stroke Other      ROS:  Please see the history of present illness.  Review of Systems  Constitutional: Negative for malaise/fatigue and weight loss.  HENT: Negative for congestion and nosebleeds.   Respiratory: Positive for cough (Intermittent). Negative for sputum production, shortness of breath and wheezing.   Cardiovascular: Positive for chest pain. Negative for palpitations (None since starting beta-blocker).  Gastrointestinal: Positive for abdominal pain (Off and on over the last 2 weeks), diarrhea (Intermittent loose stools because of Plaquenil) and nausea (On Sunday when he had chest pain). Negative for blood in stool and melena.  Genitourinary: Negative for hematuria.  Musculoskeletal: Positive for joint pain.       He has baseline arthritis pains in his shoulders some in his chest as well as leg joints from rheumatoid arthritis.  Neurological: Negative for dizziness.  Psychiatric/Behavioral: Negative.   All other systems reviewed and are negative.    Physical Exam/Data:   Vitals:   08/20/17 0745 08/20/17 0815 08/20/17 0924 08/20/17 0930  BP: (!) 124/94 129/90  (!) 125/92  Pulse: 74 77  81  Resp: 11 17  14   Temp:      TempSrc:      SpO2: 96% 96%  96%  Weight:   214 lb 12.8 oz (97.4 kg)   Height:       No intake or output data in the 24 hours ending 08/20/17 1009 Filed Weights   08/20/17 0147 08/20/17 0924  Weight: 205 lb (93 kg) 214 lb 12.8 oz (97.4 kg)   Body mass index is 29.13 kg/m.  General:  Well nourished, well developed, in mild amount of distress.  But not acute.  Otherwise healthy appearing HEENT: Maybeury/AT/EOMI. Lymph: no adenopathy Neck: no JVD or bruit Endocrine:  No thryomegaly Vascular: No carotid bruits; FA pulses 2+ bilaterally without bruits  Cardiac:  normal S1, S2; RRR; no M/R/G Lungs:  clear to auscultation bilaterally, no wheezing, rhonchi or rales  Abd: soft, nontender, no hepatomegaly  Ext: no  edema Musculoskeletal:  No deformities, BUE and BLE strength normal and equal Skin: warm and dry  Neuro:  CNs 2-12 intact,  no focal abnormalities noted Psych:  Normal affect   EKG:  The EKG was personally reviewed and demonstrates: Sinus rhythm with RBBB.  LVH.  Probable inferior infarct, age undetermined.  RBBB seems to be new (previously incomplete RBBB), but otherwise stable. Telemetry:  Telemetry was personally reviewed and demonstrates: Sinus rhythm  Relevant CV Studies: See PMH/PSH  Laboratory Data:  Chemistry Recent Labs  Lab 08/20/17 0157  NA 138  K 4.4  CL 103  CO2 23  GLUCOSE 132*  BUN 16  CREATININE 1.46*  CALCIUM 9.4  GFRNONAA 49*  GFRAA 57*  ANIONGAP 12    No results for input(s): PROT, ALBUMIN, AST, ALT, ALKPHOS, BILITOT in the last 168 hours. Hematology Recent Labs  Lab 08/20/17 0157  WBC 8.7  RBC 4.50  HGB 13.2  HCT 40.5  MCV 90.0  MCH 29.3  MCHC 32.6  RDW 12.8  PLT 292   Cardiac EnzymesNo results for input(s): TROPONINI in the last 168 hours.  Recent Labs  Lab 08/20/17 0237 08/20/17 0525  TROPIPOC 0.09* 0.11*    BNPNo results for input(s): BNP, PROBNP in the last 168 hours.  DDimer No results for input(s): DDIMER in the last 168 hours.  Radiology/Studies:  Dg Chest 2 View  Result Date: 08/20/2017 CLINICAL DATA:  Chest pain EXAM: CHEST  2 VIEW COMPARISON:  06/02/2009 chest radiograph. FINDINGS: Stable cardiomediastinal silhouette with normal heart size. No pneumothorax. No pleural effusion. Faint reticular opacities at both lung bases, not appreciably changed. No pulmonary edema. No acute consolidative airspace disease. IMPRESSION: Chronic faint reticular opacities at both lung bases compatible with nonspecific mild scarring. No acute cardiopulmonary disease. Electronically Signed   By: Ilona Sorrel M.D.   On: 08/20/2017 02:51   Ct Angio Chest Pe W And/or Wo Contrast  Result Date: 08/20/2017 CLINICAL DATA:  67 year old male with central  chest pain EXAM: CT ANGIOGRAPHY CHEST WITH CONTRAST TECHNIQUE: Multidetector CT imaging of the chest was performed using the standard protocol during bolus administration of intravenous contrast. Multiplanar CT image reconstructions and MIPs were obtained to evaluate the vascular anatomy. CONTRAST:  19mL ISOVUE-370 IOPAMIDOL (ISOVUE-370) INJECTION 76% COMPARISON:  Chest radiograph dated 08/20/2017 FINDINGS: Cardiovascular: There is mild cardiomegaly. No pericardial effusion. There is multi vessel coronary vascular calcification. Mildly dilated ascending aorta measures 4.6 cm in diameter. Evaluation of the aorta is limited due to suboptimal enhancement and timing of the contrast. There is no CT evidence of pulmonary embolism. Mediastinum/Nodes: No hilar or mediastinal adenopathy. Esophagus and the thyroid gland are grossly unremarkable. No mediastinal fluid collection. Lungs/Pleura: A 3 mm subpleural calcified granuloma in the lingula. The lungs are otherwise clear. There is no pleural effusion or pneumothorax. The central airways are patent. Upper Abdomen: Apparent mild fatty liver. The visualized upper abdomen is otherwise unremarkable. Musculoskeletal: No acute osseous pathology. Review of the MIP images confirms the above findings. IMPRESSION: 1. No acute intrathoracic pathology. No CT evidence of pulmonary embolism. 2. Mild cardiomegaly and multi vessel coronary vascular calcification. 3. Mildly dilated ascending aorta measuring 4.6 cm in diameter. Ascending thoracic aortic aneurysm. Recommend semi-annual imaging followup by CTA or MRA and referral to cardiothoracic surgery if not already obtained. This recommendation follows 2010 ACCF/AHA/AATS/ACR/ASA/SCA/SCAI/SIR/STS/SVM Guidelines for the Diagnosis and Management of Patients With Thoracic Aortic Disease. Circulation. 2010; 121: E703-J009 Electronically Signed   By: Anner Crete M.D.   On: 08/20/2017 05:51    Assessment and Plan:   Principal  Problem:   Chest pain with high risk of acute coronary  syndrome Active Problems:   Coronary artery disease, non-occlusive   Hyperlipidemia   OBESITY   Essential hypertension   Aneurysm of thoracic aorta (HCC)   Rheumatoid arthritis (HCC)   ACS (acute coronary syndrome) (HCC)   Hyperglycemia   AAA (abdominal aortic aneurysm) ([place])  67 year old gentleman with chronic inflammatory condition, thoracic aortic aneurysm (suggestive of atherosclerotic disease, hypertension, hyperlipidemia who presents with signs and symptoms concerning for unstable angina/acute coronary syndrome.  He had mild troponin elevation which would corroborate this diagnosis.  He has been placed on IV heparin and given aspirin. --Would give him a dose of metoprolol this morning, if he has more symptoms, would treat with nitroglycerin. --Continue IV heparin --We will plan cardiac catheterization today to delineate his anatomy and exclude ischemic disease. I discussed the risk, benefits, alternatives and indications with the patient in great detail.  All questions were answered.    Risks / Complications include, but not limited to: Death, MI, CVA/TIA, VF/VT (with defibrillation), Bradycardia (need for temporary pacer placement), contrast induced nephropathy, bleeding / bruising / hematoma / pseudoaneurysm, vascular or coronary injury (with possible emergent CT or Vascular Surgery), adverse medication reactions, infection.  Additional risks involving the use of radiation with the possibility of radiation burns and cancer were explained in detail.  The patient (and family) voice understanding and agree to proceed.     Would continue his home antihypertensive agents losartan and beta-blocker.  Convert from pravastatin to high-dose atorvastatin pending catheter results  We will defer management of arthritic pains and rheumatoid arthritis to hospitalist service    For questions or updates, please contact [place] Please consult [email or web address] for contact info under Cardiology/STEMI.   Signed, Glenetta Hew, MD  08/20/2017 10:09 AM

## 2017-08-20 NOTE — Progress Notes (Signed)
TR BAND REMOVAL  LOCATION:  right radial  DEFLATED PER PROTOCOL:  Yes.    TIME BAND OFF / DRESSING APPLIED:   1645   SITE UPON ARRIVAL:   Level 0  SITE AFTER BAND REMOVAL:  Level 0  CIRCULATION SENSATION AND MOVEMENT:  Within Normal Limits  Yes.    COMMENTS:  Sight ooze.

## 2017-08-20 NOTE — H&P (View-Only) (Signed)
Cardiology Consultation:   Patient ID: [name] Michael; [id number]; 01/17/1951   Admit date: 08/20/2017 Date of Consult: 08/20/2017  Primary Care Provider: Biagio Borg, MD Primary Cardiologist: No primary care provider on file.  Had previously been seen by Dr. Angelena Michael Primary Electrophysiologist: None   Patient Profile:   [name] Michael is a 67 y.o. male with a hx of rheumatoid arthritis, nonobstructive coronary disease by cath in 2009, history of PE as well as hypertension, hyperlipidemia and thoracic aortic aneurysm as well as a history of a tachycardia treated with a beta-blocker who is being seen today for the evaluation of chest pain concerning for unstable angina at the request of Dr. Roxanne Michael and Dr. Lorin Michael..  History of Present Illness:   [name] Michael is a pleasant 67 year old gentleman who previously was evaluated in 2009 with a heart catheterization showing only moderate RCA disease with a reduced EF at that time, follow-up echo showed preserved /restored EF to normal, at that time also a Myoview showed may be small lateral scar but no ischemia.  He has not followed up with cardiology since then.  He is actually done relatively well.  He states that he has baseline arthralgia and arthritis pains in his chest and shoulders, and initially thought that maybe he overdid it while helping his daughter move this past weekend.  He actually had an episode of chest discomfort that is slightly different than his arthritis pains and that it went up into his neck and both arms on Sunday.  It went away spontaneously after taking an aspirin and some Lopressor, however early this morning shortly after he went to bed he woke up with severe substernal pressure radiating that rating up to his neck/jaw and both arms.  He had it mostly going down his left arm, but some in his right as well.  This is a different symptom that he usually has from his arthritis pains.  It persisted until he went to the emergency  room, he states that with the medications that he has been treated within the emergency room, he is pain is dramatically improved, the chest aching is still present, but he is having a hard time now will noticing if this being different than his baseline aching.  He still has some discomfort in his left arm.  We were called because his troponin levels have slightly increased with a baseline level of 0.09 increasing to 0.11. He was initially being admitted by the hospitalist service, however with a second troponin being elevated, Dr. Lorin Michael (admitting physician), and Dr. Roxanne Michael (EP) felt the best to have cardiology evaluate him as well.  Currently he looks relatively well and is almost pain-free.  Heparin is running, and he is received aspirin.  Cardiovascular ROS: positive for - chest pain, shortness of breath and Mild nausea with chest pain negative for - dyspnea on exertion, edema, irregular heartbeat, loss of consciousness, murmur, orthopnea, palpitations, paroxysmal nocturnal dyspnea, rapid heart rate or Near syncope.  TIA/amaurosis fugax   Past Medical History:  Diagnosis Date  . ALLERGIC RHINITIS 04/30/2007  . ANEMIA-NOS 07/12/2007  . ANXIETY 02/17/2009  . 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 CAD (coronary artery disease)    LHC 11/09 proximal RCA 30%, mid RCA 30%, distal RCA with serial 30-40%, EF 40-45%, normal right heart pressures.  Echo 11/10 mild LVH, EF 40-98%, grade 1 diastolic dysfunction, aortic root moderately dilated, mild LAE.;  Myoview 11/10: Small lateral scar, no ischemia, EF 61%.    . DEPRESSIVE DISORDER 02/14/2009  .  History of pulmonary embolism 03/09/2008  . HYPERLIPIDEMIA 04/27/2007  . HYPERTENSION 04/27/2007  . INSOMNIA UNSPECIFIED 02/20/2010  . 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 Nonspecific abnormal toxicological findings 03/14/2008  . OBESITY 04/30/2007  . OSTEOARTHRITIS 04/30/2007  . 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 PROSTATITIS, HX OF 04/30/2007  . RESTRICTIVE LUNG  DISEASE 06/20/2009  . THORACIC AORTIC ANEURYSM 07/06/2009   Chest CT 11/12: ascending thoracic aortic aneurysm stable at 42 mm.   Marland Kitchen UNSPECIFIED MYALGIA AND MYOSITIS 02/14/2009    Past Surgical History:  Procedure Laterality Date  . APPENDECTOMY    . BACK SURGERY    . CARDIAC CATHETERIZATION  06/2008   Non-obstructive CAD.  EF 40-45%. Anterior - apical HK  . NM MYOVIEW LTD  06/2009   Abnormal stress nuclear study with small prior lateral infarct; no ischemia. Normal EF  . TRANSTHORACIC ECHOCARDIOGRAM  06/2009   55% to 60%. Wall motion was normal.  Mod Dilated Aoi Root.  Mild LA dilation.     Home Medications:  Prior to Admission medications   Medication Sig Start Date End Date Taking? Authorizing Provider  clonazePAM (KLONOPIN) 1 MG tablet TAKE 1 TABLET BY MOUTH TWICE A DAY IF NEEDED FOR ANXIETY  08/06/17  Yes [name] Borg, MD  hydroxychloroquine (PLAQUENIL) 200 MG tablet Take 200 mg by mouth 2 (two) times daily.    Yes [provider]  leflunomide (ARAVA) 20 MG tablet Take 20 mg by mouth daily.     Yes [provider]  losartan (COZAAR) 100 MG tablet Take 1 tablet (100 mg total) by mouth daily. 04/03/17  Yes [name] Borg, MD  metoprolol tartrate (LOPRESSOR) 25 MG tablet TAKE 1 TABLET BY MOUTH TWICE A DAY 06/19/17  Yes [name] Borg, MD  omeprazole (PRILOSEC) 20 MG capsule TAKE ONE CAPSULE BY MOUTH TWICE DAILY  08/06/17  Yes [name] Borg, MD  zolpidem (AMBIEN CR) 12.5 MG CR tablet TAKE ONE TABLET BY MOUTH AT BEDTIME AS NEEDED  07/04/17  Yes [name] Borg, MD    Inpatient Medications: Scheduled Meds: . [START ON 08/21/2017] aspirin EC  81 mg Oral Daily  . hydroxychloroquine  200 mg Oral BID  . [START ON 08/21/2017] losartan  100 mg Oral Daily  . metoprolol tartrate  25 mg Oral BID  . pantoprazole  40 mg Oral Daily  . pravastatin  40 mg Oral q1800  . sodium chloride flush  3 mL Intravenous Q12H  . sodium chloride flush  3 mL Intravenous Q12H   Continuous  Infusions: . sodium chloride    . sodium chloride    . [START ON 08/21/2017] sodium chloride     Followed by  . [START ON 08/21/2017] sodium chloride    . heparin 1,500 Units/hr (08/20/17 0453)   PRN Meds: sodium chloride, sodium chloride, acetaminophen, clonazePAM, hydrALAZINE, nitroGLYCERIN, ondansetron (ZOFRAN) IV, sodium chloride flush, sodium chloride flush, zolpidem  Allergies:    Allergies  Allergen Reactions  . Atorvastatin     REACTION: myalgias  . Ciprofloxacin Other (See Comments)    myalgias  . Crestor [Rosuvastatin]     myalgias  . Doxycycline   . Hydroxychloroquine Sulfate     REACTION: GI upset and H, pt is currently taking and still has some GI problems but is tolerating it    Social History:   Social History   Socioeconomic History  . Marital status: Married    Spouse name: Not on file  . Number of children: Not on file  .  Years of education: Not on file  . Highest education level: Not on file  Social Needs  . Financial resource strain: Not on file  . Food insecurity - worry: Not on file  . Food insecurity - inability: Not on file  . Transportation needs - medical: Not on file  . Transportation needs - non-medical: Not on file  Occupational History  . Not on file  Tobacco Use  . Smoking status: Never Smoker  . Smokeless tobacco: Never Used  Substance and Sexual Activity  . Alcohol use: No  . Drug use: No  . Sexual activity: Not on file  Other Topics Concern  . Not on file  Social History Narrative  . Not on file    Family History:    Family History  Problem Relation Age of Onset  . Diabetes Mother   . Heart disease Mother   . Hyperlipidemia Mother   . Stroke Mother   . Diabetes Father   . Heart disease Father   . Hyperlipidemia Father   . Stroke Father   . Hypertension Sister   . Diabetes Brother   . Diabetes Brother   . Heart disease Brother   . Hypertension Brother   . Alcohol abuse Other   . Diabetes Other   . Hypertension  Other   . Heart disease Other   . Stroke Other      ROS:  Please see the history of present illness.  Review of Systems  Constitutional: Negative for malaise/fatigue and weight loss.  HENT: Negative for congestion and nosebleeds.   Respiratory: Positive for cough (Intermittent). Negative for sputum production, shortness of breath and wheezing.   Cardiovascular: Positive for chest pain. Negative for palpitations (None since starting beta-blocker).  Gastrointestinal: Positive for abdominal pain (Off and on over the last 2 weeks), diarrhea (Intermittent loose stools because of Plaquenil) and nausea (On Sunday when he had chest pain). Negative for blood in stool and melena.  Genitourinary: Negative for hematuria.  Musculoskeletal: Positive for joint pain.       He has baseline arthritis pains in his shoulders some in his chest as well as leg joints from rheumatoid arthritis.  Neurological: Negative for dizziness.  Psychiatric/Behavioral: Negative.   All other systems reviewed and are negative.    Physical Exam/Data:   Vitals:   08/20/17 0745 08/20/17 0815 08/20/17 0924 08/20/17 0930  BP: (!) 124/94 129/90  (!) 125/92  Pulse: 74 77  81  Resp: 11 17  14   Temp:      TempSrc:      SpO2: 96% 96%  96%  Weight:   214 lb 12.8 oz (97.4 kg)   Height:       No intake or output data in the 24 hours ending 08/20/17 1009 Filed Weights   08/20/17 0147 08/20/17 0924  Weight: 205 lb (93 kg) 214 lb 12.8 oz (97.4 kg)   Body mass index is 29.13 kg/m.  General:  Well nourished, well developed, in mild amount of distress.  But not acute.  Otherwise healthy appearing HEENT: [place]/AT/EOMI. Lymph: no adenopathy Neck: no JVD or bruit Endocrine:  No thryomegaly Vascular: No carotid bruits; FA pulses 2+ bilaterally without bruits  Cardiac:  normal S1, S2; RRR; no M/R/G Lungs:  clear to auscultation bilaterally, no wheezing, rhonchi or rales  Abd: soft, nontender, no hepatomegaly  Ext: no  edema Musculoskeletal:  No deformities, BUE and BLE strength normal and equal Skin: warm and dry  Neuro:  CNs 2-12 intact,  no focal abnormalities noted Psych:  Normal affect   EKG:  The EKG was personally reviewed and demonstrates: Sinus rhythm with RBBB.  LVH.  Probable inferior infarct, age undetermined.  RBBB seems to be new (previously incomplete RBBB), but otherwise stable. Telemetry:  Telemetry was personally reviewed and demonstrates: Sinus rhythm  Relevant CV Studies: See PMH/PSH  Laboratory Data:  Chemistry Recent Labs  Lab 08/20/17 0157  NA 138  K 4.4  CL 103  CO2 23  GLUCOSE 132*  BUN 16  CREATININE 1.46*  CALCIUM 9.4  GFRNONAA 49*  GFRAA 57*  ANIONGAP 12    No results for input(s): PROT, ALBUMIN, AST, ALT, ALKPHOS, BILITOT in the last 168 hours. Hematology Recent Labs  Lab 08/20/17 0157  WBC 8.7  RBC 4.50  HGB 13.2  HCT 40.5  MCV 90.0  MCH 29.3  MCHC 32.6  RDW 12.8  PLT 292   Cardiac EnzymesNo results for input(s): TROPONINI in the last 168 hours.  Recent Labs  Lab 08/20/17 0237 08/20/17 0525  TROPIPOC 0.09* 0.11*    BNPNo results for input(s): BNP, PROBNP in the last 168 hours.  DDimer No results for input(s): DDIMER in the last 168 hours.  Radiology/Studies:  Dg Chest 2 View  Result Date: 08/20/2017 CLINICAL DATA:  Chest pain EXAM: CHEST  2 VIEW COMPARISON:  06/02/2009 chest radiograph. FINDINGS: Stable cardiomediastinal silhouette with normal heart size. No pneumothorax. No pleural effusion. Faint reticular opacities at both lung bases, not appreciably changed. No pulmonary edema. No acute consolidative airspace disease. IMPRESSION: Chronic faint reticular opacities at both lung bases compatible with nonspecific mild scarring. No acute cardiopulmonary disease. Electronically Signed   By: Ilona Sorrel M.D.   On: 08/20/2017 02:51   Ct Angio Chest Pe W And/or Wo Contrast  Result Date: 08/20/2017 CLINICAL DATA:  67 year old male with central  chest pain EXAM: CT ANGIOGRAPHY CHEST WITH CONTRAST TECHNIQUE: Multidetector CT imaging of the chest was performed using the standard protocol during bolus administration of intravenous contrast. Multiplanar CT image reconstructions and MIPs were obtained to evaluate the vascular anatomy. CONTRAST:  136mL ISOVUE-370 IOPAMIDOL (ISOVUE-370) INJECTION 76% COMPARISON:  Chest radiograph dated 08/20/2017 FINDINGS: Cardiovascular: There is mild cardiomegaly. No pericardial effusion. There is multi vessel coronary vascular calcification. Mildly dilated ascending aorta measures 4.6 cm in diameter. Evaluation of the aorta is limited due to suboptimal enhancement and timing of the contrast. There is no CT evidence of pulmonary embolism. Mediastinum/Nodes: No hilar or mediastinal adenopathy. Esophagus and the thyroid gland are grossly unremarkable. No mediastinal fluid collection. Lungs/Pleura: A 3 mm subpleural calcified granuloma in the lingula. The lungs are otherwise clear. There is no pleural effusion or pneumothorax. The central airways are patent. Upper Abdomen: Apparent mild fatty liver. The visualized upper abdomen is otherwise unremarkable. Musculoskeletal: No acute osseous pathology. Review of the MIP images confirms the above findings. IMPRESSION: 1. No acute intrathoracic pathology. No CT evidence of pulmonary embolism. 2. Mild cardiomegaly and multi vessel coronary vascular calcification. 3. Mildly dilated ascending aorta measuring 4.6 cm in diameter. Ascending thoracic aortic aneurysm. Recommend semi-annual imaging followup by CTA or MRA and referral to cardiothoracic surgery if not already obtained. This recommendation follows 2010 ACCF/AHA/AATS/ACR/ASA/SCA/SCAI/SIR/STS/SVM Guidelines for the Diagnosis and Management of Patients With Thoracic Aortic Disease. Circulation. 2010; 121: Q676-P950 Electronically Signed   By: Anner Crete M.D.   On: 08/20/2017 05:51    Assessment and Plan:   Principal  Problem:   Chest pain with high risk of acute coronary  syndrome Active Problems:   Coronary artery disease, non-occlusive   Hyperlipidemia   OBESITY   Essential hypertension   Aneurysm of thoracic aorta (HCC)   Rheumatoid arthritis (HCC)   ACS (acute coronary syndrome) (HCC)   Hyperglycemia   AAA (abdominal aortic aneurysm) ([place])  67 year old gentleman with chronic inflammatory condition, thoracic aortic aneurysm (suggestive of atherosclerotic disease, hypertension, hyperlipidemia who presents with signs and symptoms concerning for unstable angina/acute coronary syndrome.  He had mild troponin elevation which would corroborate this diagnosis.  He has been placed on IV heparin and given aspirin. --Would give him a dose of metoprolol this morning, if he has more symptoms, would treat with nitroglycerin. --Continue IV heparin --We will plan cardiac catheterization today to delineate his anatomy and exclude ischemic disease. I discussed the risk, benefits, alternatives and indications with the patient in great detail.  All questions were answered.    Risks / Complications include, but not limited to: Death, MI, CVA/TIA, VF/VT (with defibrillation), Bradycardia (need for temporary pacer placement), contrast induced nephropathy, bleeding / bruising / hematoma / pseudoaneurysm, vascular or coronary injury (with possible emergent CT or Vascular Surgery), adverse medication reactions, infection.  Additional risks involving the use of radiation with the possibility of radiation burns and cancer were explained in detail.  The patient (and family) voice understanding and agree to proceed.     Would continue his home antihypertensive agents losartan and beta-blocker.  Convert from pravastatin to high-dose atorvastatin pending catheter results  We will defer management of arthritic pains and rheumatoid arthritis to hospitalist service    For questions or updates, please contact [place] Please consult [email or web address] for contact info under Cardiology/STEMI.   Signed, Glenetta Hew, MD  08/20/2017 10:09 AM

## 2017-08-20 NOTE — Care Management Note (Addendum)
Case Management Note  Patient Details  Name: Chey Cho MRN: [id number] Date of Birth: 11-05-1950  Subjective/Objective:     From home, pta indep, s/p coronary stent intervention, will be on brilinta, he does not have any insurance , NCM scheduled hospital follow up for 1/21 at 2 pm at the [facility].  He will need a 30 day free coupon for the brilinta, an patient asst application and brochure for the [facility] clinic. NCM will give this to him on 1/17.    1/17 [place], BSN - NCM gave patient the 30 day savings coupon, the patient ast application and brochure for [facility] clinic.  Informed him also that Cardiologist have samples in the office.  NCM informed patient that clinic is open Mon- Fri only.  He will need paper scripts or electronically send to [place] [facility].              Action/Plan: NCM will follow for dc needs.   Expected Discharge Date:                  Expected Discharge Plan:  Home/Self Care  In-House Referral:     Discharge planning Services  CM Consult, Medication Assistance, [facility]  Post Acute Care Choice:    Choice offered to:     DME Arranged:    DME Agency:     HH Arranged:    HH Agency:     Status of Service:  In process, will continue to follow  If discussed at Long Length of Stay Meetings, dates discussed:    Additional Comments:  Zenon Mayo, RN 08/20/2017, 3:35 PM

## 2017-08-20 NOTE — ED Provider Notes (Signed)
[facility] DEPART[facility] Provider Note   CSN: [id number] Arrival date & time: 08/20/17  0140     History   Chief Complaint Chief Complaint  Patient presents with  . Chest Pain    HPI [name] Michael is a 67 y.o. male.  The history is provided by the patient.  He has a history of rheumatoid arthritis, peptic ulcer disease, hypertension, hyperlipidemia and nonobstructive coronary artery disease.  He was awakened at 12:15 AM with midsternal chest pain which she describes as dull and achy.  Pain radiated to both arms.  There is associated nausea but no dyspnea or diaphoresis.  Pain was initially rated at 8/10.  It has subsided but is still present and he rates it currently at 2/10.  He had a similar pain 2 days ago which came on at rest, but had occurred the day following his doing significant amount of lifting.  That episode was associated with diaphoresis as well.  He took 81 mg aspirin tonight.  He has had a heart catheterization in the past and had followed up with cardiology, but has not seen them for several years.  He also has a history of pulmonary embolism.  He is a non-smoker without history of diabetes and there is no family history of premature coronary atherosclerosis.  Past Medical History:  Diagnosis Date  . ALLERGIC RHINITIS 04/30/2007  . ANEMIA-NOS 07/12/2007  . ANXIETY 02/17/2009  . 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 CAD (coronary artery disease)    LHC 11/09 proximal RCA 30%, mid RCA 30%, distal RCA with serial 30-40%, EF 40-45%, normal right heart pressures.  Echo 11/10 mild LVH, EF 62-69%, grade 1 diastolic dysfunction, aortic root moderately dilated, mild LAE.;  Myoview 11/10: Small lateral scar, no ischemia, EF 61%.    . DEPRESSIVE DISORDER 02/14/2009  . History of pulmonary embolism 03/09/2008  . HYPERLIPIDEMIA 04/27/2007  . HYPERTENSION 04/27/2007  . INSOMNIA UNSPECIFIED 02/20/2010  . 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 Nonspecific abnormal  toxicological findings 03/14/2008  . OBESITY 04/30/2007  . OSTEOARTHRITIS 04/30/2007  . 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 PROSTATITIS, HX OF 04/30/2007  . RESTRICTIVE LUNG DISEASE 06/20/2009  . THORACIC AORTIC ANEURYSM 07/06/2009   Chest CT 11/12: ascending thoracic aortic aneurysm stable at 42 mm.   Marland Kitchen UNSPECIFIED MYALGIA AND MYOSITIS 02/14/2009    Patient Active Problem List   Diagnosis Date Noted  . Left leg paresthesias 04/03/2017  . Erectile dysfunction 04/03/2017  . Acute prostatitis 05/28/2016  . Cervical stenosis of spine 10/26/2015  . Migraine 10/26/2015  . Migraine without aura, not intractable 10/26/2015  . Acute upper respiratory infection 05/08/2014  . BPH (benign prostatic hypertrophy) 06/20/2013  . Rash and nonspecific skin eruption 01/26/2013  . Orchitis 01/02/2013  . Tick bite 01/02/2013  . Right anterior knee pain 01/02/2013  . Fatigue 10/03/2011  . CAD (coronary artery disease) 05/10/2011  . Encounter for well adult exam with abnormal findings 03/24/2011  . INSOMNIA UNSPECIFIED 02/20/2010  . 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 RESTRICTIVE LUNG DISEASE 06/20/2009  . CHEST PAIN 06/02/2009  . ANXIETY 02/17/2009  . 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 DEPRESSIVE DISORDER 02/14/2009  . UNSPECIFIED MYALGIA AND MYOSITIS 02/14/2009  . Nonspecific abnormal toxicological findings 03/14/2008  . PE 03/09/2008  . POLYARTHRITIS, INFLAMMATORY 12/07/2007  . ANEMIA-NOS 07/12/2007  . 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 OBESITY 04/30/2007  . Allergic rhinitis 04/30/2007  . GERD 04/30/2007  . OSTEOARTHRITIS 04/30/2007  . PROSTATITIS, HX OF 04/30/2007  . Hyperlipidemia  04/27/2007  . Essential hypertension 04/27/2007  . 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 APPENDECTOMY    . BACK SURGERY         Home Medications    Prior to Admission medications   Medication Sig Start Date End Date Taking? Authorizing Provider   aspirin 81 MG tablet Take 81 mg by mouth daily. Reported on 12/25/2015    [provider]  celecoxib (CELEBREX) 200 MG capsule take 1 capsule by mouth twice a day if needed 06/03/16   Biagio Borg, MD  Cholecalciferol (VITAMIN D) 2000 UNITS CAPS Take by mouth daily. Reported on 12/25/2015    [provider]  clonazePAM (KLONOPIN) 1 MG tablet TAKE 1 TABLET BY MOUTH TWICE A DAY IF NEEDED FOR ANXIETY  08/06/17   Biagio Borg, MD  ezetimibe (ZETIA) 10 MG tablet Take 1 tablet (10 mg total) by mouth daily. 05/28/16   Biagio Borg, MD  folic acid (FOLVITE) 782 MCG tablet Take by mouth 2 (two) times daily.     [provider]  Glucos-Chond-Hyal Ac-Ca Fructo (MOVE FREE JOINT HEALTH ADVANCE) TABS Take 1 tablet by mouth daily. Reported on 12/25/2015    [provider]  hydroxychloroquine (PLAQUENIL) 200 MG tablet Take 200 mg by mouth 2 (two) times daily.     [provider]  leflunomide (ARAVA) 20 MG tablet Take 20 mg by mouth daily.      [provider]  losartan (COZAAR) 100 MG tablet Take 1 tablet (100 mg total) by mouth daily. 04/03/17   Biagio Borg, MD  metoprolol tartrate (LOPRESSOR) 25 MG tablet TAKE 1 TABLET BY MOUTH TWICE A DAY 06/19/17   Biagio Borg, MD  Omega-3 Fatty Acids (FISH OIL) 1200 MG CAPS Take by mouth daily.      [provider]  omeprazole (PRILOSEC) 20 MG capsule TAKE ONE CAPSULE BY MOUTH TWICE DAILY  08/06/17   Biagio Borg, MD  sildenafil (VIAGRA) 100 MG tablet Take 0.5-1 tablets (50-100 mg total) by mouth daily as needed for erectile dysfunction. 04/03/17   Biagio Borg, MD  Specialty Vitamins Products (ONE-A-DAY ENERGY FORMULA) TABS Take by mouth daily. Reported on 12/25/2015    [provider]  sulfamethoxazole-trimethoprim (BACTRIM DS) 800-160 MG tablet Take 1 tablet by mouth 2 (two) times daily. 06/07/16   Biagio Borg, MD  tamsulosin (FLOMAX) 0.4 MG CAPS capsule Take 1 capsule (0.4 mg total) by mouth daily.  07/08/16   Biagio Borg, MD  zolpidem (AMBIEN CR) 12.5 MG CR tablet TAKE ONE TABLET BY MOUTH AT BEDTIME AS NEEDED  07/04/17   Biagio Borg, MD    Family History Family History  Problem Relation Age of Onset  . Diabetes Mother   . Heart disease Mother   . Hyperlipidemia Mother   . Stroke Mother   . Diabetes Father   . Heart disease Father   . Hyperlipidemia Father   . Stroke Father   . Hypertension Sister   . Diabetes Brother   . Diabetes Brother   . Heart disease Brother   . Hypertension Brother   . Alcohol abuse Other   . Diabetes Other   . Hypertension Other   . Heart disease Other   . Stroke Other     Social History Social History   Tobacco Use  . Smoking status: Never Smoker  . Smokeless tobacco: Never Used  Substance Use Topics  .  Alcohol use: No  . Drug use: No     Allergies   Atorvastatin; Ciprofloxacin; Crestor [rosuvastatin]; Doxycycline; and Hydroxychloroquine sulfate   Review of Systems Review of Systems  All other systems reviewed and are negative.    Physical Exam Updated Vital Signs BP (!) 143/106 (BP Location: Left Arm)   Pulse 82   Temp 98.2 F (36.8 C) (Oral)   Resp 15   Ht 6' (1.829 m)   Wt 93 kg (205 lb)   SpO2 100%   BMI 27.80 kg/m   Physical Exam  Nursing note and vitals reviewed.  67 year old male, resting comfortably and in no acute distress. Vital signs are significant for hypertension. Oxygen saturation is 100%, which is normal. Head is normocephalic and atraumatic. PERRLA, EOMI. Oropharynx is clear. Neck is nontender and supple without adenopathy or JVD. Back is nontender and there is no CVA tenderness. Lungs are clear without rales, wheezes, or rhonchi. Chest is nontender. Heart has regular rate and rhythm without murmur. Abdomen is soft, flat, nontender without masses or hepatosplenomegaly and peristalsis is normoactive. Extremities have no cyanosis or edema, full range of motion is present. Skin is warm and dry  without rash. Neurologic: Mental status is normal, cranial nerves are intact, there are no motor or sensory deficits.  ED Treatments / Results  Labs (all labs ordered are listed, but only abnormal results are displayed) Labs Reviewed  BASIC METABOLIC PANEL - Abnormal; Notable for the following components:      Result Value   Glucose, Bld 132 (*)    Creatinine, Ser 1.46 (*)    GFR calc non Af Amer 49 (*)    GFR calc Af Amer 57 (*)    All other components within normal limits  I-STAT TROPONIN, ED - Abnormal; Notable for the following components:   Troponin i, poc 0.09 (*)    All other components within normal limits  CBC    EKG  EKG Interpretation  Date/Time:  Wednesday August 20 2017 01:43:39 EST Ventricular Rate:  83 PR Interval:    QRS Duration: 110 QT Interval:  368 QTC Calculation: 432 R Axis:   -2 Text Interpretation:  Sinus rhythm Right bundle branch block Moderate voltage criteria for LVH, may be normal variant Inferior infarct , age undetermined Abnormal ECG When compared with ECG of 10/26/2015, Right bundle branch block is now present Confirmed by Delora Fuel [id number]) on 08/20/2017 1:55:42 AM       Radiology Dg Chest 2 View  Result Date: 08/20/2017 CLINICAL DATA:  Chest pain EXAM: CHEST  2 VIEW COMPARISON:  06/02/2009 chest radiograph. FINDINGS: Stable cardiomediastinal silhouette with normal heart size. No pneumothorax. No pleural effusion. Faint reticular opacities at both lung bases, not appreciably changed. No pulmonary edema. No acute consolidative airspace disease. IMPRESSION: Chronic faint reticular opacities at both lung bases compatible with nonspecific mild scarring. No acute cardiopulmonary disease. Electronically Signed   By: Ilona Sorrel M.D.   On: 08/20/2017 02:51    Procedures Procedures  CRITICAL CARE Performed by: Delora Fuel Total critical care time: 60 minutes Critical care time was exclusive of separately billable procedures and treating other  patients. Critical care was necessary to treat or prevent imminent or life-threatening deterioration. Critical care was time spent personally by me on the following activities: development of treatment plan with patient and/or surrogate as well as nursing, discussions with consultants, evaluation of patient's response to treatment, examination of patient, obtaining history from patient or surrogate, ordering and performing  treatments and interventions, ordering and review of laboratory studies, ordering and review of radiographic studies, pulse oximetry and re-evaluation of patient's condition.  Medications Ordered in ED Medications  nitroGLYCERIN (NITROSTAT) SL tablet 0.4 mg (not administered)  heparin ADULT infusion 100 units/mL (25000 units/229mL sodium chloride 0.45%) (1,500 Units/hr Intravenous New Bag/Given 08/20/17 0453)  aspirin chewable tablet 243 mg (243 mg Oral Given 08/20/17 0424)  iopamidol (ISOVUE-370) 76 % injection (100 mLs  Contrast Given 08/20/17 0526)  heparin bolus via infusion 5,000 Units (5,000 Units Intravenous Bolus from Bag 08/20/17 0455)     Initial Impression / Assessment and Plan / ED Course  I have reviewed the triage vital signs and the nursing notes.  Pertinent labs & imaging results that were available during my care of the patient were reviewed by me and considered in my medical decision making (see chart for details).  Chest pain with elevated troponin.  This is nonspecific.  ECG shows no acute changes.  He is started on treatment for possible ACS with additional aspirin, nitroglycerin, heparin.  Given history of pulmonary embolism, I do not feel that pulmonary embolism can be adequately ruled out with d-dimer and he will be sent for CT angiogram of the chest.  Pulmonary embolism could also cause mild elevation of troponin as seen here.  His cardiac catheterization was in 2009 and showed 30-40% blockages in all 3 main coronary arteries.  Last cardiology evaluation was  in 2012.  Heart score is 4, which puts him at moderate risk for major adverse cardiac events in the next 30 days.  CT angiogram is negative for pulmonary embolism.  Repeat troponin has increased slightly to 0.11.  He is pain-free following sublingual nitroglycerin.  Case is discussed with Dr. Ellyn Hack of cardiology service who states he will see the patient in consultation.  Case is discussed with Dr. Lorin Mercy of [facility], who agrees to admit the patient.  Final Clinical Impressions(s) / ED Diagnoses   Final diagnoses:  ACS (acute coronary syndrome) [facility])  Renal insufficiency    ED Discharge Orders    None       Delora Fuel, MD 57/01/77 [id number]

## 2017-08-20 NOTE — Progress Notes (Signed)
Post-PCI note:  Radial site clear. No residual chest pain. Discussed his statin-intolerance. Says he's tried 'every one of them' and has severe muscle weakness within 48 hours of taking them. Would refer to lipid clinic at discharge for consideration of PCSK9.   [name] 08/20/2017 7:03 PM

## 2017-08-20 NOTE — H&P (Signed)
History and Physical    [name] Michael ZYS:[id number] DOB: 01/14/1951 DOA: 08/20/2017  PCP: Biagio Borg, MD Consultants:  Vernelle Emerald - Rheumatology Patient coming from:  Home - lives with wife and daughter and granddaughter; Donald Prose: Wife, [phone]  Chief Complaint: chest pain  HPI: [name] Michael is a 67 y.o. male with medical history significant of restrictive lung disease; obesity; HTN; HLD; h/o PE; nonobstructive CAD in 2009; and depression presenting with chest pain.  Patient awoke about 1215 (went to bed about 11) with chest pain, left jaw poain, and B arm pain.  He also had chest and arm pain Sunday but it went away after about an hour.  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  This pain was bilateral across the chest and is described as a pressure.  Sunday, he took 81 mg ASA and metoprolol and about an hour later he was ok.  Today, he took the 81 mg ASA and BP medicine.  He was given another 3 ASA and started on IV heparin.  He was not given NTG.    Cath report from 2009 showed nonobstructive CAD in multiple vessels.  No associated SOB, but he did have mild nausea (more severe on Sunday).  He did help his daughter move on Saturday so he did a lot of heavy lifting.  C spine surgery CAD father MI in his late 15s, CABG in his 45s  ED Course:   Low elevation in troponin and uptrending.  CTA negative for PE.  Dr. Ellyn Hack from cardiology to see patient.  Review of Systems: As per HPI; otherwise review of systems reviewed and negative.   Ambulatory Status:  Ambulates without assistance  Past Medical History:  Diagnosis Date  . ALLERGIC RHINITIS 04/30/2007  . ANEMIA-NOS 07/12/2007  . ANXIETY 02/17/2009  . 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 CAD (coronary artery disease)    LHC 11/09 proximal RCA 30%, mid RCA 30%, distal RCA with serial 30-40%, EF 40-45%, normal right heart pressures.  Echo 11/10 mild LVH, EF 25-42%, grade 1 diastolic  dysfunction, aortic root moderately dilated, mild LAE.;  Myoview 11/10: Small lateral scar, no ischemia, EF 61%.    . DEPRESSIVE DISORDER 02/14/2009  . History of pulmonary embolism 03/09/2008  . HYPERLIPIDEMIA 04/27/2007  . HYPERTENSION 04/27/2007  . INSOMNIA UNSPECIFIED 02/20/2010  . 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 Nonspecific abnormal toxicological findings 03/14/2008  . OBESITY 04/30/2007  . OSTEOARTHRITIS 04/30/2007  . 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 PROSTATITIS, HX OF 04/30/2007  . RESTRICTIVE LUNG DISEASE 06/20/2009  . THORACIC AORTIC ANEURYSM 07/06/2009   Chest CT 11/12: ascending thoracic aortic aneurysm stable at 42 mm.   Marland Kitchen UNSPECIFIED MYALGIA AND MYOSITIS 02/14/2009    Past Surgical History:  Procedure Laterality Date  . APPENDECTOMY    . BACK SURGERY    . CARDIAC CATHETERIZATION  06/2008   Non-obstructive CAD.  EF 40-45%. Anterior - apical HK  . NM MYOVIEW LTD  06/2009   Abnormal stress nuclear study with small prior lateral infarct; no ischemia. Normal EF  . TRANSTHORACIC ECHOCARDIOGRAM  06/2009   55% to 60%. Wall motion was normal.  Mod Dilated [place].  Mild LA dilation.    Social History   Socioeconomic History  . Marital status: Married    Spouse name: Not on file  . Number of children: Not on file  . Years of education: Not on file  . Highest education level: Not on file  Social  Needs  . Financial resource strain: Not on file  . Food insecurity - worry: Not on file  . Food insecurity - inability: Not on file  . Transportation needs - medical: Not on file  . Transportation needs - non-medical: Not on file  Occupational History  . Not on file  Tobacco Use  . Smoking status: Never Smoker  . Smokeless tobacco: Never Used  Substance and Sexual Activity  . Alcohol use: No  . Drug use: No  . Sexual activity: Not on file  Other Topics Concern  . Not on file  Social History Narrative  . Not on file    Allergies  Allergen Reactions  . Atorvastatin      REACTION: myalgias  . Ciprofloxacin Other (See Comments)    myalgias  . Crestor [Rosuvastatin]     myalgias  . Doxycycline   . Hydroxychloroquine Sulfate     REACTION: GI upset and H, pt is currently taking and still has some GI problems but is tolerating it    Family History  Problem Relation Age of Onset  . Diabetes Mother   . Heart disease Mother   . Hyperlipidemia Mother   . Stroke Mother   . Diabetes Father   . Heart disease Father   . Hyperlipidemia Father   . Stroke Father   . Hypertension Sister   . Diabetes Brother   . Diabetes Brother   . Heart disease Brother   . Hypertension Brother   . Alcohol abuse Other   . Diabetes Other   . Hypertension Other   . Heart disease Other   . Stroke Other     Prior to Admission medications   Medication Sig Start Date End Date Taking? Authorizing Provider  clonazePAM (KLONOPIN) 1 MG tablet TAKE 1 TABLET BY MOUTH TWICE A DAY IF NEEDED FOR ANXIETY  08/06/17  Yes Biagio Borg, MD  hydroxychloroquine (PLAQUENIL) 200 MG tablet Take 200 mg by mouth 2 (two) times daily.    Yes [provider]  leflunomide (ARAVA) 20 MG tablet Take 20 mg by mouth daily.     Yes [provider]  losartan (COZAAR) 100 MG tablet Take 1 tablet (100 mg total) by mouth daily. 04/03/17  Yes Biagio Borg, MD  metoprolol tartrate (LOPRESSOR) 25 MG tablet TAKE 1 TABLET BY MOUTH TWICE A DAY 06/19/17  Yes Biagio Borg, MD  omeprazole (PRILOSEC) 20 MG capsule TAKE ONE CAPSULE BY MOUTH TWICE DAILY  08/06/17  Yes Biagio Borg, MD  zolpidem (AMBIEN CR) 12.5 MG CR tablet TAKE ONE TABLET BY MOUTH AT BEDTIME AS NEEDED  07/04/17  Yes Biagio Borg, MD  celecoxib (CELEBREX) 200 MG capsule take 1 capsule by mouth twice a day if needed Patient not taking: Reported on 08/20/2017 06/03/16   Biagio Borg, MD  ezetimibe (ZETIA) 10 MG tablet Take 1 tablet (10 mg total) by mouth daily. Patient not taking: Reported on 08/20/2017 05/28/16   Biagio Borg, MD   sildenafil (VIAGRA) 100 MG tablet Take 0.5-1 tablets (50-100 mg total) by mouth daily as needed for erectile dysfunction. Patient not taking: Reported on 08/20/2017 04/03/17   Biagio Borg, MD  sulfamethoxazole-trimethoprim (BACTRIM DS) 800-160 MG tablet Take 1 tablet by mouth 2 (two) times daily. Patient not taking: Reported on 08/20/2017 06/07/16   Biagio Borg, MD  tamsulosin (FLOMAX) 0.4 MG CAPS capsule Take 1 capsule (0.4 mg total) by mouth daily. Patient not taking: Reported on 08/20/2017 07/08/16  Biagio Borg, MD    Physical Exam: Vitals:   08/20/17 0645 08/20/17 0719 08/20/17 0745 08/20/17 0815  BP: 116/89 (!) 114/92 (!) 124/94 129/90  Pulse: 74 82 74 77  Resp: 12 16 11 17   Temp:      TempSrc:      SpO2: 95% 97% 96% 96%  Weight:      Height:         General:  Appears calm and comfortable and is NAD Eyes:  PERRL, EOMI, normal lids, iris ENT:  grossly normal hearing, lips & tongue, mmm; appropriate dentition Neck:  no LAD, masses or thyromegaly; no carotid bruits Cardiovascular:  RRR, no m/r/g. No LE edema.  Respiratory:   CTA bilaterally with no wheezes/rales/rhonchi.  Normal respiratory effort. Abdomen:  soft, NT, ND, NABS Skin:  no rash or induration seen on limited exam Musculoskeletal:  grossly normal tone BUE/BLE, good ROM, no bony abnormality Psychiatric:  grossly normal mood and affect, speech fluent and appropriate, AOx3 Neurologic:  CN 2-12 grossly intact, moves all extremities in coordinated fashion, sensation intact    Radiological Exams on Admission: Dg Chest 2 View  Result Date: 08/20/2017 CLINICAL DATA:  Chest pain EXAM: CHEST  2 VIEW COMPARISON:  06/02/2009 chest radiograph. FINDINGS: Stable cardiomediastinal silhouette with normal heart size. No pneumothorax. No pleural effusion. Faint reticular opacities at both lung bases, not appreciably changed. No pulmonary edema. No acute consolidative airspace disease. IMPRESSION: Chronic faint reticular opacities  at both lung bases compatible with nonspecific mild scarring. No acute cardiopulmonary disease. Electronically Signed   By: Ilona Sorrel M.D.   On: 08/20/2017 02:51   Ct Angio Chest Pe W And/or Wo Contrast  Result Date: 08/20/2017 CLINICAL DATA:  67 year old male with central chest pain EXAM: CT ANGIOGRAPHY CHEST WITH CONTRAST TECHNIQUE: Multidetector CT imaging of the chest was performed using the standard protocol during bolus administration of intravenous contrast. Multiplanar CT image reconstructions and MIPs were obtained to evaluate the vascular anatomy. CONTRAST:  189mL ISOVUE-370 IOPAMIDOL (ISOVUE-370) INJECTION 76% COMPARISON:  Chest radiograph dated 08/20/2017 FINDINGS: Cardiovascular: There is mild cardiomegaly. No pericardial effusion. There is multi vessel coronary vascular calcification. Mildly dilated ascending aorta measures 4.6 cm in diameter. Evaluation of the aorta is limited due to suboptimal enhancement and timing of the contrast. There is no CT evidence of pulmonary embolism. Mediastinum/Nodes: No hilar or mediastinal adenopathy. Esophagus and the thyroid gland are grossly unremarkable. No mediastinal fluid collection. Lungs/Pleura: A 3 mm subpleural calcified granuloma in the lingula. The lungs are otherwise clear. There is no pleural effusion or pneumothorax. The central airways are patent. Upper Abdomen: Apparent mild fatty liver. The visualized upper abdomen is otherwise unremarkable. Musculoskeletal: No acute osseous pathology. Review of the MIP images confirms the above findings. IMPRESSION: 1. No acute intrathoracic pathology. No CT evidence of pulmonary embolism. 2. Mild cardiomegaly and multi vessel coronary vascular calcification. 3. Mildly dilated ascending aorta measuring 4.6 cm in diameter. Ascending thoracic aortic aneurysm. Recommend semi-annual imaging followup by CTA or MRA and referral to cardiothoracic surgery if not already obtained. This recommendation follows 2010  ACCF/AHA/AATS/ACR/ASA/SCA/SCAI/SIR/STS/SVM Guidelines for the Diagnosis and Management of Patients With Thoracic Aortic Disease. Circulation. 2010; 121: Z366-Y403 Electronically Signed   By: Anner Crete M.D.   On: 08/20/2017 05:51    EKG: Independently reviewed.  NSR with rate 83; RBBB, LVH, nonspecific ST changes with no evidence of acute ischemia   Labs on Admission: I have personally reviewed the available labs and imaging studies at  the time of the admission.  Pertinent labs:   Glucose 132 BUN 16/Creatinne 1.46/GFR 49; prior 12/1.15/68 in 8/18 Troponin 0.09 -> 0.11 Normal CBC   Assessment/Plan Principal Problem:   ACS (acute coronary syndrome) (HCC) Active Problems:   Hyperlipidemia   OBESITY   Essential hypertension   Rheumatoid arthritis ([place])   CAD (coronary artery disease)   Hyperglycemia   AAA (abdominal aortic aneurysm) (HCC)   ACS with h/o nonobstructive CAD -Patient with substernal chest pain that came on acutely at rest and has been constant for several hours but improved with ASA, ? NTG (patient and his wife state that he did not receive any but it does appear to have been ordered). -1-2/3 typical symptoms suggestive of atypical chest pain.  -CXR unremarkable.   -Initial troponin positive and uptrending - but subtly.   -EKG with nonspecific ST changes of uncertain duration, no apparent STEMI. -TIMI risk score is 4; which predicts a 14 days risk of death, recurrent MI, or urgent revascularization of 19.9%.  -Will plan to admit to [facility] on telemetry to further evaluate for ACS.  -Cardiology consulted and it appears that they will plan to take patient for cardiac catheterization soon to answer the question of if this is an acute ischemic event -Continue to cycle troponin -Repeat EKG in AM -ASA 325 mg PO daily -morphine given -Start Pravastatin 40 mg for now (previously developed myalgias with both Lipitor and Crestor so will try this more hydrophilic statin to  see if this is more tolerable - despite lower potency; if able to tolerate, it may be worth a trial of increasing to 80 mg daily) -Risk factor stratification with FLP and HgbA1c; will also check TSH  -Of note, with his h/o RA and heavy lifting on Saturday, will check CK level - particularly in light of his mildly elevated creatinine compared to baseline to r/o rhabdo  HTN -Takes Lopressor and Cozaar at home; will continue -Will also add prn hydralazine  HLD -As above, start pravastatin -Lipids were checked in 8/18 (TC 218, HDL 33, LDL 131, TG 236) -Recheck lipid panel in AM  Hyperglycemia -May be stress response -Will follow with fasting AM labs and check A1c  Obesity -Patient with BMI 29 so technically he is overweight but close to obese -Hypertriglyceridemia and low HDL indicates sedentary lifestyle  AAA -4.6 cm -Needs outpatient CT surgery referral for semi-annual surveillance    DVT prophylaxis: Heparin drip Code Status:  Full - confirmed with patient/family Family Communication: Wife present throughout evaluation  Disposition Plan:  Home once clinically improved Consults called: Cardiology  Admission status: Admit - It is my clinical opinion that admission to [place] is reasonable and necessary because of the expectation that this patient will require hospital care that crosses at least 2 midnights to treat this condition based on the medical complexity of the problems presented.  Given the aforementioned information, the predictability of an adverse outcome is felt to be significant.    Karmen Bongo MD [facility]  If note is complete, please contact covering daytime or nighttime physician. [email or web address] Password [email or web address]  08/20/2017, 9:15 AM

## 2017-08-20 NOTE — Progress Notes (Signed)
ANTICOAGULATION CONSULT NOTE - Initial Consult  Pharmacy Consult for Heparin  Indication: chest pain/ACS, rule out PE  Allergies  Allergen Reactions  . Atorvastatin     REACTION: myalgias  . Ciprofloxacin Other (See Comments)    myalgias  . Crestor [Rosuvastatin]     myalgias  . Doxycycline   . Hydroxychloroquine Sulfate     REACTION: GI upset and H, pt is currently taking and still has some GI problems but is tolerating it    Patient Measurements: Height: 6' (182.9 cm) Weight: 205 lb (93 kg) IBW/kg (Calculated) : 77.6  Heparin dosing weight: 93 kg  Vital Signs: Temp: 98.2 F (36.8 C) (01/16 0148) Temp Source: Oral (01/16 0148) BP: 143/106 (01/16 0148) Pulse Rate: 82 (01/16 0148)  Labs: Recent Labs    08/20/17 0157  HGB 13.2  HCT 40.5  PLT 292  CREATININE 1.46*    Estimated Creatinine Clearance: 56.1 mL/min (A) (by C-G formula based on SCr of 1.46 mg/dL (H)).   Medical History: Past Medical History:  Diagnosis Date  . ALLERGIC RHINITIS 04/30/2007  . ANEMIA-NOS 07/12/2007  . ANXIETY 02/17/2009  . 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 CAD (coronary artery disease)    LHC 11/09 proximal RCA 30%, mid RCA 30%, distal RCA with serial 30-40%, EF 40-45%, normal right heart pressures.  Echo 11/10 mild LVH, EF 69-79%, grade 1 diastolic dysfunction, aortic root moderately dilated, mild LAE.;  Myoview 11/10: Small lateral scar, no ischemia, EF 61%.    . DEPRESSIVE DISORDER 02/14/2009  . History of pulmonary embolism 03/09/2008  . HYPERLIPIDEMIA 04/27/2007  . HYPERTENSION 04/27/2007  . INSOMNIA UNSPECIFIED 02/20/2010  . 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 Nonspecific abnormal toxicological findings 03/14/2008  . OBESITY 04/30/2007  . OSTEOARTHRITIS 04/30/2007  . 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 PROSTATITIS, HX OF 04/30/2007  . RESTRICTIVE LUNG DISEASE 06/20/2009  . THORACIC AORTIC ANEURYSM 07/06/2009   Chest CT 11/12: ascending thoracic aortic aneurysm stable at 42 mm.   Marland Kitchen  UNSPECIFIED MYALGIA AND MYOSITIS 02/14/2009    Assessment: 67 y/o M awoke from sleep with radiating CP, also has hx of PE in 2009, currently not on anti-coagulation, mildly elevated troponin, CT angio ordered. CBC good.   Goal of Therapy:  Heparin level 0.3-0.7 units/ml Monitor platelets by anticoagulation protocol: Yes   Plan:  Heparin 5000 units BOLUS Start heparin drip at 1500 units/hr 1200 HL Daily CBC/HL Monitor for bleeding F/U CT Angio results   Narda Bonds 08/20/2017,3:43 AM

## 2017-08-20 NOTE — ED Notes (Signed)
Brittany-RN@NF  notified of elevated trop of 0.09

## 2017-08-21 ENCOUNTER — Encounter (HOSPITAL_COMMUNITY): Payer: Self-pay | Admitting: Cardiovascular Disease

## 2017-08-21 ENCOUNTER — Inpatient Hospital Stay (HOSPITAL_COMMUNITY): Payer: Self-pay

## 2017-08-21 DIAGNOSIS — R079 Chest pain, unspecified: Secondary | ICD-10-CM

## 2017-08-21 DIAGNOSIS — N179 Acute kidney failure, unspecified: Secondary | ICD-10-CM

## 2017-08-21 DIAGNOSIS — I25110 Atherosclerotic heart disease of native coronary artery with unstable angina pectoris: Secondary | ICD-10-CM

## 2017-08-21 DIAGNOSIS — I214 Non-ST elevation (NSTEMI) myocardial infarction: Secondary | ICD-10-CM

## 2017-08-21 DIAGNOSIS — Z955 Presence of coronary angioplasty implant and graft: Secondary | ICD-10-CM

## 2017-08-21 HISTORY — DX: Non-ST elevation (NSTEMI) myocardial infarction: I21.4

## 2017-08-21 HISTORY — DX: Presence of coronary angioplasty implant and graft: Z95.5

## 2017-08-21 LAB — CBC
HCT: 38.5 % — ABNORMAL LOW (ref 39.0–52.0)
Hemoglobin: 12.3 g/dL — ABNORMAL LOW (ref 13.0–17.0)
MCH: 28.8 pg (ref 26.0–34.0)
MCHC: 31.9 g/dL (ref 30.0–36.0)
MCV: 90.2 fL (ref 78.0–100.0)
PLATELETS: 314 10*3/uL (ref 150–400)
RBC: 4.27 MIL/uL (ref 4.22–5.81)
RDW: 12.9 % (ref 11.5–15.5)
WBC: 10.1 10*3/uL (ref 4.0–10.5)

## 2017-08-21 LAB — PROTIME-INR
INR: 1
PROTHROMBIN TIME: 13.1 s (ref 11.4–15.2)

## 2017-08-21 LAB — HIV ANTIBODY (ROUTINE TESTING W REFLEX): HIV Screen 4th Generation wRfx: NONREACTIVE

## 2017-08-21 LAB — BASIC METABOLIC PANEL
ANION GAP: 10 (ref 5–15)
BUN: 10 mg/dL (ref 6–20)
CALCIUM: 9.2 mg/dL (ref 8.9–10.3)
CHLORIDE: 107 mmol/L (ref 101–111)
CO2: 23 mmol/L (ref 22–32)
CREATININE: 1.24 mg/dL (ref 0.61–1.24)
GFR calc non Af Amer: 60 mL/min — ABNORMAL LOW (ref >60)
Glucose, Bld: 115 mg/dL — ABNORMAL HIGH (ref 65–99)
Potassium: 4.3 mmol/L (ref 3.5–5.1)
Sodium: 140 mmol/L (ref 135–145)

## 2017-08-21 LAB — LIPID PANEL
Cholesterol: 179 mg/dL (ref 0–200)
HDL: 31 mg/dL — ABNORMAL LOW (ref >40)
LDL CALC: 116 mg/dL — AB (ref 0–99)
TRIGLYCERIDES: 159 mg/dL — AB (ref <150)
Total CHOL/HDL Ratio: 5.8 RATIO
VLDL: 32 mg/dL (ref 0–40)

## 2017-08-21 LAB — ECHOCARDIOGRAM COMPLETE
Height: 72 in
Weight: 3421.54 oz

## 2017-08-21 MED ORDER — LOSARTAN POTASSIUM 50 MG PO TABS
50.0000 mg | ORAL_TABLET | Freq: Every day | ORAL | Status: DC
  Administered 2017-08-21 – 2017-08-22 (×2): 50 mg via ORAL
  Filled 2017-08-21 (×2): qty 1

## 2017-08-21 MED FILL — Morphine Sulfate Inj 4 MG/ML: INTRAMUSCULAR | Qty: 0.5 | Status: AC

## 2017-08-21 NOTE — Progress Notes (Signed)
CARDIAC REHAB PHASE I   PRE:  Rate/Rhythm: 93 SR  BP:  Supine:   Sitting: 150/86  Standing:    SaO2: 97%RA  MODE:  Ambulation: 800 ft   POST:  Rate/Rhythm: 110 ST  BP:  Supine:   Sitting: 140/82  Standing:    SaO2: 96%RA 0830-0935 Pt walked 800 ft without CP with steady gait. Tolerated well. Took BP manually at 150/86 as reading higher with automatic cuff. MI ed done with pt and wife who voiced understanding. Stressed importance of brilinta with stent. To see case Freight forwarder. Reviewed NTG use, ex ed, heart healthy diet and MI restrictions. Discussed CRP 2 and will refer to [facility]. Pt has no insurance at this time but will get Medicare in August.   Graylon Good, RN BSN  08/21/2017 9:31 AM

## 2017-08-21 NOTE — Progress Notes (Signed)
2D Echocardiogram has been performed.  [name] Michael 08/21/2017, 1:26 PM

## 2017-08-21 NOTE — Progress Notes (Signed)
PROGRESS NOTE    [name] Michael  CBJ:[id number] DOB: April 23, 1951 DOA: 08/20/2017 PCP: Biagio Borg, MD   Brief Narrative: 67 year old male with history of interstitial lung disease, obesity, hypertension, hyperlipidemia, nonobstructive CAD in 2009 depression presented with chest pain.  Found to have elevated troponin status post cardiac cath with a stent placement.  Assessment & Plan:   #Chest pain/non-STEMI: Status post cardiac catheter with drug-eluting stent placement.  Continue aspirin, Brilinta.  Refer to lipid clinic on discharge as per cardiologist.  Continue current care.  Follow-up echocardiogram.  #Presence of drug coated stent in left circumflex coronary artery Non-ST elevation (NSTEMI) myocardial infarction ([place]) -TYPE 4A peri-PCI  #Hypertension: Currently on low-dose of Cozaar, metoprolol.  Monitor blood pressure.  #Hyperlipidemia: LDL 116.  Not tolerating statin as per cardiology.  Follow-up in lipid clinic on discharge.  #Acute kidney injury on admission: Serum creatinine level trending down.  Monitor BMP.  Repeat BMP in the morning.  Does not have urinary symptoms.  #AAA: 4.6 cm.  Recommend outpatient follow-up with surgeon.  DVT prophylaxis: Ambulation and SCD.  Patient is on aspirin and Brilinta. Code Status: full Code Family Communication: Patient's wife at bedside Disposition Plan: Likely discharge home in 1-2 days    Consultants:   Cardiology  Procedures: Cath Antimicrobials: None  Subjective: Seen and examined at bedside.  Denies chest pain, shortness of breath, nausea vomiting.  No headache or dizziness.  Objective: Vitals:   08/21/17 0454 08/21/17 0545 08/21/17 0737 08/21/17 1111  BP: (!) 152/107 (!) 146/105 135/87 (!) 122/92  Pulse: 85 72 92 80  Resp: (!) 23 10 15 17   Temp:   98.1 F (36.7 C) 98.8 F (37.1 C)  TempSrc:   Oral Oral  SpO2: 97% 96% 98% 96%  Weight:      Height:        Intake/Output Summary (Last 24 hours) at 08/21/2017  1301 Last data filed at 08/21/2017 0753 Gross per 24 hour  Intake 1922.71 ml  Output 1630 ml  Net 292.71 ml   Filed Weights   08/20/17 0147 08/20/17 0924 08/21/17 0418  Weight: 93 kg (205 lb) 97.4 kg (214 lb 12.8 oz) 97 kg (213 lb 13.5 oz)    Examination:  General exam: Appears calm and comfortable  Respiratory system: Clear to auscultation. Respiratory effort normal. No wheezing or crackle Cardiovascular system: S1 & S2 heard, RRR.  No pedal edema. Gastrointestinal system: Abdomen is nondistended, soft and nontender. Normal bowel sounds heard. Central nervous system: Alert and oriented. No focal neurological deficits. Extremities: Symmetric 5 x 5 power. Skin: No rashes, lesions or ulcers Psychiatry: Judgement and insight appear normal. Mood & affect appropriate.     Data Reviewed: I have personally reviewed following labs and imaging studies  CBC: Recent Labs  Lab 08/20/17 0157 08/20/17 1536 08/21/17 0400  WBC 8.7 8.5 10.1  HGB 13.2 11.8* 12.3*  HCT 40.5 36.8* 38.5*  MCV 90.0 89.8 90.2  PLT 292 284 160   Basic Metabolic Panel: Recent Labs  Lab 08/20/17 0157 08/21/17 0400  NA 138 140  K 4.4 4.3  CL 103 107  CO2 23 23  GLUCOSE 132* 115*  BUN 16 10  CREATININE 1.46* 1.24  CALCIUM 9.4 9.2   GFR: Estimated Creatinine Clearance: 72.7 mL/min (by C-G formula based on SCr of 1.24 mg/dL). Liver Function Tests: No results for input(s): AST, ALT, ALKPHOS, BILITOT, PROT, ALBUMIN in the last 168 hours. No results for input(s): LIPASE, AMYLASE in the last 168 hours.  No results for input(s): AMMONIA in the last 168 hours. Coagulation Profile: Recent Labs  Lab 08/21/17 0400  INR 1.00   Cardiac Enzymes: Recent Labs  Lab 08/20/17 0919 08/20/17 1536 08/20/17 2104  CKTOTAL 126  --   --   TROPONINI 0.19* 0.36* 3.38*   BNP (last 3 results) No results for input(s): PROBNP in the last 8760 hours. HbA1C: Recent Labs    08/20/17 0919  HGBA1C 5.2   CBG: No  results for input(s): GLUCAP in the last 168 hours. Lipid Profile: Recent Labs    08/21/17 0400  CHOL 179  HDL 31*  LDLCALC 116*  TRIG 159*  CHOLHDL 5.8   Thyroid Function Tests: Recent Labs    08/20/17 0919  TSH 1.466   Anemia Panel: No results for input(s): VITAMINB12, FOLATE, FERRITIN, TIBC, IRON, RETICCTPCT in the last 72 hours. Sepsis Labs: No results for input(s): PROCALCITON, LATICACIDVEN in the last 168 hours.  No results found for this or any previous visit (from the past 240 hour(s)).       Radiology Studies: Dg Chest 2 View  Result Date: 08/20/2017 CLINICAL DATA:  Chest pain EXAM: CHEST  2 VIEW COMPARISON:  06/02/2009 chest radiograph. FINDINGS: Stable cardiomediastinal silhouette with normal heart size. No pneumothorax. No pleural effusion. Faint reticular opacities at both lung bases, not appreciably changed. No pulmonary edema. No acute consolidative airspace disease. IMPRESSION: Chronic faint reticular opacities at both lung bases compatible with nonspecific mild scarring. No acute cardiopulmonary disease. Electronically Signed   By: Ilona Sorrel M.D.   On: 08/20/2017 02:51   Ct Angio Chest Pe W And/or Wo Contrast  Result Date: 08/20/2017 CLINICAL DATA:  67 year old male with central chest pain EXAM: CT ANGIOGRAPHY CHEST WITH CONTRAST TECHNIQUE: Multidetector CT imaging of the chest was performed using the standard protocol during bolus administration of intravenous contrast. Multiplanar CT image reconstructions and MIPs were obtained to evaluate the vascular anatomy. CONTRAST:  161mL ISOVUE-370 IOPAMIDOL (ISOVUE-370) INJECTION 76% COMPARISON:  Chest radiograph dated 08/20/2017 FINDINGS: Cardiovascular: There is mild cardiomegaly. No pericardial effusion. There is multi vessel coronary vascular calcification. Mildly dilated ascending aorta measures 4.6 cm in diameter. Evaluation of the aorta is limited due to suboptimal enhancement and timing of the contrast. There  is no CT evidence of pulmonary embolism. Mediastinum/Nodes: No hilar or mediastinal adenopathy. Esophagus and the thyroid gland are grossly unremarkable. No mediastinal fluid collection. Lungs/Pleura: A 3 mm subpleural calcified granuloma in the lingula. The lungs are otherwise clear. There is no pleural effusion or pneumothorax. The central airways are patent. Upper Abdomen: Apparent mild fatty liver. The visualized upper abdomen is otherwise unremarkable. Musculoskeletal: No acute osseous pathology. Review of the MIP images confirms the above findings. IMPRESSION: 1. No acute intrathoracic pathology. No CT evidence of pulmonary embolism. 2. Mild cardiomegaly and multi vessel coronary vascular calcification. 3. Mildly dilated ascending aorta measuring 4.6 cm in diameter. Ascending thoracic aortic aneurysm. Recommend semi-annual imaging followup by CTA or MRA and referral to cardiothoracic surgery if not already obtained. This recommendation follows 2010 ACCF/AHA/AATS/ACR/ASA/SCA/SCAI/SIR/STS/SVM Guidelines for the Diagnosis and Management of Patients With Thoracic Aortic Disease. Circulation. 2010; 121: W299-B716 Electronically Signed   By: Anner Crete M.D.   On: 08/20/2017 05:51        Scheduled Meds: . aspirin EC  81 mg Oral Daily  . hydroxychloroquine  200 mg Oral BID  . losartan  50 mg Oral Daily  . metoprolol tartrate  25 mg Oral BID  . pantoprazole  40  mg Oral Daily  . ticagrelor  90 mg Oral BID   Continuous Infusions: . sodium chloride       LOS: 1 day    Dron Tanna Furry, MD [facility] Pager [phone]  If 7PM-7AM, please contact night-coverage [email or web address] Password [email or web address] 08/21/2017, 1:01 PM

## 2017-08-21 NOTE — Progress Notes (Signed)
Progress Note  Patient Name: [name] Michael Date of Encounter: 08/21/2017  Primary Cardiologist: Glenetta Hew, MD  Subjective   Pt feeling well this AM. Denies chest pain. Walking with cardiac rehab. Will keep one more night given his jump in trop level intra-procedure.   Inpatient Medications    Scheduled Meds: . aspirin EC  81 mg Oral Daily  . hydroxychloroquine  200 mg Oral BID  . metoprolol tartrate  25 mg Oral BID  . pantoprazole  40 mg Oral Daily  . ticagrelor  90 mg Oral BID   Continuous Infusions: . sodium chloride     PRN Meds: sodium chloride, acetaminophen, clonazePAM, hydrALAZINE, morphine injection, nitroGLYCERIN, ondansetron (ZOFRAN) IV, sodium chloride flush, zolpidem   Vital Signs    Vitals:   08/21/17 0418 08/21/17 0454 08/21/17 0545 08/21/17 0737  BP: (!) 172/93 (!) 152/107 (!) 146/105 135/87  Pulse: 87 85 72 92  Resp: 16 (!) 23 10 15   Temp: 97.7 F (36.5 C)   98.1 F (36.7 C)  TempSrc: Axillary   Oral  SpO2: 97% 97% 96% 98%  Weight: 213 lb 13.5 oz (97 kg)     Height:        Intake/Output Summary (Last 24 hours) at 08/21/2017 0904 Last data filed at 08/21/2017 0753 Gross per 24 hour  Intake 1922.71 ml  Output 1630 ml  Net 292.71 ml   Filed Weights   08/20/17 0147 08/20/17 0924 08/21/17 0418  Weight: 205 lb (93 kg) 214 lb 12.8 oz (97.4 kg) 213 lb 13.5 oz (97 kg)    Physical Exam   General: Well developed, well nourished, NAD Skin: Warm, dry, intact  Head: Normocephalic, atraumatic, clear, moist mucus membranes. Neck: Negative for carotid bruits. No JVD Lungs:Clear to ausculation bilaterally. No wheezes, rales, or rhonchi. Breathing is unlabored. Cardiovascular: RRR with S1 S2. No murmurs, rubs, gallops, or LV heave appreciated. Abdomen: Soft, non-tender, non-distended with normoactive bowel sounds. No obvious abdominal masses. MSK: Strength and tone appear normal for age. 5/5 in all extremities Extremities: No edema. No clubbing  or cyanosis. DP/PT pulses 2+ bilaterally Neuro: Alert and oriented. No focal deficits. No facial asymmetry. MAE spontaneously. Psych: Responds to questions appropriately with normal affect.    Labs    Chemistry Recent Labs  Lab 08/20/17 0157 08/21/17 0400  NA 138 140  K 4.4 4.3  CL 103 107  CO2 23 23  GLUCOSE 132* 115*  BUN 16 10  CREATININE 1.46* 1.24  CALCIUM 9.4 9.2  GFRNONAA 49* 60*  GFRAA 57* >60  ANIONGAP 12 10     Hematology Recent Labs  Lab 08/20/17 0157 08/20/17 1536 08/21/17 0400  WBC 8.7 8.5 10.1  RBC 4.50 4.10* 4.27  HGB 13.2 11.8* 12.3*  HCT 40.5 36.8* 38.5*  MCV 90.0 89.8 90.2  MCH 29.3 28.8 28.8  MCHC 32.6 32.1 31.9  RDW 12.8 13.2 12.9  PLT 292 284 314    Cardiac Enzymes Recent Labs  Lab 08/20/17 0919 08/20/17 1536 08/20/17 2104  TROPONINI 0.19* 0.36* 3.38*    Recent Labs  Lab 08/20/17 0237 08/20/17 0525  TROPIPOC 0.09* 0.11*     BNPNo results for input(s): BNP, PROBNP in the last 168 hours.   DDimer No results for input(s): DDIMER in the last 168 hours.   Radiology    Dg Chest 2 View  Result Date: 08/20/2017 CLINICAL DATA:  Chest pain EXAM: CHEST  2 VIEW COMPARISON:  06/02/2009 chest radiograph. FINDINGS: Stable cardiomediastinal silhouette with  normal heart size. No pneumothorax. No pleural effusion. Faint reticular opacities at both lung bases, not appreciably changed. No pulmonary edema. No acute consolidative airspace disease. IMPRESSION: Chronic faint reticular opacities at both lung bases compatible with nonspecific mild scarring. No acute cardiopulmonary disease. Electronically Signed   By: Ilona Sorrel M.D.   On: 08/20/2017 02:51   Ct Angio Chest Pe W And/or Wo Contrast  Result Date: 08/20/2017 CLINICAL DATA:  67 year old male with central chest pain EXAM: CT ANGIOGRAPHY CHEST WITH CONTRAST TECHNIQUE: Multidetector CT imaging of the chest was performed using the standard protocol during bolus administration of intravenous  contrast. Multiplanar CT image reconstructions and MIPs were obtained to evaluate the vascular anatomy. CONTRAST:  174mL ISOVUE-370 IOPAMIDOL (ISOVUE-370) INJECTION 76% COMPARISON:  Chest radiograph dated 08/20/2017 FINDINGS: Cardiovascular: There is mild cardiomegaly. No pericardial effusion. There is multi vessel coronary vascular calcification. Mildly dilated ascending aorta measures 4.6 cm in diameter. Evaluation of the aorta is limited due to suboptimal enhancement and timing of the contrast. There is no CT evidence of pulmonary embolism. Mediastinum/Nodes: No hilar or mediastinal adenopathy. Esophagus and the thyroid gland are grossly unremarkable. No mediastinal fluid collection. Lungs/Pleura: A 3 mm subpleural calcified granuloma in the lingula. The lungs are otherwise clear. There is no pleural effusion or pneumothorax. The central airways are patent. Upper Abdomen: Apparent mild fatty liver. The visualized upper abdomen is otherwise unremarkable. Musculoskeletal: No acute osseous pathology. Review of the MIP images confirms the above findings. IMPRESSION: 1. No acute intrathoracic pathology. No CT evidence of pulmonary embolism. 2. Mild cardiomegaly and multi vessel coronary vascular calcification. 3. Mildly dilated ascending aorta measuring 4.6 cm in diameter. Ascending thoracic aortic aneurysm. Recommend semi-annual imaging followup by CTA or MRA and referral to cardiothoracic surgery if not already obtained. This recommendation follows 2010 ACCF/AHA/AATS/ACR/ASA/SCA/SCAI/SIR/STS/SVM Guidelines for the Diagnosis and Management of Patients With Thoracic Aortic Disease. Circulation. 2010; 121: T016-W109 Electronically Signed   By: Anner Crete M.D.   On: 08/20/2017 05:51    Telemetry    08/21/17 NSR with RBBB - Personally Reviewed  ECG    08/20/17 Sinus rhythm with RBBB, LVH - Personally Reviewed  Cardiac Studies   Cardiac cath 08/20/17:   Ost Cx to Prox Cx lesion is 90% stenosed.  A  drug-eluting stent was successfully placed using a STENT SYNERGY DES 3.5X16.  Post intervention, there is a 0% residual stenosis.  Prox RCA lesion is 50% stenosed.  Mid RCA lesion is 50% stenosed.  1. Severe single vessel CAD involving the ostial/proximal circumflex treated with PTCA and DES implantation (3.5x16 mm Synergy DES) 2. Diffuse coronary ectasia involving the LAD, circumflex, and RCA 3. PCI complicated by distal embolization into the ramus intermediate branches, treated with tirofiban and IC verapamil, pt hemodynamically stable with mild chest discomfort at procedure completion  Recommend: Tirofiban x 18 hours, DAPT with ASA and ticagrelor at least 12 months, consider long-term DAPT in setting multivessel ectasia. Will order echo for assessment of LV function.  Echo 08/21/17: Pending   Patient Profile     67 y.o. male with a hx of rheumatoid arthritis, nonobstructive coronary disease by cath in 2009, history of PE, hypertension, hyperlipidemia, thoracic aortic aneurysm and a history of a tachycardia treated with a beta-blocker who is followed by cardiology for unstable angina  s/p cath and PCI to the prox LCx 08/20/17.   Assessment & Plan    1. Unstable Angina/ obstructive CAD s/p PTCA to the prox LCx: -- Type 4a MI/non-STEMI resulting  from distal embolization of thrombus down bifurcating ramus intermedius. -PTCA to the LCx 08/20/17 -Denies CP overnight -Given distal embolization into the ramus intra procedure and jump in troponin level, will keep one additional night  -On ASA, BB with statin intolerance, to be referred to lipid clinic at discharge for PCSK9 -Cardiac rehab today -Echocardiogram pending   2. HTN: -Elevated, 135/87>146/105>152/107>146/98 -Continue lopressor  -Will add cozaar 50mg  daily today, if renal function ok tomorrow>> can increase to home dose of 100mg  daily  3. HLD: -As above, intolerance to statin therapy -Will refer to lipid clinic at  discharge for PCSK9 -Cho-179, HDL-31, LDL-116, Trig-159  4. AKI: -Cr, 1.24. Improved from 1.46.  -Will add back cozaar at half dose given renal improvement. If stable tomorrow, can resume full home dose of 100 mg daily -Will continue to monitor  -Avoid nephrotoxic medications  5. AAA: -4.6cm  -Will need outpatient CT surgery referral for semi-annual surveillance   Signed, Kathyrn Drown NP-C [facility] Pager: [phone] 08/21/2017, 9:04 AM     For questions or updates, please contact   Please consult [email or web address] for contact info under Cardiology/STEMI.   I have seen, examined and evaluated the patient this AM on morning rounds along with Kathyrn Drown, NP.  After reviewing all the available data and chart, we discussed the patients laboratory, study & physical findings as well as symptoms in detail. I agree with her findings, examination as well as impression recommendations as per our discussion.    Attending adjustments noted in italics.   Patient presented with ACS presentation/unstable angina with mild troponin elevations -> he may very well have a small non-STEMI, but however was found to have a severe thrombotic lesion in the proximal circumflex. Unfortunately during PCI he did have distal embolization down the adjacent ramus intermedius. He did have prolonged chest pain during and after the procedure and had positive troponins consistent with a Type 4a NSTEMI with elevated troponin levels greater than 3.8. ->> Protocol is to monitor for 1 more day to monitor for signs and symptoms of arrhythmias or mechanical dysfunction..  On dual antiplatelet therapy with aspirin and Brilinta. On metoprolol and Cozaar, I agree with titrating up to home dose by discharge.  He had a panic attack last night, and ask for something to help him sleep.  I expect that he will be ready for discharge tomorrow.  Glenetta Hew, M.D., M.S. Interventional Cardiologist   Pager #  [phone] Phone # [phone] [address] [place], [place] [address]

## 2017-08-22 DIAGNOSIS — Z955 Presence of coronary angioplasty implant and graft: Secondary | ICD-10-CM

## 2017-08-22 LAB — BASIC METABOLIC PANEL
ANION GAP: 10 (ref 5–15)
BUN: 11 mg/dL (ref 6–20)
CHLORIDE: 105 mmol/L (ref 101–111)
CO2: 24 mmol/L (ref 22–32)
Calcium: 9.5 mg/dL (ref 8.9–10.3)
Creatinine, Ser: 1.25 mg/dL — ABNORMAL HIGH (ref 0.61–1.24)
GFR calc Af Amer: >60 mL/min (ref >60)
GFR, EST NON AFRICAN AMERICAN: 59 mL/min — AB (ref >60)
GLUCOSE: 104 mg/dL — AB (ref 65–99)
POTASSIUM: 4 mmol/L (ref 3.5–5.1)
Sodium: 139 mmol/L (ref 135–145)

## 2017-08-22 MED ORDER — ASPIRIN 81 MG PO TBEC: 81.0000 mg | DELAYED_RELEASE_TABLET | Freq: Every day | ORAL | 0 refills | Status: DC

## 2017-08-22 MED ORDER — TICAGRELOR 90 MG PO TABS: 90.0000 mg | ORAL_TABLET | Freq: Two times a day (BID) | ORAL | 0 refills | Status: DC

## 2017-08-22 MED ORDER — NITROGLYCERIN 0.4 MG SL SUBL: 0.4000 mg | SUBLINGUAL_TABLET | SUBLINGUAL | 0 refills | Status: DC | PRN

## 2017-08-22 MED FILL — BRILINTA 90 MG TABLET: 90 | 30 days supply | Qty: 60 | Fill #0

## 2017-08-22 MED FILL — NITROSTAT 0.4 MG TABLET SL: 0.4 | 30 days supply | Qty: 25 | Fill #0

## 2017-08-22 NOTE — Progress Notes (Signed)
CARDIAC REHAB PHASE I   PRE:  Rate/Rhythm: 104 ST  BP:  Supine:   Sitting: 120/88  Standing:   SaO2:   MODE:  Ambulation: 1000 ft   POST:  Rate/Rhythm: 115 ST  BP:  Supine:   Sitting: 122/88  Standing:    SaO2: 0915-0930 Pt walked 1000 ft with steady gait. No CP.  Graylon Good, RN BSN  08/22/2017 9:25 AM

## 2017-08-22 NOTE — Progress Notes (Signed)
Progress Note  Patient Name: [name] Michael Date of Encounter: 08/22/2017  Primary Cardiologist: Glenetta Hew, MD  Subjective   Pt feeling well this AM. Denies chest pain. Walking with cardiac rehab. Will keep one more night given his jump in trop level intra-procedure.   Inpatient Medications    Scheduled Meds: . aspirin EC  81 mg Oral Daily  . hydroxychloroquine  200 mg Oral BID  . losartan  50 mg Oral Daily  . metoprolol tartrate  25 mg Oral BID  . pantoprazole  40 mg Oral Daily  . ticagrelor  90 mg Oral BID   Continuous Infusions: . sodium chloride     PRN Meds: sodium chloride, acetaminophen, clonazePAM, hydrALAZINE, morphine injection, nitroGLYCERIN, ondansetron (ZOFRAN) IV, sodium chloride flush, zolpidem   Vital Signs    Vitals:   08/21/17 2020 08/22/17 0510 08/22/17 0639 08/22/17 0743  BP: (!) 120/94 (!) 137/100 (!) 150/90 121/79  Pulse: 79 79 84 100  Resp: 16 18  (!) 24  Temp: 97.9 F (36.6 C) 98 F (36.7 C)  97.7 F (36.5 C)  TempSrc: Axillary Oral  Oral  SpO2: 96% 97% 98% 95%  Weight:  216 lb 14.9 oz (98.4 kg)    Height:        Intake/Output Summary (Last 24 hours) at 08/22/2017 0855 Last data filed at 08/22/2017 0745 Gross per 24 hour  Intake 790 ml  Output 400 ml  Net 390 ml   Filed Weights   08/20/17 0924 08/21/17 0418 08/22/17 0510  Weight: 214 lb 12.8 oz (97.4 kg) 213 lb 13.5 oz (97 kg) 216 lb 14.9 oz (98.4 kg)    Physical Exam   General: Well developed, well nourished, NAD Skin: Warm, dry, intact  Head: Normocephalic, atraumatic, clear, moist mucus membranes. Neck: Negative for carotid bruits. No JVD Lungs:Clear to ausculation bilaterally. No wheezes, rales, or rhonchi. Breathing is unlabored. Cardiovascular: RRR with S1 S2. No murmurs, rubs, gallops, or LV heave appreciated. Abdomen: Soft, non-tender, non-distended with normoactive bowel sounds. No obvious abdominal masses. MSK: Strength and tone appear normal for age. 5/5 in  all extremities Extremities: No edema. No clubbing or cyanosis. DP/PT pulses 2+ bilaterally Neuro: Alert and oriented. No focal deficits. No facial asymmetry. MAE spontaneously. Psych: Responds to questions appropriately with normal affect.    Labs    Chemistry Recent Labs  Lab 08/20/17 0157 08/21/17 0400 08/22/17 0501  NA 138 140 139  K 4.4 4.3 4.0  CL 103 107 105  CO2 23 23 24   GLUCOSE 132* 115* 104*  BUN 16 10 11   CREATININE 1.46* 1.24 1.25*  CALCIUM 9.4 9.2 9.5  GFRNONAA 49* 60* 59*  GFRAA 57* >60 >60  ANIONGAP 12 10 10      Hematology Recent Labs  Lab 08/20/17 0157 08/20/17 1536 08/21/17 0400  WBC 8.7 8.5 10.1  RBC 4.50 4.10* 4.27  HGB 13.2 11.8* 12.3*  HCT 40.5 36.8* 38.5*  MCV 90.0 89.8 90.2  MCH 29.3 28.8 28.8  MCHC 32.6 32.1 31.9  RDW 12.8 13.2 12.9  PLT 292 284 314    Cardiac Enzymes Recent Labs  Lab 08/20/17 0919 08/20/17 1536 08/20/17 2104  TROPONINI 0.19* 0.36* 3.38*    Recent Labs  Lab 08/20/17 0237 08/20/17 0525  TROPIPOC 0.09* 0.11*     BNPNo results for input(s): BNP, PROBNP in the last 168 hours.   DDimer No results for input(s): DDIMER in the last 168 hours.   Radiology    No results  found.  Telemetry    08/21/17 NSR with RBBB - Personally Reviewed  ECG    08/20/17 Sinus rhythm with RBBB, LVH - Personally Reviewed  Cardiac Studies   Cardiac cath 08/20/17:   Ost Cx to Prox Cx lesion is 90% stenosed.  A drug-eluting stent was successfully placed using a STENT SYNERGY DES 3.5X16.  Post intervention, there is a 0% residual stenosis.  Prox RCA lesion is 50% stenosed.  Mid RCA lesion is 50% stenosed.  1. Severe single vessel CAD involving the ostial/proximal circumflex treated with PTCA and DES implantation (3.5x16 mm Synergy DES) 2. Diffuse coronary ectasia involving the LAD, circumflex, and RCA 3. PCI complicated by distal embolization into the ramus intermediate branches, treated with tirofiban and IC  verapamil, pt hemodynamically stable with mild chest discomfort at procedure completion  Recommend: Tirofiban x 18 hours, DAPT with ASA and ticagrelor at least 12 months, consider long-term DAPT in setting multivessel ectasia. Will order echo for assessment of LV function.  Echo 08/21/17: LVEF 45-50% with mild diffuse hypokinesis more pronounced in the basal and mid inferolateral and anterolateral walls.   Patient Profile     67 y.o. male with a hx of rheumatoid arthritis, nonobstructive coronary disease by cath in 2009, history of PE, hypertension, hyperlipidemia, thoracic aortic aneurysm and a history of a tachycardia treated with a beta-blocker who is followed by cardiology for unstable angina  s/p cath and PCI to the prox LCx 08/20/17.   Assessment & Plan    1.  ACS/unstable Angina/ obstructive CAD s/p PTCA to the prox LCx: -- Type 4a MI/non-STEMI resulting from distal embolization of thrombus down bifurcating ramus intermedius. -PTCA to the LCx 08/20/17 --> with distal embolization.  Excellent angiographic result in the circumflex.   final angiography also showed flow almost down the entire ramus distribution -No further CP overnight -monitored overnight due to positive troponins. -Given distal embolization into the ramus intra procedure and jump in troponin level, will keep one additional night  -On ASA, BB (can likely titrate up beta-blocker to 50 mg twice daily on outpatient follow-up) with statin intolerance, to be referred to lipid clinic at discharge for PCSK9 -Cardiac rehab today -Echocardiogram not unexpectedly shows mildly reduced EF with inferolateral wall motion normality.  There is also some hypokinesis likely from the ramus infarct.  2. HTN: Blood pressure better with the increased dose of losartan.  Can probably titrate up beta-blocker in the outpatient setting along with the Cozaar to home dose of 100 mg.  3. HLD: Statin intolerance.  referred to lipid clinic at discharge  for PCSK9 -Cho-179, HDL-31, LDL-116, Trig-159  4. AKI: Mild bump in creatinine now back to baseline.  Stable.  Okay to have restarted ARB.  Can likely increase back to home dose of 100 mg on close follow-up.  5. AAA:  Will need outpatient CT surgery referral for semi-annual surveillance -4.6cm  -   Signed, Glenetta Hew, M.D., M.S. Interventional Cardiologist   Pager # [phone] Phone # [phone] [address] [place], [place] [address]  08/22/2017, 8:55 AM     For questions or updates, please contact   Please consult [email or web address] for contact info under Cardiology/STEMI.

## 2017-08-22 NOTE — Discharge Summary (Signed)
Physician Discharge Summary  [name] Michael NLG:[id number] DOB: 30-Mar-1951 DOA: 08/20/2017  PCP: Biagio Borg, MD  Admit date: 08/20/2017 Discharge date: 08/22/2017  Admitted From:home Disposition:home  Recommendations for Outpatient Follow-up:  1. Follow up with PCP in 1-2 weeks 2. Please obtain BMP/CBC in one week  Home Health:no Equipment/Devices:no Discharge Condition:stable CODE STATUS:full code Diet recommendation:heart healthy  Brief/Interim Summary: 67 year old male with history of interstitial lung disease, obesity, hypertension, hyperlipidemia, nonobstructive CAD in 2009 depression presented with chest pain.  Found to have elevated troponin status post cardiac cath with a stent placement.  #Chest pain/non-STEMI: Status post cardiac catheter with drug-eluting stent placement.  Continue aspirin, Brilinta.  Refer to lipid clinic outpatient as per cardiologist.  Continue current care.    #Presence of drug coated stent in left circumflex coronary artery Non-ST elevation (NSTEMI) myocardial infarction ([place]) -TYPE 4A peri-PCI  #Hypertension: Currently on low-dose of Cozaar, metoprolol.  Monitor blood pressure.  #Hyperlipidemia: LDL 116.  Not tolerating statin as per cardiology.  Follow-up with cardiology.  #Acute kidney injury on admission: Serum creatinine level trending down.  Monitor BMP.    Does not have urinary symptoms.  Recommended outpatient lab monitoring.  #AAA: 4.6 cm.  Recommend outpatient follow-up with surgeon.  Discussed with Dr. Ellyn Hack today.  Patient is clinically stable.  No chest pain or shortness of breath.  Doing well.  Ready to go home.  He understands outpatient follow-up.  Discharge Diagnoses:  Principal Problem:   AAA (abdominal aortic aneurysm) (HCC) Active Problems:   Hyperlipidemia   OBESITY   Essential hypertension   Aneurysm of thoracic aorta (HCC)   Rheumatoid arthritis (HCC)   Chest pain with high risk of acute coronary syndrome    Coronary artery disease involving native coronary artery of native heart with unstable angina pectoris (HCC)   ACS (acute coronary syndrome) (HCC)   Hyperglycemia   Presence of drug coated stent in left circumflex coronary artery   Non-ST elevation (NSTEMI) myocardial infarction ([name]) -TYPE 4A peri-PCI   AKI (acute kidney injury) [facility])    Discharge Instructions  Discharge Instructions    Amb Referral to Cardiac Rehabilitation   Complete by:  As directed    Diagnosis:   NSTEMI Coronary Stents     Call MD for:  difficulty breathing, headache or visual disturbances   Complete by:  As directed    Call MD for:  extreme fatigue   Complete by:  As directed    Call MD for:  hives   Complete by:  As directed    Call MD for:  persistant dizziness or light-headedness   Complete by:  As directed    Call MD for:  persistant nausea and vomiting   Complete by:  As directed    Call MD for:  severe uncontrolled pain   Complete by:  As directed    Call MD for:  temperature >100.4   Complete by:  As directed    Diet - low sodium heart healthy   Complete by:  As directed    Increase activity slowly   Complete by:  As directed      Allergies as of 08/22/2017      Reactions   Atorvastatin    REACTION: myalgias   Ciprofloxacin Other (See Comments)   myalgias   Crestor [rosuvastatin]    myalgias   Doxycycline    Hydroxychloroquine Sulfate    REACTION: GI upset and H, pt is currently taking and still has some GI problems but is  tolerating it      Medication List    TAKE these medications   aspirin 81 MG EC tablet Take 1 tablet (81 mg total) by mouth daily.   clonazePAM 1 MG tablet Commonly known as:  KLONOPIN TAKE 1 TABLET BY MOUTH TWICE A DAY IF NEEDED FOR ANXIETY   hydroxychloroquine 200 MG tablet Commonly known as:  PLAQUENIL Take 200 mg by mouth 2 (two) times daily.   leflunomide 20 MG tablet Commonly known as:  ARAVA Take 20 mg by mouth daily.   losartan 100 MG  tablet Commonly known as:  COZAAR Take 1 tablet (100 mg total) by mouth daily.   metoprolol tartrate 25 MG tablet Commonly known as:  LOPRESSOR TAKE 1 TABLET BY MOUTH TWICE A DAY   nitroGLYCERIN 0.4 MG SL tablet Commonly known as:  NITROSTAT Place 1 tablet (0.4 mg total) under the tongue every 5 (five) minutes as needed for chest pain (CP or SOB).   omeprazole 20 MG capsule Commonly known as:  PRILOSEC TAKE ONE CAPSULE BY MOUTH TWICE DAILY   ticagrelor 90 MG Tabs tablet Commonly known as:  BRILINTA Take 1 tablet (90 mg total) by mouth 2 (two) times daily.   zolpidem 12.5 MG CR tablet Commonly known as:  AMBIEN CR TAKE ONE TABLET BY MOUTH AT BEDTIME AS NEEDED      Follow-up Information    [facility] Follow up on 08/25/2017.   Why:  2pm for hospital follow up  Contact information: [place] [phone]       Lendon Colonel, NP Follow up on 09/01/2017.   Specialties:  Nurse Practitioner, Radiology, Cardiology Why:  Your appointment is on 09/01/17 at 0930 with Jory Sims, Nurse Practitioner with Dr. Ellyn Hack. Please arrive to your appointment at 0915. Thank you.  Contact information: [place] [address] [phone]          Allergies  Allergen Reactions  . Atorvastatin     REACTION: myalgias  . Ciprofloxacin Other (See Comments)    myalgias  . Crestor [Rosuvastatin]     myalgias  . Doxycycline   . Hydroxychloroquine Sulfate     REACTION: GI upset and H, pt is currently taking and still has some GI problems but is tolerating it    Consultations:   Procedures/Studies:   Subjective:   Discharge Exam: Vitals:   08/22/17 0639 08/22/17 0743  BP: (!) 150/90 121/79  Pulse: 84 100  Resp:  (!) 24  Temp:  97.7 F (36.5 C)  SpO2: 98% 95%   Vitals:   08/21/17 2020 08/22/17 0510 08/22/17 0639 08/22/17 0743  BP: (!) 120/94 (!) 137/100 (!)  150/90 121/79  Pulse: 79 79 84 100  Resp: 16 18  (!) 24  Temp: 97.9 F (36.6 C) 98 F (36.7 C)  97.7 F (36.5 C)  TempSrc: Axillary Oral  Oral  SpO2: 96% 97% 98% 95%  Weight:  98.4 kg (216 lb 14.9 oz)    Height:        General: Pt is alert, awake, not in acute distress Cardiovascular: RRR, S1/S2 +, no rubs, no gallops Respiratory: CTA bilaterally, no wheezing, no rhonchi Abdominal: Soft, NT, ND, bowel sounds + Extremities: no edema, no cyanosis    The results of significant diagnostics from this hospitalization (including imaging, microbiology, ancillary and laboratory) are listed below for reference.     Microbiology: No results found for this or  any previous visit (from the past 240 hour(s)).   Labs: BNP (last 3 results) No results for input(s): BNP in the last 8760 hours. Basic Metabolic Panel: Recent Labs  Lab 08/20/17 0157 08/21/17 0400 08/22/17 0501  NA 138 140 139  K 4.4 4.3 4.0  CL 103 107 105  CO2 23 23 24   GLUCOSE 132* 115* 104*  BUN 16 10 11   CREATININE 1.46* 1.24 1.25*  CALCIUM 9.4 9.2 9.5   Liver Function Tests: No results for input(s): AST, ALT, ALKPHOS, BILITOT, PROT, ALBUMIN in the last 168 hours. No results for input(s): LIPASE, AMYLASE in the last 168 hours. No results for input(s): AMMONIA in the last 168 hours. CBC: Recent Labs  Lab 08/20/17 0157 08/20/17 1536 08/21/17 0400  WBC 8.7 8.5 10.1  HGB 13.2 11.8* 12.3*  HCT 40.5 36.8* 38.5*  MCV 90.0 89.8 90.2  PLT 292 284 314   Cardiac Enzymes: Recent Labs  Lab 08/20/17 0919 08/20/17 1536 08/20/17 2104  CKTOTAL 126  --   --   TROPONINI 0.19* 0.36* 3.38*   BNP: Invalid input(s): POCBNP CBG: No results for input(s): GLUCAP in the last 168 hours. D-Dimer No results for input(s): DDIMER in the last 72 hours. Hgb A1c Recent Labs    08/20/17 0919  HGBA1C 5.2   Lipid Profile Recent Labs    08/21/17 0400  CHOL 179  HDL 31*  LDLCALC 116*  TRIG 159*  CHOLHDL 5.8    Thyroid function studies Recent Labs    08/20/17 0919  TSH 1.466   Anemia work up No results for input(s): VITAMINB12, FOLATE, FERRITIN, TIBC, IRON, RETICCTPCT in the last 72 hours. Urinalysis    Component Value Date/Time   COLORURINE YELLOW 04/03/2017 1156   APPEARANCEUR CLEAR 04/03/2017 1156   LABSPEC <=1.005 (A) 04/03/2017 1156   PHURINE 6.0 04/03/2017 1156   GLUCOSEU NEGATIVE 04/03/2017 1156   HGBUR NEGATIVE 04/03/2017 [place] 04/03/2017 1156   KETONESUR NEGATIVE 04/03/2017 1156   PROTEINUR NEGATIVE 10/26/2015 1029   UROBILINOGEN 0.2 04/03/2017 1156   NITRITE NEGATIVE 04/03/2017 1156   LEUKOCYTESUR NEGATIVE 04/03/2017 1156   Sepsis Labs Invalid input(s): PROCALCITONIN,  WBC,  LACTICIDVEN Microbiology No results found for this or any previous visit (from the past 240 hour(s)).   Time coordinating discharge: 28 minutes  SIGNED:   Rosita Fire, MD  [facility] 08/22/2017, 9:01 AM  If 7PM-7AM, please contact night-coverage [email or web address] Password [email or web address]

## 2017-08-25 ENCOUNTER — Ambulatory Visit: Payer: Self-pay | Attending: Family Medicine | Admitting: Family Medicine

## 2017-08-25 ENCOUNTER — Encounter: Payer: Self-pay | Admitting: Family Medicine

## 2017-08-25 VITALS — BP 97/67 | HR 90 | Temp 98.6°F | Resp 18 | Ht 72.0 in | Wt 211.0 lb

## 2017-08-25 DIAGNOSIS — Z8711 Personal history of peptic ulcer disease: Secondary | ICD-10-CM

## 2017-08-25 DIAGNOSIS — Z86711 Personal history of pulmonary embolism: Secondary | ICD-10-CM | POA: Insufficient documentation

## 2017-08-25 DIAGNOSIS — I2510 Atherosclerotic heart disease of native coronary artery without angina pectoris: Secondary | ICD-10-CM | POA: Insufficient documentation

## 2017-08-25 DIAGNOSIS — E669 Obesity, unspecified: Secondary | ICD-10-CM | POA: Insufficient documentation

## 2017-08-25 DIAGNOSIS — I252 Old myocardial infarction: Secondary | ICD-10-CM

## 2017-08-25 DIAGNOSIS — Z7982 Long term (current) use of aspirin: Secondary | ICD-10-CM | POA: Insufficient documentation

## 2017-08-25 DIAGNOSIS — F329 Major depressive disorder, single episode, unspecified: Secondary | ICD-10-CM | POA: Insufficient documentation

## 2017-08-25 DIAGNOSIS — Z79899 Other long term (current) drug therapy: Secondary | ICD-10-CM | POA: Insufficient documentation

## 2017-08-25 DIAGNOSIS — R079 Chest pain, unspecified: Secondary | ICD-10-CM | POA: Insufficient documentation

## 2017-08-25 DIAGNOSIS — I10 Essential (primary) hypertension: Secondary | ICD-10-CM

## 2017-08-25 DIAGNOSIS — E785 Hyperlipidemia, unspecified: Secondary | ICD-10-CM | POA: Insufficient documentation

## 2017-08-25 DIAGNOSIS — Z09 Encounter for follow-up examination after completed treatment for conditions other than malignant neoplasm: Secondary | ICD-10-CM

## 2017-08-25 MED ORDER — METOPROLOL TARTRATE 25 MG PO TABS: 25.0000 mg | ORAL_TABLET | Freq: Two times a day (BID) | ORAL | 2 refills | Status: DC

## 2017-08-25 MED ORDER — ASPIRIN 81 MG PO TBEC: 81.0000 mg | DELAYED_RELEASE_TABLET | Freq: Every day | ORAL | 11 refills | Status: AC

## 2017-08-25 MED ORDER — OMEPRAZOLE 20 MG PO CPDR: 20.0000 mg | DELAYED_RELEASE_CAPSULE | Freq: Two times a day (BID) | ORAL | 11 refills | Status: DC

## 2017-08-25 MED ORDER — TICAGRELOR 90 MG PO TABS: 90.0000 mg | ORAL_TABLET | Freq: Two times a day (BID) | ORAL | 11 refills | Status: DC

## 2017-08-25 MED ORDER — LOSARTAN POTASSIUM 100 MG PO TABS: 100.0000 mg | ORAL_TABLET | Freq: Every day | ORAL | 3 refills | Status: DC

## 2017-08-25 MED FILL — METOPROLOL TARTRATE 25 MG T: 25 | 30 days supply | Qty: 60 | Fill #0

## 2017-08-25 MED FILL — LOSARTAN POTASSIUM 100 MG T: 100 | 30 days supply | Qty: 30 | Fill #0

## 2017-08-25 MED FILL — ?OMEPRAZOLE DR 20MG CAPSULE: 20 | 30 days supply | Qty: 60 | Fill #0

## 2017-08-25 NOTE — Progress Notes (Signed)
Subjective:  Patient ID: [name] Michael, male    DOB: 12-24-1950  Age: 67 y.o. MRN: [id number]  CC: Hospitalization Follow-up   HPI [name] Michael is a 67 yo male who presents for hospital  follow up. Recent history of hospitalization 08/20/2017-08/22/2017 for ACS. PMH signification for restrictive lung disease, obesity, htn, hld, ra,  pulmonary embolism, non-obstructive CAD, and depression. He presented to the ED on 08/20/17 with c/o chest pain with radiation to left jaw with bilateral arm pain. History of chest pain and arm pain previously on Sunday but took 81 mg aspirin and metoprolol resolved after an hour, however pain returned and patient . Troponin's elevated. EKG showed findings consistent with NSTEMI. Cardiac catheterization performed on 08/20/17 showed severe single vessel CAD involving the ostial/ proximal circumflex treated with PTCA and DES. He is s/p stent placement. DAPT of ASA and Brilinta. Plan to refer to lipid clinic outpatient per cardiology due to patient not tolerating statin. CT angio chest performed and showed mild cardiomegaly. Multi vessel coronary vascular calcification and mildly dilated ascending aorta measuring  4.6cm it was recommend he follow up with CTS.History of AKI on admission prior to d/c serum creatinine trending down. History of hypertension. He is exercising and is adherent to low salt diet.He reports now walking up to 7 minutes BID. He does not check BP at home. Patient denies chest pain, chest pressure/discomfort, claudication, irregular heart beat and palpitations.  Cardiovascular risk factors: dyslipidemia, hypertension and male gender. He is a nonsmoker. History of target organ damage: angina/ prior myocardial infarction. History of PE patient reports in 2009. He reports completing course of anti-coagulant therapy after a few months. He denies any PE or DVT since .He denies any chest pain, acute dyspnea, coughing, or hemoptysis.      Outpatient Medications  Prior to Visit  Medication Sig Dispense Refill  . clonazePAM (KLONOPIN) 1 MG tablet TAKE 1 TABLET BY MOUTH TWICE A DAY IF NEEDED FOR ANXIETY  60 tablet 4  . hydroxychloroquine (PLAQUENIL) 200 MG tablet Take 200 mg by mouth 2 (two) times daily.     Marland Kitchen leflunomide (ARAVA) 20 MG tablet Take 20 mg by mouth daily.      . nitroGLYCERIN (NITROSTAT) 0.4 MG SL tablet Place 1 tablet (0.4 mg total) under the tongue every 5 (five) minutes as needed for chest pain (CP or SOB). 30 tablet 0  . zolpidem (AMBIEN CR) 12.5 MG CR tablet TAKE ONE TABLET BY MOUTH AT BEDTIME AS NEEDED  90 tablet 1  . aspirin EC 81 MG EC tablet Take 1 tablet (81 mg total) by mouth daily. 30 tablet 0  . losartan (COZAAR) 100 MG tablet Take 1 tablet (100 mg total) by mouth daily. 90 tablet 3  . metoprolol tartrate (LOPRESSOR) 25 MG tablet TAKE 1 TABLET BY MOUTH TWICE A DAY 120 tablet 2  . omeprazole (PRILOSEC) 20 MG capsule TAKE ONE CAPSULE BY MOUTH TWICE DAILY  60 capsule 11  . ticagrelor (BRILINTA) 90 MG TABS tablet Take 1 tablet (90 mg total) by mouth 2 (two) times daily. 60 tablet 0   No facility-administered medications prior to visit.     ROS Review of Systems  Constitutional: Negative.   Eyes: Negative.   Respiratory: Negative.   Cardiovascular: Negative.   Gastrointestinal: Negative.   Skin: Negative.   Neurological: Negative for dizziness and syncope.   Objective:  BP 97/67 (BP Location: Right Arm, Patient Position: Sitting, Cuff Size: Large)   Pulse 90   Temp 98.6  F (37 C) (Oral)   Resp 18   Ht 6' (1.829 m)   Wt 211 lb (95.7 kg)   SpO2 96%   BMI 28.62 kg/m   BP/Weight 08/25/2017 08/22/2017 7/68/1157  Systolic BP 97 262 035  Diastolic BP 67 79 90  Wt. (Lbs) 211 216.93 211  BMI 28.62 29.42 29.43    Physical Exam  Constitutional: He is oriented to person, place, and time. He appears well-developed and well-nourished.  Eyes: Conjunctivae are normal. Pupils are equal, round, and reactive to light.  Neck: No  JVD present.  Cardiovascular: Normal rate, regular rhythm, normal heart sounds and intact distal pulses.  Pulmonary/Chest: Effort normal and breath sounds normal.  Abdominal: Soft. Bowel sounds are normal. There is no tenderness.  Musculoskeletal: Normal range of motion.  Neurological: He is alert and oriented to person, place, and time.  Skin: Skin is warm and dry.  Psychiatric: He has a normal mood and affect.  Nursing note and vitals reviewed.    Assessment & Plan:   1. Hospital discharge follow-up  - CBC - Basic metabolic panel  2. Essential hypertension Controlled on current regimen.  - losartan (COZAAR) 100 MG tablet; Take 1 tablet (100 mg total) by mouth daily.  Dispense: 90 tablet; Refill: 3 - metoprolol tartrate (LOPRESSOR) 25 MG tablet; Take 1 tablet (25 mg total) by mouth 2 (two) times daily.  Dispense: 120 tablet; Refill: 2  3. History of non-ST elevation myocardial infarction (NSTEMI) Patient and his wife express concenrs over medication cost. Medications sent to to [facility] pharmacy. Follow up with cardiology appointments - aspirin 81 MG EC tablet; Take 1 tablet (81 mg total) by mouth daily.  Dispense: 30 tablet; Refill: 11 - losartan (COZAAR) 100 MG tablet; Take 1 tablet (100 mg total) by mouth daily.  Dispense: 90 tablet; Refill: 3 - metoprolol tartrate (LOPRESSOR) 25 MG tablet; Take 1 tablet (25 mg total) by mouth 2 (two) times daily.  Dispense: 120 tablet; Refill: 2 - ticagrelor (BRILINTA) 90 MG TABS tablet; Take 1 tablet (90 mg total) by mouth 2 (two) times daily.  Dispense: 60 tablet; Refill: 11  4. History of peptic ulcer disease  - omeprazole (PRILOSEC) 20 MG capsule; Take 1 capsule (20 mg total) by mouth 2 (two) times daily.  Dispense: 60 capsule; Refill: 11      Follow-up: Return in about 3 months (around 11/23/2017) for HTN.   Alfonse Spruce FNP

## 2017-08-25 NOTE — Patient Instructions (Signed)
Managing Your Hypertension Hypertension is commonly called high blood pressure. This is when the force of your blood pressing against the walls of your arteries is too strong. Arteries are blood vessels that carry blood from your heart throughout your body. Hypertension forces the heart to work harder to pump blood, and may cause the arteries to become narrow or stiff. Having untreated or uncontrolled hypertension can cause heart attack, stroke, kidney disease, and other problems. What are blood pressure readings? A blood pressure reading consists of a higher number over a lower number. Ideally, your blood pressure should be below 120/80. The first ("top") number is called the systolic pressure. It is a measure of the pressure in your arteries as your heart beats. The second ("bottom") number is called the diastolic pressure. It is a measure of the pressure in your arteries as the heart relaxes. What does my blood pressure reading mean? Blood pressure is classified into four stages. Based on your blood pressure reading, your health care provider may use the following stages to determine what type of treatment you need, if any. Systolic pressure and diastolic pressure are measured in a unit called mm Hg. Normal  Systolic pressure: below 120.  Diastolic pressure: below 80. Elevated  Systolic pressure: 120-129.  Diastolic pressure: below 80. Hypertension stage 1  Systolic pressure: 130-139.  Diastolic pressure: 80-89. Hypertension stage 2  Systolic pressure: 140 or above.  Diastolic pressure: 90 or above. What health risks are associated with hypertension? Managing your hypertension is an important responsibility. Uncontrolled hypertension can lead to:  A heart attack.  A stroke.  A weakened blood vessel (aneurysm).  Heart failure.  Kidney damage.  Eye damage.  Metabolic syndrome.  Memory and concentration problems.  What changes can I make to manage my  hypertension? Hypertension can be managed by making lifestyle changes and possibly by taking medicines. Your health care provider will help you make a plan to bring your blood pressure within a normal range. Eating and drinking  Eat a diet that is high in fiber and potassium, and low in salt (sodium), added sugar, and fat. An example eating plan is called the DASH (Dietary Approaches to Stop Hypertension) diet. To eat this way: ? Eat plenty of fresh fruits and vegetables. Try to fill half of your plate at each meal with fruits and vegetables. ? Eat whole grains, such as whole wheat pasta, brown rice, or whole grain bread. Fill about one quarter of your plate with whole grains. ? Eat low-fat diary products. ? Avoid fatty cuts of meat, processed or cured meats, and poultry with skin. Fill about one quarter of your plate with lean proteins such as fish, chicken without skin, beans, eggs, and tofu. ? Avoid premade and processed foods. These tend to be higher in sodium, added sugar, and fat.  Reduce your daily sodium intake. Most people with hypertension should eat less than 1,500 mg of sodium a day.  Limit alcohol intake to no more than 1 drink a day for nonpregnant women and 2 drinks a day for men. One drink equals 12 oz of beer, 5 oz of wine, or 1 oz of hard liquor. Lifestyle  Work with your health care provider to maintain a healthy body weight, or to lose weight. Ask what an ideal weight is for you.  Get at least 30 minutes of exercise that causes your heart to beat faster (aerobic exercise) most days of the week. Activities may include walking, swimming, or biking.  Include exercise   to strengthen your muscles (resistance exercise), such as weight lifting, as part of your weekly exercise routine. Try to do these types of exercises for 30 minutes at least 3 days a week.  Do not use any products that contain nicotine or tobacco, such as cigarettes and e-cigarettes. If you need help quitting, ask  your health care provider.  Control any long-term (chronic) conditions you have, such as high cholesterol or diabetes. Monitoring  Monitor your blood pressure at home as told by your health care provider. Your personal target blood pressure may vary depending on your medical conditions, your age, and other factors.  Have your blood pressure checked regularly, as often as told by your health care provider. Working with your health care provider  Review all the medicines you take with your health care provider because there may be side effects or interactions.  Talk with your health care provider about your diet, exercise habits, and other lifestyle factors that may be contributing to hypertension.  Visit your health care provider regularly. Your health care provider can help you create and adjust your plan for managing hypertension. Will I need medicine to control my blood pressure? Your health care provider may prescribe medicine if lifestyle changes are not enough to get your blood pressure under control, and if:  Your systolic blood pressure is 130 or higher.  Your diastolic blood pressure is 80 or higher.  Take medicines only as told by your health care provider. Follow the directions carefully. Blood pressure medicines must be taken as prescribed. The medicine does not work as well when you skip doses. Skipping doses also puts you at risk for problems. Contact a health care provider if:  You think you are having a reaction to medicines you have taken.  You have repeated (recurrent) headaches.  You feel dizzy.  You have swelling in your ankles.  You have trouble with your vision. Get help right away if:  You develop a severe headache or confusion.  You have unusual weakness or numbness, or you feel faint.  You have severe pain in your chest or abdomen.  You vomit repeatedly.  You have trouble breathing. Summary  Hypertension is when the force of blood pumping through  your arteries is too strong. If this condition is not controlled, it may put you at risk for serious complications.  Your personal target blood pressure may vary depending on your medical conditions, your age, and other factors. For most people, a normal blood pressure is less than 120/80.  Hypertension is managed by lifestyle changes, medicines, or both. Lifestyle changes include weight loss, eating a healthy, low-sodium diet, exercising more, and limiting alcohol. This information is not intended to replace advice given to you by your health care provider. Make sure you discuss any questions you have with your health care provider. Document Released: 04/15/2012 Document Revised: 06/19/2016 Document Reviewed: 06/19/2016 Elsevier Interactive Patient Education  2018 [place] for Gastroesophageal Reflux Disease, Adult When you have gastroesophageal reflux disease (GERD), the foods you eat and your eating habits are very important. Choosing the right foods can help ease your discomfort. What guidelines do I need to follow?  Choose fruits, vegetables, whole grains, and low-fat dairy products.  Choose low-fat meat, fish, and poultry.  Limit fats such as oils, salad dressings, butter, nuts, and avocado.  Keep a food diary. This helps you identify foods that cause symptoms.  Avoid foods that cause symptoms. These may be different for everyone.  Eat small meals often instead of 3 large meals a day.  Eat your meals slowly, in a place where you are relaxed.  Limit fried foods.  Cook foods using methods other than frying.  Avoid drinking alcohol.  Avoid drinking large amounts of liquids with your meals.  Avoid bending over or lying down until 2-3 hours after eating. What foods are not recommended? These are some foods and drinks that may make your symptoms worse: Vegetables Tomatoes. Tomato juice. Tomato and spaghetti sauce. Chili peppers. Onion and garlic.  Horseradish. Fruits Oranges, grapefruit, and lemon (fruit and juice). Meats High-fat meats, fish, and poultry. This includes hot dogs, ribs, ham, sausage, salami, and bacon. Dairy Whole milk and chocolate milk. Sour cream. Cream. Butter. Ice cream. Cream cheese. Drinks Coffee and tea. Bubbly (carbonated) drinks or energy drinks. Condiments Hot sauce. Barbecue sauce. Sweets/Desserts Chocolate and cocoa. Donuts. Peppermint and spearmint. Fats and Oils High-fat foods. This includes [place] fries and potato chips. Other Vinegar. Strong spices. This includes black pepper, white pepper, red pepper, cayenne, curry powder, cloves, ginger, and chili powder. The items listed above may not be a complete list of foods and drinks to avoid. Contact your dietitian for more information. This information is not intended to replace advice given to you by your health care provider. Make sure you discuss any questions you have with your health care provider. Document Released: 01/21/2012 Document Revised: 12/28/2015 Document Reviewed: 05/26/2013 Elsevier Interactive Patient Education  2017 [employer].

## 2017-08-26 ENCOUNTER — Encounter: Payer: Self-pay | Admitting: Adult Health

## 2017-08-26 LAB — BASIC METABOLIC PANEL
BUN / CREAT RATIO: 17 (ref 10–24)
BUN: 26 mg/dL (ref 8–27)
CO2: 22 mmol/L (ref 20–29)
Calcium: 9.6 mg/dL (ref 8.6–10.2)
Chloride: 100 mmol/L (ref 96–106)
Creatinine, Ser: 1.53 mg/dL — ABNORMAL HIGH (ref 0.76–1.27)
GFR calc Af Amer: 55 mL/min/{1.73_m2} — ABNORMAL LOW (ref >59)
GFR calc non Af Amer: 47 mL/min/{1.73_m2} — ABNORMAL LOW (ref >59)
GLUCOSE: 81 mg/dL (ref 65–99)
POTASSIUM: 5.2 mmol/L (ref 3.5–5.2)
SODIUM: 138 mmol/L (ref 134–144)

## 2017-08-26 LAB — CBC
Hematocrit: 38.4 % (ref 37.5–51.0)
Hemoglobin: 13 g/dL (ref 13.0–17.7)
MCH: 28.9 pg (ref 26.6–33.0)
MCHC: 33.9 g/dL (ref 31.5–35.7)
MCV: 85 fL (ref 79–97)
PLATELETS: 418 10*3/uL — AB (ref 150–379)
RBC: 4.5 x10E6/uL (ref 4.14–5.80)
RDW: 13.1 % (ref 12.3–15.4)
WBC: 10.8 10*3/uL (ref 3.4–10.8)

## 2017-08-27 ENCOUNTER — Other Ambulatory Visit: Payer: Self-pay | Admitting: Family Medicine

## 2017-08-27 DIAGNOSIS — Z09 Encounter for follow-up examination after completed treatment for conditions other than malignant neoplasm: Secondary | ICD-10-CM

## 2017-08-27 DIAGNOSIS — R944 Abnormal results of kidney function studies: Secondary | ICD-10-CM

## 2017-09-01 ENCOUNTER — Ambulatory Visit (INDEPENDENT_AMBULATORY_CARE_PROVIDER_SITE_OTHER): Payer: Self-pay | Admitting: Adult Health

## 2017-09-01 ENCOUNTER — Telehealth (HOSPITAL_COMMUNITY): Payer: Self-pay

## 2017-09-01 ENCOUNTER — Encounter: Payer: Self-pay | Admitting: Adult Health

## 2017-09-01 VITALS — BP 102/80 | HR 87 | Ht 72.0 in | Wt 203.6 lb

## 2017-09-01 DIAGNOSIS — I249 Acute ischemic heart disease, unspecified: Secondary | ICD-10-CM

## 2017-09-01 DIAGNOSIS — E7800 Pure hypercholesterolemia, unspecified: Secondary | ICD-10-CM

## 2017-09-01 DIAGNOSIS — I10 Essential (primary) hypertension: Secondary | ICD-10-CM

## 2017-09-01 DIAGNOSIS — I25110 Atherosclerotic heart disease of native coronary artery with unstable angina pectoris: Secondary | ICD-10-CM

## 2017-09-01 NOTE — Telephone Encounter (Signed)
Called to speak with patient in regards to insurance - patient does not have insurance. Offered maintenance program. He would like to speak with his wife about financial situation.

## 2017-09-01 NOTE — Progress Notes (Signed)
Cardiology Office Note   Date:  09/01/2017   ID:  [name] Michael, DOB 30-Apr-1951, MRN [id number]  PCP:  Alfonse Spruce, FNP  Cardiologist: Dr. Ellyn Hack Chief Complaint  Patient presents with  . Follow-up    TOC-Left heart cath/stent  Discuss BP and medication  . Coronary Artery Disease     History of Present Illness: [name] Michael is a 67 y.o. male who presents for posthospitalization follow-up after admission on 08/20/2017 in the setting of coronary artery disease requiring cardiac catheterization.  This revealed severe single-vessel CAD involving the ostial/proximal circumflex treated with PTCA.  He was placed on dual antiplatelet therapy, ARB ,beta-blocker with plans to titrate up to 50 mg twice daily as an outpatient.  The patient was found to be intolerant to statins and is to be referred to the lipid clinic for [place] 9 therapy.  Of note, the patient was found to have a AAA 4.6 cm, and was to be referred to CT surgery for semi-annual surveillance.  He comes today with multiple questions concerning his catheterization, stents, and medications.  He denies recurrent chest pain, dizziness, has chronic dyspnea which is no worse on Brilinta.  He also has significant anxiety and insomnia and is requesting medications to help with that.  He is on a drug assistant program for Brilinta, and has not yet been established with the lipid clinic.  Past Medical History:  Diagnosis Date  . ALLERGIC RHINITIS 04/30/2007  . ANEMIA-NOS 07/12/2007  . ANXIETY 02/17/2009  . Coronary artery disease involving native coronary artery of native heart with unstable angina pectoris ([place]) 05/10/2011  . DEPRESSIVE DISORDER 02/14/2009  . GERD (gastroesophageal reflux disease)   . History of pulmonary embolism 03/09/2008  . HYPERLIPIDEMIA 04/27/2007  . HYPERTENSION 04/27/2007  . INSOMNIA UNSPECIFIED 02/20/2010  . Lupus    "w/RA" (08/20/2017)  . 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 Migraine    "stopped in the early  2000s" (08/20/2017)  . Non-ST elevation (NSTEMI) myocardial infarction ([place]) -TYPE 4A peri-PCI 08/21/2017  . Nonspecific abnormal toxicological findings 03/14/2008  . NSTEMI (non-ST elevated myocardial infarction) ([place]) 08/20/2017  . OBESITY 04/30/2007  . OSTEOARTHRITIS 04/30/2007  . 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 Presence of drug coated stent in left circumflex coronary artery 08/21/2017  . PROSTATITIS, HX OF 04/30/2007  . RESTRICTIVE LUNG DISEASE 06/20/2009  . Rheumatoid arthritis ([place])   . THORACIC AORTIC ANEURYSM 07/06/2009   Chest CT 11/12: ascending thoracic aortic aneurysm stable at 42 mm.   Marland Kitchen UNSPECIFIED MYALGIA AND MYOSITIS 02/14/2009    Past Surgical History:  Procedure Laterality Date  . APPENDECTOMY    . BACK SURGERY    . CARDIAC CATHETERIZATION  06/2008   Non-obstructive CAD.  EF 40-45%. Anterior - apical HK  . CORONARY ANGIOPLASTY WITH STENT PLACEMENT  08/20/2017  . CORONARY STENT INTERVENTION N/A 08/20/2017   Procedure: CORONARY STENT INTERVENTION;  Surgeon: [name], MD;  Location: [facility];  Service: Cardiovascular;  Laterality: N/A;  . LEFT HEART CATH AND CORONARY ANGIOGRAPHY N/A 08/20/2017   Procedure: LEFT HEART CATH AND CORONARY ANGIOGRAPHY;  Surgeon: [name], MD;  Location: [facility];  Service: Cardiovascular;  Laterality: N/A;  . NM MYOVIEW LTD  06/2009   Abnormal stress nuclear study with small prior lateral infarct; no ischemia. Normal EF  . TRANSTHORACIC ECHOCARDIOGRAM  06/2009   55% to 60%. Wall motion was normal.  Mod Dilated [place].  Mild LA dilation.     Current Outpatient Medications  Medication  Sig Dispense Refill  . aspirin 81 MG EC tablet Take 1 tablet (81 mg total) by mouth daily. 30 tablet 11  . clonazePAM (KLONOPIN) 1 MG tablet TAKE 1 TABLET BY MOUTH TWICE A DAY IF NEEDED FOR ANXIETY  60 tablet 4  . hydroxychloroquine (PLAQUENIL) 200 MG tablet Take 200 mg by mouth 2 (two) times daily.     Marland Kitchen leflunomide (ARAVA) 20  MG tablet Take 20 mg by mouth daily.      Marland Kitchen losartan (COZAAR) 100 MG tablet Take 1 tablet (100 mg total) by mouth daily. (Patient taking differently: Take 50 mg by mouth daily. ) 90 tablet 3  . metoprolol tartrate (LOPRESSOR) 25 MG tablet Take 1 tablet (25 mg total) by mouth 2 (two) times daily. 120 tablet 2  . nitroGLYCERIN (NITROSTAT) 0.4 MG SL tablet Place 1 tablet (0.4 mg total) under the tongue every 5 (five) minutes as needed for chest pain (CP or SOB). 30 tablet 0  . omeprazole (PRILOSEC) 20 MG capsule Take 1 capsule (20 mg total) by mouth 2 (two) times daily. 60 capsule 11  . ticagrelor (BRILINTA) 90 MG TABS tablet Take 1 tablet (90 mg total) by mouth 2 (two) times daily. 60 tablet 11  . zolpidem (AMBIEN CR) 12.5 MG CR tablet TAKE ONE TABLET BY MOUTH AT BEDTIME AS NEEDED  90 tablet 1   No current facility-administered medications for this visit.     Allergies:   Atorvastatin; Ciprofloxacin; Crestor [rosuvastatin]; Doxycycline; and Hydroxychloroquine sulfate    Social History:  The patient  reports that  has never smoked. he has never used smokeless tobacco. He reports that he does not drink alcohol or use drugs.   Family History:  The patient's family history includes Alcohol abuse in his other; Diabetes in his brother, brother, father, mother, and other; Heart disease in his brother, father, mother, and other; Hyperlipidemia in his father and mother; Hypertension in his brother, other, and sister; Stroke in his father, mother, and other.    ROS: All other systems are reviewed and negative. Unless otherwise mentioned in H&P    PHYSICAL EXAM: VS:  BP 102/80 (BP Location: Right Arm)   Pulse 87   Ht 6' (1.829 m)   Wt 203 lb 9.6 oz (92.4 kg)   BMI 27.61 kg/m  , BMI Body mass index is 27.61 kg/m. GEN: Well nourished, well developed, in no acute distress  HEENT: normal  Neck: no JVD, carotid bruits, or masses Cardiac: RRR; no murmurs, rubs, or gallops,no edema  Respiratory:  clear  to auscultation bilaterally, normal work of breathing GI: soft, nontender, nondistended, + BS MS: no deformity or atrophy react catheterization insertion site well-healed, no evidence of bleeding or hematoma Skin: warm and dry, no rash Neuro:  Strength and sensation are intact Psych: euthymic mood, full affect   EKG: Normal sinus rhythm, right ventricular hypertrophy, T wave flattening inferior laterally.  Heart rate of 87 bpm.   Recent Labs: 04/03/2017: ALT 30 08/20/2017: TSH 1.466 08/25/2017: BUN 26; Creatinine, Ser 1.53; Hemoglobin 13.0; Platelets 418; Potassium 5.2; Sodium 138    Lipid Panel    Component Value Date/Time   CHOL 179 08/21/2017 0400   TRIG 159 (H) 08/21/2017 0400   TRIG 63 08/15/2006 0721   HDL 31 (L) 08/21/2017 0400   CHOLHDL 5.8 08/21/2017 0400   VLDL 32 08/21/2017 0400   LDLCALC 116 (H) 08/21/2017 0400   LDLDIRECT 131.0 04/03/2017 1156      Wt Readings from Last 3  Encounters:  09/01/17 203 lb 9.6 oz (92.4 kg)  08/25/17 211 lb (95.7 kg)  08/22/17 216 lb 14.9 oz (98.4 kg)      Other studies Reviewed: Cardiac cath 08/20/17:   Ost Cx to Prox Cx lesion is 90% stenosed.  A drug-eluting stent was successfully placed using a STENT SYNERGY DES 3.5X16.  Post intervention, there is a 0% residual stenosis.  Prox RCA lesion is 50% stenosed.  Mid RCA lesion is 50% stenosed.  1. Severe single vessel CAD involving the ostial/proximal circumflex treated with PTCA and DES implantation (3.5x16 mm Synergy DES) 2. Diffuse coronary ectasia involving the LAD, circumflex, and RCA 3. PCI complicated by distal embolization into the ramus intermediate branches, treated with tirofiban and IC verapamil, pt hemodynamically stable with mild chest discomfort at procedure completion  Recommend: Tirofiban x 18 hours, DAPT with ASA and ticagrelor at least 12 months, consider long-term DAPT in setting multivessel ectasia.   Echo 08/21/17: LVEF 45-50% with mild diffuse  hypokinesis more pronounced in the basal and mid inferolateral and anterolateral walls.    ASSESSMENT AND PLAN:  1.  Coronary artery disease: Status post cardiac catheterization requiring PCI and stent placement circumflex artery.  He has nonobstructive disease in the right coronary artery.  He remains on dual antiplatelet with aspirin and Brilinta.  He offers no complaints of bleeding or dyspnea associated with medication.  He has no further chest pain.  The patient will continue on current medication regimen to also include his beta-blocker and ARB.  Multiple questions were answered, explanation of catheterization along with printed illustration is provided to the patient and his wife.  2.  Hypercholesterolemia: Patient is being referred to our lipid clinic for [facility] SK 9 inhibition therapy.  Current medicines are reviewed at length with the patient today.    3.  Insomnia with anxiety: I have asked him to follow-up with his PCP concerning medications.  Etiology will not be treating this.  Labs/ tests ordered today include:   Phill Myron. West Pugh, ANP, AACC   09/01/2017 10:52 AM    [facility] [address] [place], [place] [address] Phone: [phone]; Fax: [phone]

## 2017-09-01 NOTE — Patient Instructions (Signed)
Medication Instructions:  NO CHANGES-Your physician recommends that you continue on your current medications as directed. Please refer to the Current Medication list given to you today.  If you need a refill on your cardiac medications before your next appointment, please call your pharmacy.  Special Instructions: REFER TO LIPID CLINIC FOR REPATHA  Follow-Up: Your physician wants you to follow-up in: [place] should receive a reminder letter in the mail two months in advance. If you do not receive a letter, please call our office MAY 2019 to schedule the July 2019 follow-up appointment.   Thank you for choosing [facility] at [facility]!!

## 2017-09-03 ENCOUNTER — Telehealth (INDEPENDENT_AMBULATORY_CARE_PROVIDER_SITE_OTHER): Payer: Self-pay | Admitting: *Deleted

## 2017-09-03 ENCOUNTER — Telehealth (HOSPITAL_COMMUNITY): Payer: Self-pay

## 2017-09-03 NOTE — Telephone Encounter (Signed)
Patient verified DOB Patient is aware of labs being normal with improving anemia. Patient is aware of adhering to BP medications and not smoking to address the decreased kidney function. Patient is scheduled for Feb 13th at 11:00 for recheck.

## 2017-09-03 NOTE — Telephone Encounter (Signed)
Called to follow up with patient in regards to Maintenance program. Patient is interested. Passed referral to Maintenance coordinator. Closed referral.

## 2017-09-03 NOTE — Telephone Encounter (Signed)
-----   Message from Alfonse Spruce, [place] sent at 08/27/2017  2:31 PM EST ----- Labs that evaluated your fluid and electrolyte balance are normal.  Lab shows improving anemia.  -Kidney function is still decreased. -Continue to take your medications for blood pressure, avoid taking NSAID medications, reduce salt intake to 2 to 4 grams/day, do not smoke. Recommend walk in lab only visit in 2 weeks for recheck.

## 2017-09-11 ENCOUNTER — Ambulatory Visit (INDEPENDENT_AMBULATORY_CARE_PROVIDER_SITE_OTHER): Payer: Self-pay | Admitting: Pharmacist Clinician (PhC)/ Clinical Pharmacy Specialist

## 2017-09-11 DIAGNOSIS — E782 Mixed hyperlipidemia: Secondary | ICD-10-CM

## 2017-09-11 NOTE — Patient Instructions (Signed)
We will submit your paperwork to [employer] and see if we can get Repatha free for the calendar year.  If you have any questions/concerns, please give [place] a call:  Arsalan Brisbin/Raquel at [phone]   Cholesterol Cholesterol is a fat. Your body needs a small amount of cholesterol. Cholesterol (plaque) may build up in your blood vessels (arteries). That makes you more likely to have a heart attack or stroke. You cannot feel your cholesterol level. Having a blood test is the only way to find out if your level is high. Keep your test results. Work with your doctor to keep your cholesterol at a good level. What do the results mean?  Total cholesterol is how much cholesterol is in your blood.  LDL is bad cholesterol. This is the type that can build up. Try to have low LDL.  HDL is good cholesterol. It cleans your blood vessels and carries LDL away. Try to have high HDL.  Triglycerides are fat that the body can store or burn for energy. What are good levels of cholesterol?  Total cholesterol below 200.  LDL below 100 is good for people who have health risks. LDL below 70 is good for people who have very high risks.  HDL above 40 is good. It is best to have HDL of 60 or higher.  Triglycerides below 150. How can I lower my cholesterol? Diet Follow your diet program as told by your doctor.  Choose fish, white meat chicken, or [place] that is roasted or baked. Try not to eat red meat, fried foods, sausage, or lunch meats.  Eat lots of fresh fruits and vegetables.  Choose whole grains, beans, pasta, potatoes, and cereals.  Choose olive oil, corn oil, or canola oil. Only use small amounts.  Try not to eat butter, mayonnaise, shortening, or palm kernel oils.  Try not to eat foods with trans fats.  Choose low-fat or nonfat dairy foods. ? Drink skim or nonfat milk. ? Eat low-fat or nonfat yogurt and cheeses. ? Try not to drink whole milk or cream. ? Try not to eat ice cream, egg yolks, or  full-fat cheeses.  Healthy desserts include angel food cake, ginger snaps, animal crackers, hard candy, popsicles, and low-fat or nonfat frozen yogurt. Try not to eat pastries, cakes, pies, and cookies.  Exercise Follow your exercise program as told by your doctor.  Be more active. Try gardening, walking, and taking the stairs.  Ask your doctor about ways that you can be more active.  Medicine  Take over-the-counter and prescription medicines only as told by your doctor. This information is not intended to replace advice given to you by your health care provider. Make sure you discuss any questions you have with your health care provider. Document Released: 10/18/2008 Document Revised: 02/21/2016 Document Reviewed: 02/01/2016 Elsevier Interactive Patient Education  2018 [place] injection What is this medicine? EVOLOCUMAB (e voe LOK ue mab) is known as a PCSK9 inhibitor. It is used to lower the level of cholesterol in the blood. It may be used alone or in combination with other cholesterol-lowering drugs. This drug may also be used to reduce the risk of heart attack, stroke, and certain types of heart surgery in patients with heart disease. This medicine may be used for other purposes; ask your health care provider or pharmacist if you have questions. COMMON BRAND NAME(S): REPATHA What should I tell my health care provider before I take this medicine? They need to know if you have  any of these conditions: -an unusual or allergic reaction to evolocumab, other medicines, foods, dyes, or preservatives -pregnant or trying to get pregnant -breast-feeding How should I use this medicine? This medicine is for injection under the skin. You will be taught how to prepare and give this medicine. Use exactly as directed. Take your medicine at regular intervals. Do not take your medicine more often than directed. It is important that you put your used needles and syringes in a  special sharps container. Do not put them in a trash can. If you do not have a sharps container, call your pharmacist or health care provider to get one. Talk to your pediatrician regarding the use of this medicine in children. While this drug may be prescribed for children as young as 13 years for selected conditions, precautions do apply. Overdosage: If you think you have taken too much of this medicine contact a poison control center or emergency room at once. NOTE: This medicine is only for you. Do not share this medicine with others. What if I miss a dose? If you miss a dose, take it as soon as you can if there are more than 7 days until the next scheduled dose, or skip the missed dose and take the next dose according to your original schedule. Do not take double or extra doses. What may interact with this medicine? Interactions are not expected. This list may not describe all possible interactions. Give your health care provider a list of all the medicines, herbs, non-prescription drugs, or dietary supplements you use. Also tell them if you smoke, drink alcohol, or use illegal drugs. Some items may interact with your medicine. What should I watch for while using this medicine? You may need blood work while you are taking this medicine. What side effects may I notice from receiving this medicine? Side effects that you should report to your doctor or health care professional as soon as possible: -allergic reactions like skin rash, itching or hives, swelling of the face, lips, or tongue -signs and symptoms of infection like fever or chills; cough; sore throat; pain or trouble passing urine Side effects that usually do not require medical attention (report to your doctor or health care professional if they continue or are bothersome): -diarrhea -nausea -muscle pain -pain, redness, or irritation at site where injected This list may not describe all possible side effects. Call your doctor for  medical advice about side effects. You may report side effects to FDA at 1-800-FDA-1088. Where should I keep my medicine? Keep out of the reach of children. You will be instructed on how to store this medicine. Throw away any unused medicine after the expiration date on the label. NOTE: This sheet is a summary. It may not cover all possible information. If you have questions about this medicine, talk to your doctor, pharmacist, or health care provider.  2018 Elsevier/Gold Standard (2016-07-08 13:21:53)

## 2017-09-11 NOTE — Progress Notes (Signed)
09/12/2017 [name] Michael 11-28-1950 [id number]   HPI:  [name] Michael is a 67 y.o. male patient of Dr Ellyn Hack, who presents today for a lipid clinic evaluation.  He had a NSTEMI, single vessel CAD with DES implantation on January 16.  He was started on dual antiplatelet therapy, but chart indicates he is intolerant to statin drugs, so he presents today for initiation of PCSK-9 inhibitor.    In addition to hyperlipidemia and CAD, his medical history is significant for thoracic aortic aneurysm, migraines, rheumatoid arthritis,  RA, lupus, fibromyalgia and obesity On plaquenil, [place]  Cholesterol Goals:   LDL < 70  Intolerant/previously tried:  Pravastatin 40,. Rosuvastatin 10, atorvastatin (dose unknown), ezetimibe 10  Family history:   Father (60) - CHF  Mother (20)- CHF, fall/internal bleed  1 sister with cholesterol issues  Diet:   Mostly home cooked; no fried foods, some starchy foods  Exercise:    Walks 15 min twice daily  Labs:   08/2017:  TC 179, TG 159, HDL 31, LDL 116  03/2017:  TC 218, TG 236, HDL 33.1, LDL (d) 131   05/2016:  TC 219, TG 135, HDL 42.9, LDL 149  Current Outpatient Medications  Medication Sig Dispense Refill  . aspirin 81 MG EC tablet Take 1 tablet (81 mg total) by mouth daily. 30 tablet 11  . clonazePAM (KLONOPIN) 1 MG tablet TAKE 1 TABLET BY MOUTH TWICE A DAY IF NEEDED FOR ANXIETY  60 tablet 4  . hydroxychloroquine (PLAQUENIL) 200 MG tablet Take 200 mg by mouth 2 (two) times daily.     Marland Kitchen leflunomide (ARAVA) 20 MG tablet Take 20 mg by mouth daily.      Marland Kitchen losartan (COZAAR) 100 MG tablet Take 1 tablet (100 mg total) by mouth daily. (Patient taking differently: Take 50 mg by mouth daily. ) 90 tablet 3  . metoprolol tartrate (LOPRESSOR) 25 MG tablet Take 1 tablet (25 mg total) by mouth 2 (two) times daily. 120 tablet 2  . nitroGLYCERIN (NITROSTAT) 0.4 MG SL tablet Place 1 tablet (0.4 mg total) under the tongue every 5 (five) minutes as needed for chest pain  (CP or SOB). 30 tablet 0  . omeprazole (PRILOSEC) 20 MG capsule Take 1 capsule (20 mg total) by mouth 2 (two) times daily. 60 capsule 11  . ticagrelor (BRILINTA) 90 MG TABS tablet Take 1 tablet (90 mg total) by mouth 2 (two) times daily. 60 tablet 11  . zolpidem (AMBIEN CR) 12.5 MG CR tablet TAKE ONE TABLET BY MOUTH AT BEDTIME AS NEEDED  90 tablet 1   No current facility-administered medications for this visit.     Allergies  Allergen Reactions  . Atorvastatin     REACTION: myalgias  . Ciprofloxacin Other (See Comments)    myalgias  . Crestor [Rosuvastatin]     myalgias  . Doxycycline   . Hydroxychloroquine Sulfate     REACTION: GI upset and H, pt is currently taking and still has some GI problems but is tolerating it    Past Medical History:  Diagnosis Date  . ALLERGIC RHINITIS 04/30/2007  . ANEMIA-NOS 07/12/2007  . ANXIETY 02/17/2009  . Coronary artery disease involving native coronary artery of native heart with unstable angina pectoris ([place]) 05/10/2011  . DEPRESSIVE DISORDER 02/14/2009  . GERD (gastroesophageal reflux disease)   . History of pulmonary embolism 03/09/2008  . HYPERLIPIDEMIA 04/27/2007  . HYPERTENSION 04/27/2007  . INSOMNIA UNSPECIFIED 02/20/2010  . Lupus    "w/RA" (08/20/2017)  . MALLORY-WEISS SYNDROME  04/27/2007  . Migraine    "stopped in the early 2000s" (08/20/2017)  . Non-ST elevation (NSTEMI) myocardial infarction ([place]) -TYPE 4A peri-PCI 08/21/2017  . Nonspecific abnormal toxicological findings 03/14/2008  . NSTEMI (non-ST elevated myocardial infarction) ([place]) 08/20/2017  . OBESITY 04/30/2007  . OSTEOARTHRITIS 04/30/2007  . 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 Presence of drug coated stent in left circumflex coronary artery 08/21/2017  . PROSTATITIS, HX OF 04/30/2007  . RESTRICTIVE LUNG DISEASE 06/20/2009  . Rheumatoid arthritis ([place])   . THORACIC AORTIC ANEURYSM 07/06/2009   Chest CT 11/12: ascending thoracic aortic aneurysm stable at 42 mm.   Marland Kitchen UNSPECIFIED  MYALGIA AND MYOSITIS 02/14/2009    There were no vitals taken for this visit.   Hyperlipidemia Patient with recent NSTEMI and DES with intolerance to multiple statin drugs.  LDL at time of event was 116.  Reviewed PCSK-9i as well as research options with patient in detail.  Patient would like to pursue Repatha [employer] system.  Patient is currently uninsured, so had him fill out paperwork for [employer] today in the office and faxed it directly to them.  Patient was notified that he can call the office once the first dose arrives, and we can work with him on the first dose to ensure he knows how to use.    Tommy Medal PharmD CPP [facility]

## 2017-09-12 ENCOUNTER — Encounter: Payer: Self-pay | Admitting: Pharmacist Clinician (PhC)/ Clinical Pharmacy Specialist

## 2017-09-12 NOTE — Assessment & Plan Note (Signed)
Patient with recent NSTEMI and DES with intolerance to multiple statin drugs.  LDL at time of event was 116.  Reviewed PCSK-9i as well as research options with patient in detail.  Patient would like to pursue Repatha [employer] system.  Patient is currently uninsured, so had him fill out paperwork for [employer] today in the office and faxed it directly to them.  Patient was notified that he can call the office once the first dose arrives, and we can work with him on the first dose to ensure he knows how to use.

## 2017-09-17 ENCOUNTER — Ambulatory Visit: Payer: Self-pay | Attending: Family Medicine

## 2017-09-17 DIAGNOSIS — Z09 Encounter for follow-up examination after completed treatment for conditions other than malignant neoplasm: Secondary | ICD-10-CM

## 2017-09-17 DIAGNOSIS — R944 Abnormal results of kidney function studies: Secondary | ICD-10-CM | POA: Insufficient documentation

## 2017-09-17 MED FILL — ?METOPROLOL 25 MG TABLET: 25 | 30 days supply | Qty: 60 | Fill #1

## 2017-09-17 MED FILL — !BRILINTA 90 MG TABLET: 30 days supply | Qty: 60 | Fill #0

## 2017-09-17 NOTE — Progress Notes (Signed)
Patient here for lab visit only 

## 2017-09-18 LAB — BASIC METABOLIC PANEL
BUN / CREAT RATIO: 12 (ref 10–24)
BUN: 15 mg/dL (ref 8–27)
CHLORIDE: 103 mmol/L (ref 96–106)
CO2: 21 mmol/L (ref 20–29)
CREATININE: 1.28 mg/dL — AB (ref 0.76–1.27)
Calcium: 9.4 mg/dL (ref 8.6–10.2)
GFR calc non Af Amer: 59 mL/min/{1.73_m2} — ABNORMAL LOW (ref >59)
GFR, EST AFRICAN AMERICAN: 68 mL/min/{1.73_m2} (ref >59)
GLUCOSE: 122 mg/dL — AB (ref 65–99)
Potassium: 4.4 mmol/L (ref 3.5–5.2)
Sodium: 141 mmol/L (ref 134–144)

## 2017-09-29 ENCOUNTER — Telehealth: Payer: Self-pay

## 2017-09-29 NOTE — Telephone Encounter (Signed)
CMA spoke to patient to inform on lab results and PCP advising.  Patient understood and no questions.

## 2017-09-29 NOTE — Telephone Encounter (Signed)
-----   Message from Alfonse Spruce, [place] sent at 09/25/2017  2:53 PM EST ----- Labs that evaluate kidney function show improvement.  -Kidney function is stable since last check but is still decreased. Levels indicate you have chronic kidney disease stage 3.   -Continue to take your medications for blood pressure, avoid taking NSAID medications, reduce salt intake to 2 to 4 grams/day, do not smoke. -Recommend monitoring again in 6 months. If your levels have signifcantly increased you will be referred to nephrology.

## 2017-10-15 MED FILL — !BRILINTA 90 MG TABLET: 30 days supply | Qty: 60 | Fill #1

## 2017-10-15 MED FILL — OMEPRAZOLE DR 20 MG CAPSULE: 20 | 30 days supply | Qty: 60 | Fill #1

## 2017-10-20 MED FILL — METOPROLOL TARTRATE 25 MG T: 25 | 30 days supply | Qty: 60 | Fill #2

## 2017-10-21 ENCOUNTER — Telehealth (HOSPITAL_COMMUNITY): Payer: Self-pay | Admitting: *Deleted

## 2017-10-24 ENCOUNTER — Telehealth (HOSPITAL_COMMUNITY): Payer: Self-pay | Admitting: *Deleted

## 2017-11-17 MED FILL — METOPROLOL TARTRATE 25 MG T: 25 | 30 days supply | Qty: 60 | Fill #3

## 2017-11-17 MED FILL — !BRILINTA 90 MG TABLET: 30 days supply | Qty: 60 | Fill #2

## 2017-11-20 ENCOUNTER — Ambulatory Visit: Payer: Self-pay | Admitting: Family Medicine

## 2017-12-09 ENCOUNTER — Other Ambulatory Visit: Payer: Self-pay | Admitting: Internal Medicine

## 2017-12-09 NOTE — Telephone Encounter (Signed)
Done erx 

## 2017-12-09 NOTE — Telephone Encounter (Signed)
10/06/2017 90# 

## 2017-12-17 MED FILL — !BRILINTA 90 MG TABLET: 30 days supply | Qty: 60 | Fill #3

## 2017-12-17 MED FILL — OMEPRAZOLE DR 20 MG CAPSULE: 20 | 30 days supply | Qty: 60 | Fill #2

## 2017-12-17 MED FILL — METOPROLOL TARTRATE 25 MG T: 25 | 30 days supply | Qty: 60 | Fill #4

## 2017-12-18 MED FILL — LOSARTAN POTASSIUM 100 MG T: 100 | 30 days supply | Qty: 30 | Fill #1

## 2018-01-19 MED FILL — !BRILINTA 90 MG TABLET: 30 days supply | Qty: 60 | Fill #4

## 2018-01-19 MED FILL — METOPROLOL TARTRATE 25 MG T: 25 | 30 days supply | Qty: 60 | Fill #5

## 2018-02-17 MED FILL — OMEPRAZOLE 20 MG CAP: 20 | 30 days supply | Qty: 60 | Fill #3 | Status: TO

## 2018-02-17 MED FILL — !BRILINTA 90 MG TABLET: 30 days supply | Qty: 60 | Fill #5

## 2018-03-23 DIAGNOSIS — M87874 Other osteonecrosis, right foot: Secondary | ICD-10-CM | POA: Diagnosis not present

## 2018-03-23 DIAGNOSIS — G5761 Lesion of plantar nerve, right lower limb: Secondary | ICD-10-CM | POA: Diagnosis not present

## 2018-03-23 DIAGNOSIS — M7751 Other enthesopathy of right foot: Secondary | ICD-10-CM | POA: Diagnosis not present

## 2018-03-23 DIAGNOSIS — M19071 Primary osteoarthritis, right ankle and foot: Secondary | ICD-10-CM | POA: Diagnosis not present

## 2018-04-07 ENCOUNTER — Encounter: Payer: Self-pay | Admitting: Internal Medicine

## 2018-04-07 ENCOUNTER — Other Ambulatory Visit (INDEPENDENT_AMBULATORY_CARE_PROVIDER_SITE_OTHER): Payer: Medicare HMO

## 2018-04-07 ENCOUNTER — Ambulatory Visit (INDEPENDENT_AMBULATORY_CARE_PROVIDER_SITE_OTHER): Payer: Medicare HMO | Admitting: Internal Medicine

## 2018-04-07 VITALS — BP 112/74 | HR 76 | Temp 98.8°F | Ht 72.0 in | Wt 198.0 lb

## 2018-04-07 DIAGNOSIS — R739 Hyperglycemia, unspecified: Secondary | ICD-10-CM

## 2018-04-07 DIAGNOSIS — Z0000 Encounter for general adult medical examination without abnormal findings: Secondary | ICD-10-CM

## 2018-04-07 DIAGNOSIS — Z23 Encounter for immunization: Secondary | ICD-10-CM | POA: Diagnosis not present

## 2018-04-07 DIAGNOSIS — N183 Chronic kidney disease, stage 3 unspecified: Secondary | ICD-10-CM | POA: Insufficient documentation

## 2018-04-07 DIAGNOSIS — E782 Mixed hyperlipidemia: Secondary | ICD-10-CM | POA: Diagnosis not present

## 2018-04-07 DIAGNOSIS — I252 Old myocardial infarction: Secondary | ICD-10-CM

## 2018-04-07 DIAGNOSIS — Z8711 Personal history of peptic ulcer disease: Secondary | ICD-10-CM | POA: Diagnosis not present

## 2018-04-07 LAB — CBC WITH DIFFERENTIAL/PLATELET
BASOS PCT: 1.1 % (ref 0.0–3.0)
Basophils Absolute: 0.1 10*3/uL (ref 0.0–0.1)
EOS PCT: 2.8 % (ref 0.0–5.0)
Eosinophils Absolute: 0.2 10*3/uL (ref 0.0–0.7)
HEMATOCRIT: 40.1 % (ref 39.0–52.0)
HEMOGLOBIN: 13.5 g/dL (ref 13.0–17.0)
LYMPHS PCT: 25.3 % (ref 12.0–46.0)
Lymphs Abs: 2 10*3/uL (ref 0.7–4.0)
MCHC: 33.6 g/dL (ref 30.0–36.0)
MCV: 86.3 fl (ref 78.0–100.0)
MONO ABS: 0.8 10*3/uL (ref 0.1–1.0)
MONOS PCT: 10.3 % (ref 3.0–12.0)
NEUTROS PCT: 60.5 % (ref 43.0–77.0)
Neutro Abs: 4.8 10*3/uL (ref 1.4–7.7)
Platelets: 285 10*3/uL (ref 150.0–400.0)
RBC: 4.65 Mil/uL (ref 4.22–5.81)
RDW: 13.8 % (ref 11.5–15.5)
WBC: 7.9 10*3/uL (ref 4.0–10.5)

## 2018-04-07 LAB — BASIC METABOLIC PANEL
BUN: 20 mg/dL (ref 6–23)
CHLORIDE: 105 meq/L (ref 96–112)
CO2: 24 mEq/L (ref 19–32)
Calcium: 9.9 mg/dL (ref 8.4–10.5)
Creatinine, Ser: 1.3 mg/dL (ref 0.40–1.50)
GFR: 58.87 mL/min — AB (ref >60.00)
Glucose, Bld: 100 mg/dL — ABNORMAL HIGH (ref 70–99)
POTASSIUM: 4.6 meq/L (ref 3.5–5.1)
Sodium: 140 mEq/L (ref 135–145)

## 2018-04-07 LAB — URINALYSIS, ROUTINE W REFLEX MICROSCOPIC
Bilirubin Urine: NEGATIVE
Hgb urine dipstick: NEGATIVE
KETONES UR: NEGATIVE
Leukocytes, UA: NEGATIVE
Nitrite: NEGATIVE
PH: 6 (ref 5.0–8.0)
RBC / HPF: NONE SEEN (ref 0–?)
SPECIFIC GRAVITY, URINE: 1.01 (ref 1.000–1.030)
Total Protein, Urine: NEGATIVE
UROBILINOGEN UA: 0.2 (ref 0.0–1.0)
Urine Glucose: NEGATIVE
WBC, UA: NONE SEEN (ref 0–?)

## 2018-04-07 LAB — LIPID PANEL
CHOLESTEROL: 195 mg/dL (ref 0–200)
HDL: 37.8 mg/dL — AB (ref >39.00)
LDL CALC: 130 mg/dL — AB (ref 0–99)
NonHDL: 156.93
TRIGLYCERIDES: 134 mg/dL (ref 0.0–149.0)
Total CHOL/HDL Ratio: 5
VLDL: 26.8 mg/dL (ref 0.0–40.0)

## 2018-04-07 LAB — HEPATIC FUNCTION PANEL
ALT: 26 U/L (ref 0–53)
AST: 22 U/L (ref 0–37)
Albumin: 4.4 g/dL (ref 3.5–5.2)
Alkaline Phosphatase: 61 U/L (ref 39–117)
BILIRUBIN TOTAL: 0.7 mg/dL (ref 0.2–1.2)
Bilirubin, Direct: 0.1 mg/dL (ref 0.0–0.3)
Total Protein: 7.1 g/dL (ref 6.0–8.3)

## 2018-04-07 LAB — TSH: TSH: 1.39 u[IU]/mL (ref 0.35–4.50)

## 2018-04-07 LAB — HEMOGLOBIN A1C: Hgb A1c MFr Bld: 5.5 % (ref 4.6–6.5)

## 2018-04-07 LAB — PSA: PSA: 1.42 ng/mL (ref 0.10–4.00)

## 2018-04-07 MED ORDER — OMEPRAZOLE 20 MG PO CPDR: 20.0000 mg | DELAYED_RELEASE_CAPSULE | Freq: Every day | ORAL | 3 refills | Status: DC

## 2018-04-07 MED ORDER — TICAGRELOR 90 MG PO TABS: 90.0000 mg | ORAL_TABLET | Freq: Two times a day (BID) | ORAL | 6 refills | Status: DC

## 2018-04-07 MED ORDER — LOSARTAN POTASSIUM 50 MG PO TABS: 50.0000 mg | ORAL_TABLET | Freq: Every day | ORAL | 3 refills | Status: DC

## 2018-04-07 MED ORDER — ZOLPIDEM TARTRATE 10 MG PO TABS: 10.0000 mg | ORAL_TABLET | Freq: Every evening | ORAL | 1 refills | Status: DC | PRN

## 2018-04-07 NOTE — Assessment & Plan Note (Signed)

## 2018-04-07 NOTE — Progress Notes (Signed)
Subjective:    Patient ID: [name] Michael, male    DOB: 1951-07-20, 67 y.o.   MRN: [id number]  HPI  Here for wellness and f/u;  Overall doing ok;  Pt denies Chest pain, worsening SOB, DOE, wheezing, orthopnea, PND, worsening LE edema, palpitations, dizziness or syncope.  Pt denies neurological change such as new headache, facial or extremity weakness.  Pt denies polydipsia, polyuria, or low sugar symptoms. Pt states overall good compliance with treatment and medications, good tolerability, and has been trying to follow appropriate diet.  Pt denies worsening depressive symptoms, suicidal ideation or panic. No fever, night sweats, wt loss, loss of appetite, or other constitutional symptoms.  Pt states good ability with ADL's, has low fall risk, home safety reviewed and adequate, no other significant changes in hearing or vision, and only occasionally active with exercise.  [facility] flu shot for [facility] at the church.  Has lost wt since stent placement with better diet and lifestyle.  Wt Readings from Last 3 Encounters:  04/07/18 198 lb (89.8 kg)  09/01/17 203 lb 9.6 oz (92.4 kg)  08/25/17 211 lb (95.7 kg)  Has been considered for repatha at time of ACS but no insurance then, plans to f/u with cardiology for possible start Past Medical History:  Diagnosis Date  . ALLERGIC RHINITIS 04/30/2007  . ANEMIA-NOS 07/12/2007  . ANXIETY 02/17/2009  . Coronary artery disease involving native coronary artery of native heart with unstable angina pectoris ([place]) 05/10/2011  . DEPRESSIVE DISORDER 02/14/2009  . GERD (gastroesophageal reflux disease)   . History of pulmonary embolism 03/09/2008  . HYPERLIPIDEMIA 04/27/2007  . HYPERTENSION 04/27/2007  . INSOMNIA UNSPECIFIED 02/20/2010  . Lupus ([place])    "w/RA" (08/20/2017)  . 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 Migraine    "stopped in the early 2000s" (08/20/2017)  . Non-ST elevation (NSTEMI) myocardial infarction ([place]) -TYPE 4A peri-PCI 08/21/2017  . Nonspecific abnormal  toxicological findings 03/14/2008  . NSTEMI (non-ST elevated myocardial infarction) ([place]) 08/20/2017  . OBESITY 04/30/2007  . OSTEOARTHRITIS 04/30/2007  . 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 Presence of drug coated stent in left circumflex coronary artery 08/21/2017  . PROSTATITIS, HX OF 04/30/2007  . RESTRICTIVE LUNG DISEASE 06/20/2009  . Rheumatoid arthritis ([place])   . THORACIC AORTIC ANEURYSM 07/06/2009   Chest CT 11/12: ascending thoracic aortic aneurysm stable at 42 mm.   Marland Kitchen UNSPECIFIED MYALGIA AND MYOSITIS 02/14/2009   Past Surgical History:  Procedure Laterality Date  . APPENDECTOMY    . BACK SURGERY    . CARDIAC CATHETERIZATION  06/2008   Non-obstructive CAD.  EF 40-45%. Anterior - apical HK  . CORONARY ANGIOPLASTY WITH STENT PLACEMENT  08/20/2017  . CORONARY STENT INTERVENTION N/A 08/20/2017   Procedure: CORONARY STENT INTERVENTION;  Surgeon: [name], MD;  Location: [facility];  Service: Cardiovascular;  Laterality: N/A;  . LEFT HEART CATH AND CORONARY ANGIOGRAPHY N/A 08/20/2017   Procedure: LEFT HEART CATH AND CORONARY ANGIOGRAPHY;  Surgeon: [name], MD;  Location: [facility];  Service: Cardiovascular;  Laterality: N/A;  . NM MYOVIEW LTD  06/2009   Abnormal stress nuclear study with small prior lateral infarct; no ischemia. Normal EF  . TRANSTHORACIC ECHOCARDIOGRAM  06/2009   55% to 60%. Wall motion was normal.  Mod Dilated Aoi Root.  Mild LA dilation.    reports that he has never smoked. He has never used smokeless tobacco. He reports that he does not drink alcohol or use drugs. family history includes Alcohol abuse  in his other; Diabetes in his brother, brother, father, mother, and other; Heart disease in his brother, father, mother, and other; Hyperlipidemia in his father and mother; Hypertension in his brother, other, and sister; Stroke in his father, mother, and other. Allergies  Allergen Reactions  . Atorvastatin     REACTION: myalgias  .  Ciprofloxacin Other (See Comments)    myalgias  . Crestor [Rosuvastatin]     myalgias  . Doxycycline   . Hydroxychloroquine Sulfate     REACTION: GI upset and H, pt is currently taking and still has some GI problems but is tolerating it   Current Outpatient Medications on File Prior to Visit  Medication Sig Dispense Refill  . aspirin 81 MG EC tablet Take 1 tablet (81 mg total) by mouth daily. 30 tablet 11  . clonazePAM (KLONOPIN) 1 MG tablet TAKE 1 TABLET BY MOUTH TWICE A DAY IF NEEDED FOR ANXIETY  60 tablet 4  . hydroxychloroquine (PLAQUENIL) 200 MG tablet Take 200 mg by mouth 2 (two) times daily.     Marland Kitchen leflunomide (ARAVA) 20 MG tablet Take 20 mg by mouth daily.      . metoprolol tartrate (LOPRESSOR) 25 MG tablet Take 1 tablet (25 mg total) by mouth 2 (two) times daily. 120 tablet 2   No current facility-administered medications on file prior to visit.    Review of Systems Constitutional: Negative for other unusual diaphoresis, sweats, appetite or weight changes HENT: Negative for other worsening hearing loss, ear pain, facial swelling, mouth sores or neck stiffness.   Eyes: Negative for other worsening pain, redness or other visual disturbance.  Respiratory: Negative for other stridor or swelling Cardiovascular: Negative for other palpitations or other chest pain  Gastrointestinal: Negative for worsening diarrhea or loose stools, blood in stool, distention or other pain Genitourinary: Negative for hematuria, flank pain or other change in urine volume.  Musculoskeletal: Negative for myalgias or other joint swelling.  Skin: Negative for other color change, or other wound or worsening drainage.  Neurological: Negative for other syncope or numbness. Hematological: Negative for other adenopathy or swelling Psychiatric/Behavioral: Negative for hallucinations, other worsening agitation, SI, self-injury, or new decreased concentration All other system neg per pt    Objective:   Physical  Exam BP 112/74   Pulse 76   Temp 98.8 F (37.1 C) (Oral)   Ht 6' (1.829 m)   Wt 198 lb (89.8 kg)   SpO2 96%   BMI 26.85 kg/m  VS noted,  Constitutional: Pt is oriented to person, place, and time. Appears well-developed and well-nourished, in no significant distress and comfortable Head: Normocephalic and atraumatic  Eyes: Conjunctivae and EOM are normal. Pupils are equal, round, and reactive to light Right Ear: External ear normal without discharge Left Ear: External ear normal without discharge Nose: Nose without discharge or deformity Mouth/Throat: Oropharynx is without other ulcerations and moist  Neck: Normal range of motion. Neck supple. No JVD present. No tracheal deviation present or significant neck LA or mass Cardiovascular: Normal rate, regular rhythm, normal heart sounds and intact distal pulses.   Pulmonary/Chest: WOB normal and breath sounds without rales or wheezing  Abdominal: Soft. Bowel sounds are normal. NT. No HSM  Musculoskeletal: Normal range of motion. Exhibits no edema Lymphadenopathy: Has no other cervical adenopathy.  Neurological: Pt is alert and oriented to person, place, and time. Pt has normal reflexes. No cranial nerve deficit. Motor grossly intact, Gait intact Skin: Skin is warm and dry. No rash noted or new  ulcerations Psychiatric:  Has normal mood and affect. Behavior is normal without agitation No other exam findings Lab Results  Component Value Date   WBC 7.9 04/07/2018   HGB 13.5 04/07/2018   HCT 40.1 04/07/2018   PLT 285.0 04/07/2018   GLUCOSE 100 (H) 04/07/2018   CHOL 195 04/07/2018   TRIG 134.0 04/07/2018   HDL 37.80 (L) 04/07/2018   LDLDIRECT 131.0 04/03/2017   LDLCALC 130 (H) 04/07/2018   ALT 26 04/07/2018   AST 22 04/07/2018   NA 140 04/07/2018   K 4.6 04/07/2018   CL 105 04/07/2018   CREATININE 1.30 04/07/2018   BUN 20 04/07/2018   CO2 24 04/07/2018   TSH 1.39 04/07/2018   PSA 1.42 04/07/2018   INR 1.00 08/21/2017   HGBA1C  5.5 04/07/2018       Assessment & Plan:

## 2018-04-07 NOTE — Assessment & Plan Note (Signed)
stable overall by history and exam, recent data reviewed with pt, and pt to continue medical treatment as before,  to f/u any worsening symptoms or concerns  

## 2018-04-07 NOTE — Assessment & Plan Note (Signed)
stalbe

## 2018-04-07 NOTE — Patient Instructions (Addendum)
You had Pneumovax pneumonia shot today  .Please continue all other medications as before, and refills have been done if requested.  Please have the pharmacy call with any other refills you may need.  Please continue your efforts at being more active, low cholesterol diet, and weight control.  You are otherwise up to date with prevention measures today.  Please keep your appointments with your specialists as you may have planned  Please go to the LAB in the Basement (turn left off the elevator) for the tests to be done today  You will be contacted by phone if any changes need to be made immediately.  Otherwise, you will receive a letter about your results with an explanation, but please check with MyChart first.  Please remember to sign up for MyChart if you have not done so, as this will be important to you in the future with finding out test results, communicating by private email, and scheduling acute appointments online when needed.  Please return in 6 months, or sooner if needed, with Lab testing done 3-5 days before

## 2018-04-08 ENCOUNTER — Encounter: Payer: Self-pay | Admitting: Internal Medicine

## 2018-04-09 ENCOUNTER — Other Ambulatory Visit: Payer: Self-pay | Admitting: Internal Medicine

## 2018-04-09 NOTE — Telephone Encounter (Signed)
Done erx 

## 2018-04-09 NOTE — Telephone Encounter (Signed)
   LOV: 04/07/18 NextOV:10/06/18 Last Filled/Quantity: 01/26/18 30#

## 2018-04-14 ENCOUNTER — Telehealth: Payer: Self-pay | Admitting: Internal Medicine

## 2018-04-14 NOTE — Telephone Encounter (Signed)
Copied from [place] [id number]. Topic: Quick Communication - Rx Refill/Question >> Apr 14, 2018  3:21 PM Neva Seat wrote:  zolpidem (AMBIEN) 10 MG tablet   [place] [place] [phone]  - needing prior authorization from doctor to have a refill.

## 2018-04-15 NOTE — Telephone Encounter (Signed)
Complated PA on cover-my-meds. Recieived msg stating Your information has been sent to [place] (Key: [id number]), PA Case ID: [id number]. Will recheck later for approval status.Marland KitchenJohny Chess

## 2018-04-16 DIAGNOSIS — G603 Idiopathic progressive neuropathy: Secondary | ICD-10-CM | POA: Diagnosis not present

## 2018-04-16 DIAGNOSIS — G5741 Lesion of medial popliteal nerve, right lower limb: Secondary | ICD-10-CM | POA: Diagnosis not present

## 2018-04-16 DIAGNOSIS — E531 Pyridoxine deficiency: Secondary | ICD-10-CM | POA: Diagnosis not present

## 2018-04-16 DIAGNOSIS — G5601 Carpal tunnel syndrome, right upper limb: Secondary | ICD-10-CM | POA: Diagnosis not present

## 2018-04-16 DIAGNOSIS — R634 Abnormal weight loss: Secondary | ICD-10-CM | POA: Diagnosis not present

## 2018-04-16 DIAGNOSIS — R201 Hypoesthesia of skin: Secondary | ICD-10-CM | POA: Diagnosis not present

## 2018-04-16 DIAGNOSIS — M5417 Radiculopathy, lumbosacral region: Secondary | ICD-10-CM | POA: Diagnosis not present

## 2018-04-16 DIAGNOSIS — E538 Deficiency of other specified B group vitamins: Secondary | ICD-10-CM | POA: Diagnosis not present

## 2018-04-16 DIAGNOSIS — G609 Hereditary and idiopathic neuropathy, unspecified: Secondary | ICD-10-CM | POA: Diagnosis not present

## 2018-04-16 DIAGNOSIS — R202 Paresthesia of skin: Secondary | ICD-10-CM | POA: Diagnosis not present

## 2018-04-27 DIAGNOSIS — G5761 Lesion of plantar nerve, right lower limb: Secondary | ICD-10-CM | POA: Diagnosis not present

## 2018-04-27 DIAGNOSIS — M7751 Other enthesopathy of right foot: Secondary | ICD-10-CM | POA: Diagnosis not present

## 2018-04-28 DIAGNOSIS — M0589 Other rheumatoid arthritis with rheumatoid factor of multiple sites: Secondary | ICD-10-CM | POA: Diagnosis not present

## 2018-04-28 NOTE — Telephone Encounter (Signed)
Check cover-my-meds for approval status received msg stating " This request has received a N/A outcome".  Check [place] registry pt got med filled on 04/07/2018.Marland KitchenJohny Michael

## 2018-05-11 DIAGNOSIS — G5761 Lesion of plantar nerve, right lower limb: Secondary | ICD-10-CM | POA: Diagnosis not present

## 2018-05-11 DIAGNOSIS — M7751 Other enthesopathy of right foot: Secondary | ICD-10-CM | POA: Diagnosis not present

## 2018-05-18 ENCOUNTER — Other Ambulatory Visit: Payer: Self-pay | Admitting: Internal Medicine

## 2018-05-20 DIAGNOSIS — R69 Illness, unspecified: Secondary | ICD-10-CM | POA: Diagnosis not present

## 2018-06-01 DIAGNOSIS — M7751 Other enthesopathy of right foot: Secondary | ICD-10-CM | POA: Diagnosis not present

## 2018-06-01 DIAGNOSIS — G5761 Lesion of plantar nerve, right lower limb: Secondary | ICD-10-CM | POA: Diagnosis not present

## 2018-06-11 DIAGNOSIS — L821 Other seborrheic keratosis: Secondary | ICD-10-CM | POA: Diagnosis not present

## 2018-06-11 DIAGNOSIS — L720 Epidermal cyst: Secondary | ICD-10-CM | POA: Diagnosis not present

## 2018-06-11 DIAGNOSIS — L570 Actinic keratosis: Secondary | ICD-10-CM | POA: Diagnosis not present

## 2018-06-16 ENCOUNTER — Other Ambulatory Visit: Payer: Self-pay | Admitting: Internal Medicine

## 2018-06-22 DIAGNOSIS — G5761 Lesion of plantar nerve, right lower limb: Secondary | ICD-10-CM | POA: Diagnosis not present

## 2018-06-22 DIAGNOSIS — M7751 Other enthesopathy of right foot: Secondary | ICD-10-CM | POA: Diagnosis not present

## 2018-07-06 NOTE — Progress Notes (Signed)
Cardiology Office Note   Date:  07/07/2018   ID:  [name] Michael, DOB [birth year], MRN [id number]  PCP:  Biagio Borg, MD  Cardiologist: [place]   Chief Complaint  Patient presents with  . Coronary Artery Disease    C/O HEADACHES AND THE "SHAKES" X1 WEEK  . Follow-up     History of Present Illness: [name] Michael is a 67 y.o. male who presents for CAD with most recent cath in 08/2017 revealing ostial and proximal Cx stenosis, treated with PTCA: He is intolerant to statins and is now on Repatha via our pharmacists. He also has a history of AAA(4.6 cm) and is being seen by CVTS for ongoing surveillance. He was last seen in our office on 09/01/2017 for TOC follow up in January of 2019. Other history includes HTN, HL, and multiple medical problems.   He comes today with multiple complaints. Neck pain, fatigue, difficult to control BP, shaking feeling. He has not taken his medications this am. He is tachycardic. H states that these symptoms have bene worsening over the last several weeks. He denies chest pain, anything discomfort similar to his prior MI. He has not taken Repatha because he did not qualify in the past.   Past Medical History:  Diagnosis Date  . ALLERGIC RHINITIS 04/30/2007  . ANEMIA-NOS 07/12/2007  . ANXIETY 02/17/2009  . Coronary artery disease involving native coronary artery of native heart with unstable angina pectoris ([place]) 05/10/2011  . DEPRESSIVE DISORDER 02/14/2009  . GERD (gastroesophageal reflux disease)   . History of pulmonary embolism 03/09/2008  . HYPERLIPIDEMIA 04/27/2007  . HYPERTENSION 04/27/2007  . INSOMNIA UNSPECIFIED 02/20/2010  . Lupus ([place])    "w/RA" (08/20/2017)  . 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 Migraine    "stopped in the early 2000s" (08/20/2017)  . Non-ST elevation (NSTEMI) myocardial infarction ([place]) -TYPE 4A peri-PCI 08/21/2017  . Nonspecific abnormal toxicological findings 03/14/2008  . NSTEMI (non-ST elevated myocardial infarction) ([place])  08/20/2017  . OBESITY 04/30/2007  . OSTEOARTHRITIS 04/30/2007  . 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 Presence of drug coated stent in left circumflex coronary artery 08/21/2017  . PROSTATITIS, HX OF 04/30/2007  . RESTRICTIVE LUNG DISEASE 06/20/2009  . Rheumatoid arthritis ([place])   . THORACIC AORTIC ANEURYSM 07/06/2009   Chest CT 11/12: ascending thoracic aortic aneurysm stable at 42 mm.   Marland Kitchen UNSPECIFIED MYALGIA AND MYOSITIS 02/14/2009    Past Surgical History:  Procedure Laterality Date  . APPENDECTOMY    . BACK SURGERY    . CARDIAC CATHETERIZATION  06/2008   Non-obstructive CAD.  EF 40-45%. Anterior - apical HK  . CORONARY ANGIOPLASTY WITH STENT PLACEMENT  08/20/2017  . CORONARY STENT INTERVENTION N/A 08/20/2017   Procedure: CORONARY STENT INTERVENTION;  Surgeon: [name], MD;  Location: [facility];  Service: Cardiovascular;  Laterality: N/A;  . LEFT HEART CATH AND CORONARY ANGIOGRAPHY N/A 08/20/2017   Procedure: LEFT HEART CATH AND CORONARY ANGIOGRAPHY;  Surgeon: [name], MD;  Location: [facility];  Service: Cardiovascular;  Laterality: N/A;  . NM MYOVIEW LTD  06/2009   Abnormal stress nuclear study with small prior lateral infarct; no ischemia. Normal EF  . TRANSTHORACIC ECHOCARDIOGRAM  06/2009   55% to 60%. Wall motion was normal.  Mod Dilated Aoi Root.  Mild LA dilation.     Current Outpatient Medications  Medication Sig Dispense Refill  . aspirin 81 MG EC tablet Take 1 tablet (81 mg total) by mouth daily. 30 tablet 11  . clonazePAM (  KLONOPIN) 1 MG tablet take 1 tablet by mouth twice a day if needed for anxiety  60 tablet 3  . hydroxychloroquine (PLAQUENIL) 200 MG tablet Take 200 mg by mouth 2 (two) times daily.     Marland Kitchen leflunomide (ARAVA) 20 MG tablet Take 20 mg by mouth daily.      Marland Kitchen losartan (COZAAR) 50 MG tablet Take 1 tablet (50 mg total) by mouth daily. 90 tablet 3  . metoprolol tartrate (LOPRESSOR) 25 MG tablet Take 1 tablet (25 mg total) by mouth 2  (two) times daily. 120 tablet 2  . metoprolol tartrate (LOPRESSOR) 25 MG tablet TAKE ONE TABLET BY MOUTH TWICE DAILY  120 tablet 1  . omeprazole (PRILOSEC) 20 MG capsule Take 1 capsule (20 mg total) by mouth daily. 90 capsule 3  . sildenafil (VIAGRA) 100 MG tablet TAKE 1/2 TO 1 TABLET BY MOUTH EVERY DAY 5 tablet 10  . ticagrelor (BRILINTA) 90 MG TABS tablet Take 1 tablet (90 mg total) by mouth 2 (two) times daily. 60 tablet 6  . zolpidem (AMBIEN) 10 MG tablet Take 1 tablet (10 mg total) by mouth at bedtime as needed for sleep. 90 tablet 1   No current facility-administered medications for this visit.     Allergies:   Atorvastatin; Ciprofloxacin; Crestor [rosuvastatin]; Doxycycline; and Hydroxychloroquine sulfate    Social History:  The patient  reports that he has never smoked. He has never used smokeless tobacco. He reports that he does not drink alcohol or use drugs.   Family History:  The patient's family history includes Alcohol abuse in his other; Diabetes in his brother, brother, father, mother, and other; Heart disease in his brother, father, mother, and other; Hyperlipidemia in his father and mother; Hypertension in his brother, other, and sister; Stroke in his father, mother, and other.    ROS: All other systems are reviewed and negative. Unless otherwise mentioned in H&P    PHYSICAL EXAM: VS:  BP (!) 120/93 (BP Location: Left Arm)   Pulse (!) 108   Ht 6' (1.829 m)   Wt 203 lb 12.8 oz (92.4 kg)   BMI 27.64 kg/m  , BMI Body mass index is 27.64 kg/m. GEN: Well nourished, well developed, in no acute distress HEENT: normal Neck: no JVD, carotid bruits, or masses Cardiac: RRR, tachycardic; no murmurs, rubs, or gallops,no edema  Respiratory:  Clear to auscultation bilaterally, normal work of breathing GI: soft, nontender, nondistended, + BS MS: no deformity or atrophy Skin: warm and dry, no rash Neuro:  Strength and sensation are intact Psych: euthymic mood, flat  affect   EKG: Sinus tachycardia, rate of 92 bpm.    Recent Labs: 04/07/2018: ALT 26; BUN 20; Creatinine, Ser 1.30; Hemoglobin 13.5; Platelets 285.0; Potassium 4.6; Sodium 140; TSH 1.39    Lipid Panel    Component Value Date/Time   CHOL 195 04/07/2018 1015   TRIG 134.0 04/07/2018 1015   TRIG 63 08/15/2006 0721   HDL 37.80 (L) 04/07/2018 1015   CHOLHDL 5 04/07/2018 1015   VLDL 26.8 04/07/2018 1015   LDLCALC 130 (H) 04/07/2018 1015   LDLDIRECT 131.0 04/03/2017 1156      Wt Readings from Last 3 Encounters:  07/07/18 203 lb 12.8 oz (92.4 kg)  04/07/18 198 lb (89.8 kg)  09/01/17 203 lb 9.6 oz (92.4 kg)    Other studies Reviewed:  CT of the Chest with Contrast 08/20/2017 IMPRESSION: 1. No acute intrathoracic pathology. No CT evidence of pulmonary embolism. 2. Mild cardiomegaly and  multi vessel coronary vascular calcification. 3. Mildly dilated ascending aorta measuring 4.6 cm in diameter. Ascending thoracic aortic aneurysm. Recommend semi-annual imaging followup by CTA or MRA and referral to cardiothoracic surgery if not already obtained. This recommendation follows 2010 ACCF/AHA/AATS/ACR/ASA/SCA/SCAI/SIR/STS/SVM Guidelines for the Diagnosis and Management of Patients With Thoracic Aortic Disease.  Cardiac Cath 08/20/2017 Conclusion     Ost Cx to Prox Cx lesion is 90% stenosed.  A drug-eluting stent was successfully placed using a STENT SYNERGY DES 3.5X16.  Post intervention, there is a 0% residual stenosis.  Prox RCA lesion is 50% stenosed.  Mid RCA lesion is 50% stenosed.   1. Severe single vessel CAD involving the ostial/proximal circumflex treated with PTCA and DES implantation (3.5x16 mm Synergy DES) 2. Diffuse coronary ectasia involving the LAD, circumflex, and RCA 3. PCI complicated by distal embolization into the ramus intermediate branches, treated with tirofiban and IC verapamil, pt hemodynamically stable with mild chest discomfort at procedure  completion      ASSESSMENT AND PLAN:  1. CAD: Hx of DES to the Cx in 08/2017 with disease in the RCA not greater than 50%. He has been having profound fatigue, spikes in his BP, but no chest pain. I will complete a Lexiscan myoview for evaluation of progressive CAD.   2. Thoracic Aneurysm: This was found on CT 08/2017. Was to have follow up CT semi annually. Will repeat this to evaluate further.   3. Hyperlipidemia: Lipid studies were completed in Sept 2019 by PCP. LDL 130. TC 1 95. He is way our of range of <70 for CAD patients. He has been seen by pharmacy in the past for [place] and did not qualify under his insurance per patient reports. He has Medicare now. I will re-refer him to Pharmacy for institution of PCSK-9 inhibitor. He does not tolerate statins.   4. Hypertension: Very labile according to the patient. He states that it is very high in the mornings. 094'B to 096'G systolic. But when he takes his medications BP does normalize. He has been going up on the losartan to 50 mg from 25 mg daily. I have advised him to go back to 25 mg daily now. He is to take his medication prior to next visit.     Current medicines are reviewed at length with the patient today.    Labs/ tests ordered today include: Lexiscan myoview, CT of the Chest. Referral to Pharmacist. I have spent over 40 minutes with this patient reviewing his medications, providing explanations, answering questions, and conducting assessment.    [name] Michael, ANP, AACC   07/07/2018 8:15 AM    [place] [address] Office [phone] Fax [phone]

## 2018-07-07 ENCOUNTER — Ambulatory Visit (INDEPENDENT_AMBULATORY_CARE_PROVIDER_SITE_OTHER): Payer: Medicare HMO | Admitting: Adult Health

## 2018-07-07 ENCOUNTER — Encounter: Payer: Self-pay | Admitting: Adult Health

## 2018-07-07 VITALS — BP 120/93 | HR 108 | Ht 72.0 in | Wt 203.8 lb

## 2018-07-07 DIAGNOSIS — Z79899 Other long term (current) drug therapy: Secondary | ICD-10-CM

## 2018-07-07 DIAGNOSIS — I2510 Atherosclerotic heart disease of native coronary artery without angina pectoris: Secondary | ICD-10-CM

## 2018-07-07 DIAGNOSIS — I10 Essential (primary) hypertension: Secondary | ICD-10-CM | POA: Diagnosis not present

## 2018-07-07 DIAGNOSIS — E7800 Pure hypercholesterolemia, unspecified: Secondary | ICD-10-CM

## 2018-07-07 DIAGNOSIS — I712 Thoracic aortic aneurysm, without rupture, unspecified: Secondary | ICD-10-CM

## 2018-07-07 LAB — BASIC METABOLIC PANEL
BUN/Creatinine Ratio: 12 (ref 10–24)
BUN: 16 mg/dL (ref 8–27)
CALCIUM: 10.1 mg/dL (ref 8.6–10.2)
CHLORIDE: 100 mmol/L (ref 96–106)
CO2: 22 mmol/L (ref 20–29)
CREATININE: 1.35 mg/dL — AB (ref 0.76–1.27)
GFR calc Af Amer: 63 mL/min/{1.73_m2} (ref >59)
GFR calc non Af Amer: 55 mL/min/{1.73_m2} — ABNORMAL LOW (ref >59)
Glucose: 92 mg/dL (ref 65–99)
POTASSIUM: 4.9 mmol/L (ref 3.5–5.2)
Sodium: 140 mmol/L (ref 134–144)

## 2018-07-07 MED ORDER — LOSARTAN POTASSIUM 25 MG PO TABS: 25.0000 mg | ORAL_TABLET | Freq: Every day | ORAL | 6 refills | Status: DC

## 2018-07-07 NOTE — Patient Instructions (Signed)
Medication Instructions:  NO CHANGES- Your physician recommends that you continue on your current medications as directed. Please refer to the Current Medication list given to you today.  If you need a refill on your cardiac medications before your next appointment, please call your pharmacy.  Labwork: BMET TODAY IN OUR OFFICE [facility]  If you have labs (blood work) drawn today and your tests are completely normal, you will receive your results only by: Marland Kitchen MyChart Message (if you have MyChart) OR . A paper copy in the mail If you have any lab test that is abnormal or we need to change your treatment, we will call you to review the results.  Testing/Procedures: Your physician has requested that you have a lexiscan myoview. A cardiac stress test is a cardiological test that measures the heart's ability to respond to external stress in a controlled clinical environment. The stress response is induced by intravenous pharmacological stimulation.   Your physician has requested that you have cardiac CT. Cardiac computed tomography (CT) is a painless test that uses an x-ray machine to take clear, detailed pictures of your heart. For further information please visit [facility]. Please follow instruction sheet as given.  Follow-Up: You will need a follow up appointment in [place].   You may see  DR Cecil Cranker, DNP, AACC or one of the following Advanced Practice Providers on your designated Care Team:    . Jory Sims, DNP, ANP . Rosaria Ferries, PA-C  At [facility], you and your health needs are our priority.  As part of our continuing mission to provide you with exceptional heart care, we have created designated Provider Care Teams.  These Care Teams include your primary Cardiologist (physician) and Advanced Practice Providers (APPs -  Physician Assistants and Nurse Practitioners) who all work together to provide you with the care you need, when you need  it.  Thank you for choosing [facility]!!

## 2018-07-14 ENCOUNTER — Telehealth (HOSPITAL_COMMUNITY): Payer: Self-pay

## 2018-07-14 NOTE — Telephone Encounter (Signed)
Encounter complete. 

## 2018-07-16 ENCOUNTER — Ambulatory Visit (HOSPITAL_COMMUNITY)
Admission: RE | Admit: 2018-07-16 | Discharge: 2018-07-16 | Disposition: A | Discharge status: Home / Self Care | Payer: Medicare HMO | Source: Ambulatory Visit | Attending: Cardiology | Admitting: Cardiology

## 2018-07-16 DIAGNOSIS — I712 Thoracic aortic aneurysm, without rupture, unspecified: Secondary | ICD-10-CM

## 2018-07-16 LAB — MYOCARDIAL PERFUSION IMAGING
CHL CUP NUCLEAR SSS: 6
LV dias vol: 107 mL (ref 62–150)
LV sys vol: 57 mL
Peak HR: 103 {beats}/min
Rest HR: 70 {beats}/min
SDS: 2
SRS: 4
TID: 1.01

## 2018-07-16 MED ORDER — TECHNETIUM TC 99M TETROFOSMIN IV KIT
31.1000 | PACK | Freq: Once | INTRAVENOUS | Status: AC | PRN
  Administered 2018-07-16: 31.1 via INTRAVENOUS
  Filled 2018-07-16: qty 32

## 2018-07-16 MED ORDER — REGADENOSON 0.4 MG/5ML IV SOLN
0.4000 mg | Freq: Once | INTRAVENOUS | Status: AC
  Administered 2018-07-16: 0.4 mg via INTRAVENOUS

## 2018-07-16 MED ORDER — TECHNETIUM TC 99M TETROFOSMIN IV KIT
10.4000 | PACK | Freq: Once | INTRAVENOUS | Status: AC | PRN
  Administered 2018-07-16: 10.4 via INTRAVENOUS
  Filled 2018-07-16: qty 11

## 2018-07-17 DIAGNOSIS — G5761 Lesion of plantar nerve, right lower limb: Secondary | ICD-10-CM | POA: Diagnosis not present

## 2018-07-17 DIAGNOSIS — M7751 Other enthesopathy of right foot: Secondary | ICD-10-CM | POA: Diagnosis not present

## 2018-07-20 ENCOUNTER — Ambulatory Visit: Payer: Medicare HMO | Admitting: Adult Health

## 2018-07-22 ENCOUNTER — Ambulatory Visit: Payer: Medicare HMO | Admitting: Adult Health

## 2018-07-23 DIAGNOSIS — M797 Fibromyalgia: Secondary | ICD-10-CM | POA: Diagnosis not present

## 2018-07-23 DIAGNOSIS — M150 Primary generalized (osteo)arthritis: Secondary | ICD-10-CM | POA: Diagnosis not present

## 2018-07-23 DIAGNOSIS — Z6829 Body mass index (BMI) 29.0-29.9, adult: Secondary | ICD-10-CM | POA: Diagnosis not present

## 2018-07-23 DIAGNOSIS — M0589 Other rheumatoid arthritis with rheumatoid factor of multiple sites: Secondary | ICD-10-CM | POA: Diagnosis not present

## 2018-07-23 DIAGNOSIS — E663 Overweight: Secondary | ICD-10-CM | POA: Diagnosis not present

## 2018-07-23 DIAGNOSIS — M5030 Other cervical disc degeneration, unspecified cervical region: Secondary | ICD-10-CM | POA: Diagnosis not present

## 2018-07-23 DIAGNOSIS — M7021 Olecranon bursitis, right elbow: Secondary | ICD-10-CM | POA: Diagnosis not present

## 2018-07-24 ENCOUNTER — Ambulatory Visit (INDEPENDENT_AMBULATORY_CARE_PROVIDER_SITE_OTHER)
Admission: RE | Admit: 2018-07-24 | Discharge: 2018-07-24 | Disposition: A | Discharge status: Home / Self Care | Payer: Medicare HMO | Source: Ambulatory Visit | Attending: Adult Health | Admitting: Adult Health

## 2018-07-24 DIAGNOSIS — I712 Thoracic aortic aneurysm, without rupture, unspecified: Secondary | ICD-10-CM

## 2018-07-24 MED ORDER — IOPAMIDOL (ISOVUE-370) INJECTION 76%
100.0000 mL | Freq: Once | INTRAVENOUS | Status: AC | PRN
  Administered 2018-07-24: 100 mL via INTRAVENOUS

## 2018-07-27 DIAGNOSIS — I10 Essential (primary) hypertension: Secondary | ICD-10-CM | POA: Diagnosis not present

## 2018-07-27 DIAGNOSIS — H5203 Hypermetropia, bilateral: Secondary | ICD-10-CM | POA: Diagnosis not present

## 2018-08-01 NOTE — Progress Notes (Signed)
Cardiology Office Note   Date:  08/03/2018   ID:  Elijahjames Fuelling, DOB 1951-01-08, MRN [id number]  PCP:  Biagio Borg, MD  Cardiologist:  Dr. Ellyn Hack Chief Complaint  Patient presents with  . Coronary Artery Disease  . Chest Pain  . Hypertension     History of Present Illness: Delman Goshorn is a 67 y.o. male who presents for ongoing assessment and management of coronary artery disease, catheterization in 2019 revealed ostial and proximal circumflex stenosis which was treated with PTCA.  The patient is intolerant of statins and is now on PCSK9 therapy with Repatha, followed in our office by our pharmacist.  He also has a history of a 4.6 cm AAA followed by CVTS for ongoing surveillance, hypertension, hyperlipidemia, and multiple medical problems.  He was last seen in our office on 07/07/2018 with multiple complaints to include difficult to control hypertension chronic pain in his neck had not been taking his medications consistently.  He was unable to continue Repatha as he did not qualify for this.  Due to his symptoms I repeated his stress test using a Lexiscan Myoview for evaluation of progressive CAD, he was re-furred back to pharmacy as he now has Medicare and may be able to afford this treatment regimen.  He was continued on his current medication regimen at losartan 25 mg daily.  He has been taking extra doses due to hypertension at home.  Stress test completed on 07/16/2018 was low risk, patient was informed.  He is without complaints today.   Past Medical History:  Diagnosis Date  . ALLERGIC RHINITIS 04/30/2007  . ANEMIA-NOS 07/12/2007  . ANXIETY 02/17/2009  . Coronary artery disease involving native coronary artery of native heart with unstable angina pectoris ([place]) 05/10/2011  . DEPRESSIVE DISORDER 02/14/2009  . GERD (gastroesophageal reflux disease)   . History of pulmonary embolism 03/09/2008  . HYPERLIPIDEMIA 04/27/2007  . HYPERTENSION 04/27/2007  . INSOMNIA UNSPECIFIED  02/20/2010  . Lupus ([place])    "w/RA" (08/20/2017)  . 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 Migraine    "stopped in the early 2000s" (08/20/2017)  . Non-ST elevation (NSTEMI) myocardial infarction ([place]) -TYPE 4A peri-PCI 08/21/2017  . Nonspecific abnormal toxicological findings 03/14/2008  . NSTEMI (non-ST elevated myocardial infarction) ([place]) 08/20/2017  . OBESITY 04/30/2007  . OSTEOARTHRITIS 04/30/2007  . 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 Presence of drug coated stent in left circumflex coronary artery 08/21/2017  . PROSTATITIS, HX OF 04/30/2007  . RESTRICTIVE LUNG DISEASE 06/20/2009  . Rheumatoid arthritis ([place])   . THORACIC AORTIC ANEURYSM 07/06/2009   Chest CT 11/12: ascending thoracic aortic aneurysm stable at 42 mm.   Marland Kitchen UNSPECIFIED MYALGIA AND MYOSITIS 02/14/2009    Past Surgical History:  Procedure Laterality Date  . APPENDECTOMY    . BACK SURGERY    . CARDIAC CATHETERIZATION  06/2008   Non-obstructive CAD.  EF 40-45%. Anterior - apical HK  . CORONARY ANGIOPLASTY WITH STENT PLACEMENT  08/20/2017  . CORONARY STENT INTERVENTION N/A 08/20/2017   Procedure: CORONARY STENT INTERVENTION;  Surgeon: [name], MD;  Location: [facility];  Service: Cardiovascular;  Laterality: N/A;  . LEFT HEART CATH AND CORONARY ANGIOGRAPHY N/A 08/20/2017   Procedure: LEFT HEART CATH AND CORONARY ANGIOGRAPHY;  Surgeon: [name], MD;  Location: [facility];  Service: Cardiovascular;  Laterality: N/A;  . NM MYOVIEW LTD  06/2009   Abnormal stress nuclear study with small prior lateral infarct; no ischemia. Normal EF  . TRANSTHORACIC ECHOCARDIOGRAM  06/2009  55% to 60%. Wall motion was normal.  Mod Dilated Aoi Root.  Mild LA dilation.     Current Outpatient Medications  Medication Sig Dispense Refill  . aspirin 81 MG EC tablet Take 1 tablet (81 mg total) by mouth daily. 30 tablet 11  . clonazePAM (KLONOPIN) 1 MG tablet take 1 tablet by mouth twice a day if needed for anxiety  60  tablet 3  . hydroxychloroquine (PLAQUENIL) 200 MG tablet Take 200 mg by mouth 2 (two) times daily.     Marland Kitchen leflunomide (ARAVA) 20 MG tablet Take 20 mg by mouth daily.      Marland Kitchen losartan (COZAAR) 25 MG tablet Take 1 tablet (25 mg total) by mouth daily. 30 tablet 6  . metoprolol tartrate (LOPRESSOR) 25 MG tablet Take 1 tablet (25 mg total) by mouth 2 (two) times daily. 120 tablet 2  . omeprazole (PRILOSEC) 20 MG capsule Take 1 capsule (20 mg total) by mouth daily. 90 capsule 3  . sildenafil (VIAGRA) 100 MG tablet TAKE 1/2 TO 1 TABLET BY MOUTH EVERY DAY 5 tablet 10  . ticagrelor (BRILINTA) 90 MG TABS tablet Take 1 tablet (90 mg total) by mouth 2 (two) times daily. 60 tablet 6  . zolpidem (AMBIEN) 10 MG tablet Take 1 tablet (10 mg total) by mouth at bedtime as needed for sleep. 90 tablet 1   No current facility-administered medications for this visit.     Allergies:   Atorvastatin; Ciprofloxacin; Crestor [rosuvastatin]; Doxycycline; and Hydroxychloroquine sulfate    Social History:  The patient  reports that he has never smoked. He has never used smokeless tobacco. He reports that he does not drink alcohol or use drugs.   Family History:  The patient's family history includes Alcohol abuse in an other family member; Diabetes in his brother, brother, father, mother, and another family member; Heart disease in his brother, father, mother, and another family member; Hyperlipidemia in his father and mother; Hypertension in his brother, sister, and another family member; Stroke in his father, mother, and another family member.    ROS: All other systems are reviewed and negative. Unless otherwise mentioned in H&P    PHYSICAL EXAM: VS:  BP 110/80   Pulse 82   Ht 6' (1.829 m)   Wt 205 lb (93 kg)   SpO2 92%   BMI 27.80 kg/m  , BMI Body mass index is 27.8 kg/m. GEN: Well nourished, well developed, in no acute distress HEENT: normal Neck: no JVD, carotid bruits, or masses Cardiac: RRR; no murmurs,  rubs, or gallops,no edema  Respiratory:  Clear to auscultation bilaterally, normal work of breathing GI: soft, nontender, nondistended, + BS MS: no deformity or atrophy Skin: warm and dry, no rash Neuro:  Strength and sensation are intact Psych: euthymic mood, full affect   EKG: Not completed this office visit.   Recent Labs: 04/07/2018: ALT 26; Hemoglobin 13.5; Platelets 285.0; TSH 1.39 07/07/2018: BUN 16; Creatinine, Ser 1.35; Potassium 4.9; Sodium 140    Lipid Panel    Component Value Date/Time   CHOL 195 04/07/2018 1015   TRIG 134.0 04/07/2018 1015   TRIG 63 08/15/2006 0721   HDL 37.80 (L) 04/07/2018 1015   CHOLHDL 5 04/07/2018 1015   VLDL 26.8 04/07/2018 1015   LDLCALC 130 (H) 04/07/2018 1015   LDLDIRECT 131.0 04/03/2017 1156      Wt Readings from Last 3 Encounters:  08/03/18 205 lb (93 kg)  07/16/18 203 lb (92.1 kg)  07/07/18 203 lb 12.8  oz (92.4 kg)      Other studies Reviewed: Carlton Adam Myoview 07/16/2018 Study Highlights    The left ventricular ejection fraction is mildly decreased (45-54%).  Nuclear stress EF cacluated at 47% but visually appears to be 55%.  There was no ST segment deviation noted during stress.  There is a medium defect of moderate severity present in the basal inferior, basal inferolateral, mid inferior and mid inferolateral location. The defect is minimally reversible in the inferolateral wall. In the setting of normal wall motion in the inferior and inferolateral walls, this is likely consistent with diaphragmatic attenuation artifact but could cannot rule out a small area of inferolateral ischemia.  This is a low risk study.     ASSESSMENT AND PLAN:  1. Chest pain: NM study was negative for ischemia. Reassurance given. No further testing.   2. CAD: Ostial and mid Cx stenosis per cath in 08/2017. He continues on Brilinta, ASA and BB.Marland Kitchen He can stop Brilinta at the end of January 2020.   3. Hypertension:  BP is well controlled  currently. Continue losartan.    Current medicines are reviewed at length with the patient today.    Labs/ tests ordered today include: None  Phill Myron. West Pugh, ANP, AACC   08/03/2018 4:09 PM    [address] [address] Office [phone] Fax [phone]

## 2018-08-03 ENCOUNTER — Ambulatory Visit: Payer: Medicare HMO | Admitting: Adult Health

## 2018-08-03 ENCOUNTER — Encounter: Payer: Self-pay | Admitting: Adult Health

## 2018-08-03 VITALS — BP 110/80 | HR 82 | Ht 72.0 in | Wt 205.0 lb

## 2018-08-03 DIAGNOSIS — I10 Essential (primary) hypertension: Secondary | ICD-10-CM

## 2018-08-03 DIAGNOSIS — E7800 Pure hypercholesterolemia, unspecified: Secondary | ICD-10-CM

## 2018-08-03 DIAGNOSIS — I2510 Atherosclerotic heart disease of native coronary artery without angina pectoris: Secondary | ICD-10-CM

## 2018-08-03 NOTE — Patient Instructions (Addendum)
Follow-Up: You will need a follow up appointment in 6 months.  Please call our office 2 months  (April 2020) in advance to schedule this appointment (June 2020).  You may see Glenetta Hew, MD   Jory Sims, DNP, AACC or one of the following Advanced Practice Providers on your designated Care Team:    Jory Sims, DNP, [place], PA-C   Medication Instructions:  Kriste Basque- Your physician recommends that you continue on your current medications as directed. Please refer to the Current Medication list given to you today. If you need a refill on your cardiac medications before your next appointment, please call your pharmacy.  Labwork: When you have labs (blood work) and your tests are completely normal, you will receive your results ONLY by [place] (if you have MyChart) -OR- A paper copy in the mail.  At [facility], you and your health needs are our priority.  As part of our continuing mission to provide you with exceptional heart care, we have created designated Provider Care Teams.  These Care Teams include your primary Cardiologist (physician) and Advanced Practice Providers (APPs -  Physician Assistants and Nurse Practitioners) who all work together to provide you with the care you need, when you need it.  Thank you for choosing [facility] at [facility]!!

## 2018-08-11 ENCOUNTER — Ambulatory Visit: Payer: Medicare HMO | Admitting: Adult Health

## 2018-08-14 DIAGNOSIS — G5761 Lesion of plantar nerve, right lower limb: Secondary | ICD-10-CM | POA: Diagnosis not present

## 2018-08-14 DIAGNOSIS — M7751 Other enthesopathy of right foot: Secondary | ICD-10-CM | POA: Diagnosis not present

## 2018-08-27 DIAGNOSIS — M0520 Rheumatoid vasculitis with rheumatoid arthritis of unspecified site: Secondary | ICD-10-CM | POA: Diagnosis not present

## 2018-08-27 DIAGNOSIS — M329 Systemic lupus erythematosus, unspecified: Secondary | ICD-10-CM | POA: Diagnosis not present

## 2018-08-27 DIAGNOSIS — Z961 Presence of intraocular lens: Secondary | ICD-10-CM | POA: Diagnosis not present

## 2018-08-27 DIAGNOSIS — Z79899 Other long term (current) drug therapy: Secondary | ICD-10-CM | POA: Diagnosis not present

## 2018-08-27 DIAGNOSIS — H26491 Other secondary cataract, right eye: Secondary | ICD-10-CM | POA: Diagnosis not present

## 2018-10-06 ENCOUNTER — Ambulatory Visit: Payer: Medicare HMO | Admitting: Internal Medicine

## 2018-10-15 ENCOUNTER — Other Ambulatory Visit: Payer: Self-pay | Admitting: Internal Medicine

## 2018-10-15 DIAGNOSIS — I10 Essential (primary) hypertension: Secondary | ICD-10-CM

## 2018-10-15 DIAGNOSIS — I252 Old myocardial infarction: Secondary | ICD-10-CM

## 2018-10-15 NOTE — Telephone Encounter (Signed)
Done erx 

## 2018-10-17 ENCOUNTER — Other Ambulatory Visit: Payer: Self-pay | Admitting: Internal Medicine

## 2018-10-19 NOTE — Telephone Encounter (Signed)
Done erx 

## 2018-10-21 DIAGNOSIS — M0589 Other rheumatoid arthritis with rheumatoid factor of multiple sites: Secondary | ICD-10-CM | POA: Diagnosis not present

## 2018-12-15 ENCOUNTER — Other Ambulatory Visit: Payer: Self-pay | Admitting: Internal Medicine

## 2018-12-15 DIAGNOSIS — I10 Essential (primary) hypertension: Secondary | ICD-10-CM

## 2018-12-15 DIAGNOSIS — I252 Old myocardial infarction: Secondary | ICD-10-CM

## 2019-01-20 DIAGNOSIS — M0589 Other rheumatoid arthritis with rheumatoid factor of multiple sites: Secondary | ICD-10-CM | POA: Diagnosis not present

## 2019-01-20 DIAGNOSIS — Z6829 Body mass index (BMI) 29.0-29.9, adult: Secondary | ICD-10-CM | POA: Diagnosis not present

## 2019-01-20 DIAGNOSIS — M7021 Olecranon bursitis, right elbow: Secondary | ICD-10-CM | POA: Diagnosis not present

## 2019-01-20 DIAGNOSIS — M797 Fibromyalgia: Secondary | ICD-10-CM | POA: Diagnosis not present

## 2019-01-20 DIAGNOSIS — E663 Overweight: Secondary | ICD-10-CM | POA: Diagnosis not present

## 2019-01-20 DIAGNOSIS — M5030 Other cervical disc degeneration, unspecified cervical region: Secondary | ICD-10-CM | POA: Diagnosis not present

## 2019-01-20 DIAGNOSIS — M150 Primary generalized (osteo)arthritis: Secondary | ICD-10-CM | POA: Diagnosis not present

## 2019-01-21 DIAGNOSIS — R69 Illness, unspecified: Secondary | ICD-10-CM | POA: Diagnosis not present

## 2019-02-12 ENCOUNTER — Telehealth: Payer: Self-pay | Admitting: Internal Medicine

## 2019-02-12 NOTE — Telephone Encounter (Signed)
Patient has dropped off a renewal parking placard.  Form has been completed & Placed in providers box to review and sign.

## 2019-02-15 NOTE — Telephone Encounter (Signed)
Form has been signed, Copy sent to scan.  Patient informed, &Will pick up original.

## 2019-03-12 ENCOUNTER — Other Ambulatory Visit: Payer: Self-pay | Admitting: Internal Medicine

## 2019-04-05 ENCOUNTER — Other Ambulatory Visit: Payer: Self-pay | Admitting: Internal Medicine

## 2019-04-05 NOTE — Telephone Encounter (Signed)
Done erx  shirron to let pt know - please make ROV for further refills

## 2019-04-18 DIAGNOSIS — R69 Illness, unspecified: Secondary | ICD-10-CM | POA: Diagnosis not present

## 2019-04-22 DIAGNOSIS — M0589 Other rheumatoid arthritis with rheumatoid factor of multiple sites: Secondary | ICD-10-CM | POA: Diagnosis not present

## 2019-05-04 ENCOUNTER — Other Ambulatory Visit: Payer: Self-pay | Admitting: Internal Medicine

## 2019-05-04 DIAGNOSIS — Z8711 Personal history of peptic ulcer disease: Secondary | ICD-10-CM

## 2019-05-13 ENCOUNTER — Telehealth: Payer: Self-pay | Admitting: Internal Medicine

## 2019-05-13 NOTE — Telephone Encounter (Signed)
Done erx  Please let pt know he is due for ROV for further refills

## 2019-06-08 ENCOUNTER — Other Ambulatory Visit: Payer: Self-pay | Admitting: Internal Medicine

## 2019-06-08 MED ORDER — SILDENAFIL CITRATE 100 MG PO TABS: ORAL_TABLET | ORAL | 11 refills | Status: DC

## 2019-06-08 MED ORDER — CLONAZEPAM 1 MG PO TABS: ORAL_TABLET | ORAL | 0 refills | Status: DC

## 2019-06-08 MED ORDER — LOSARTAN POTASSIUM 50 MG PO TABS: 50.0000 mg | ORAL_TABLET | Freq: Every day | ORAL | 0 refills | Status: DC

## 2019-06-08 MED ORDER — ZOLPIDEM TARTRATE 10 MG PO TABS: 10.0000 mg | ORAL_TABLET | Freq: Every day | ORAL | 0 refills | Status: DC

## 2019-06-08 NOTE — Telephone Encounter (Signed)
The office policy has been blurred among the new providers, but I have always followed the original policy for the last 24 yrs  OK for all routine med refills on 90 day basis for up to 1 year, then month to month for 3 months with warnings to make ROV   It is UNETHICAL to cut patients off of routine meds at 12 months since there is no gaurantee in real life that all patients can return "on time"  I have refilled the clonazepam and zolpidem; ok to do rest as above, thanks

## 2019-06-08 NOTE — Telephone Encounter (Signed)
Pt informed of below and verbalized understanding.  

## 2019-06-08 NOTE — Telephone Encounter (Signed)
All of Patient prescriptions were denied and patient wouldlike to know why .    Please advise   Refused Medication Requests    clonazePAM take 1 tablet by mouth twice daily as needed for anxiety   Protocol Details   Losartan Potassium 50 MG TAKE ONE TABLET BY MOUTH ONE TIME DAILY    Protocol Details   Zolpidem Tartrate 10 MG TAKE ONE TABLET BY MOUTH DAILY AT BEDTIME    Protocol Details   Sildenafil Citrate 100 MG TAKE HALF TO ONE TABLET BY MOUTH DAILY    Protocol Details

## 2019-06-08 NOTE — Telephone Encounter (Signed)
I called pt and informed him most likely his refills were denied because his LOV with PCP was 04/07/18. Our office policy is that patients must be seen at least annually for refill authorizations. He is scheduled with you on 07/08/19. He wants to know if you will rx enough of all meds requested below to last until that OV? Please advise.

## 2019-07-08 ENCOUNTER — Encounter: Payer: Self-pay | Admitting: Internal Medicine

## 2019-07-08 ENCOUNTER — Other Ambulatory Visit: Payer: Self-pay

## 2019-07-08 ENCOUNTER — Other Ambulatory Visit (INDEPENDENT_AMBULATORY_CARE_PROVIDER_SITE_OTHER): Payer: Medicare HMO

## 2019-07-08 ENCOUNTER — Ambulatory Visit (INDEPENDENT_AMBULATORY_CARE_PROVIDER_SITE_OTHER): Payer: Medicare HMO | Admitting: Internal Medicine

## 2019-07-08 VITALS — BP 126/84 | HR 65 | Temp 98.1°F | Ht 72.0 in | Wt 204.0 lb

## 2019-07-08 DIAGNOSIS — R739 Hyperglycemia, unspecified: Secondary | ICD-10-CM | POA: Diagnosis not present

## 2019-07-08 DIAGNOSIS — E611 Iron deficiency: Secondary | ICD-10-CM

## 2019-07-08 DIAGNOSIS — Z1159 Encounter for screening for other viral diseases: Secondary | ICD-10-CM

## 2019-07-08 DIAGNOSIS — E538 Deficiency of other specified B group vitamins: Secondary | ICD-10-CM | POA: Diagnosis not present

## 2019-07-08 DIAGNOSIS — Z0000 Encounter for general adult medical examination without abnormal findings: Secondary | ICD-10-CM | POA: Diagnosis not present

## 2019-07-08 DIAGNOSIS — E559 Vitamin D deficiency, unspecified: Secondary | ICD-10-CM | POA: Diagnosis not present

## 2019-07-08 DIAGNOSIS — E782 Mixed hyperlipidemia: Secondary | ICD-10-CM

## 2019-07-08 LAB — LIPID PANEL
Cholesterol: 214 mg/dL — ABNORMAL HIGH (ref 0–200)
HDL: 32.3 mg/dL — ABNORMAL LOW (ref >39.00)
LDL Cholesterol: 151 mg/dL — ABNORMAL HIGH (ref 0–99)
NonHDL: 181.37
Total CHOL/HDL Ratio: 7
Triglycerides: 150 mg/dL — ABNORMAL HIGH (ref 0.0–149.0)
VLDL: 30 mg/dL (ref 0.0–40.0)

## 2019-07-08 LAB — URINALYSIS, ROUTINE W REFLEX MICROSCOPIC
Bilirubin Urine: NEGATIVE
Hgb urine dipstick: NEGATIVE
Ketones, ur: NEGATIVE
Leukocytes,Ua: NEGATIVE
Nitrite: NEGATIVE
RBC / HPF: NONE SEEN (ref 0–?)
Specific Gravity, Urine: 1.02 (ref 1.000–1.030)
Total Protein, Urine: NEGATIVE
Urine Glucose: NEGATIVE
Urobilinogen, UA: 0.2 (ref 0.0–1.0)
WBC, UA: NONE SEEN (ref 0–?)
pH: 6 (ref 5.0–8.0)

## 2019-07-08 LAB — PSA: PSA: 1.21 ng/mL (ref 0.10–4.00)

## 2019-07-08 LAB — BASIC METABOLIC PANEL
BUN: 18 mg/dL (ref 6–23)
CO2: 27 mEq/L (ref 19–32)
Calcium: 9.7 mg/dL (ref 8.4–10.5)
Chloride: 103 mEq/L (ref 96–112)
Creatinine, Ser: 1.33 mg/dL (ref 0.40–1.50)
GFR: 53.74 mL/min — ABNORMAL LOW (ref >60.00)
Glucose, Bld: 100 mg/dL — ABNORMAL HIGH (ref 70–99)
Potassium: 4.4 mEq/L (ref 3.5–5.1)
Sodium: 140 mEq/L (ref 135–145)

## 2019-07-08 LAB — CBC WITH DIFFERENTIAL/PLATELET
Basophils Absolute: 0.1 10*3/uL (ref 0.0–0.1)
Basophils Relative: 2.3 % (ref 0.0–3.0)
Eosinophils Absolute: 0.2 10*3/uL (ref 0.0–0.7)
Eosinophils Relative: 4.1 % (ref 0.0–5.0)
HCT: 40 % (ref 39.0–52.0)
Hemoglobin: 13.3 g/dL (ref 13.0–17.0)
Lymphocytes Relative: 28.7 % (ref 12.0–46.0)
Lymphs Abs: 1.7 10*3/uL (ref 0.7–4.0)
MCHC: 33.4 g/dL (ref 30.0–36.0)
MCV: 88.1 fl (ref 78.0–100.0)
Monocytes Absolute: 0.7 10*3/uL (ref 0.1–1.0)
Monocytes Relative: 11.5 % (ref 3.0–12.0)
Neutro Abs: 3.2 10*3/uL (ref 1.4–7.7)
Neutrophils Relative %: 53.4 % (ref 43.0–77.0)
Platelets: 273 10*3/uL (ref 150.0–400.0)
RBC: 4.54 Mil/uL (ref 4.22–5.81)
RDW: 13.3 % (ref 11.5–15.5)
WBC: 6.1 10*3/uL (ref 4.0–10.5)

## 2019-07-08 LAB — IBC PANEL
Iron: 80 ug/dL (ref 42–165)
Saturation Ratios: 20.4 % (ref 20.0–50.0)
Transferrin: 280 mg/dL (ref 212.0–360.0)

## 2019-07-08 LAB — HEPATIC FUNCTION PANEL
ALT: 47 U/L (ref 0–53)
AST: 32 U/L (ref 0–37)
Albumin: 4.3 g/dL (ref 3.5–5.2)
Alkaline Phosphatase: 92 U/L (ref 39–117)
Bilirubin, Direct: 0.1 mg/dL (ref 0.0–0.3)
Total Bilirubin: 0.7 mg/dL (ref 0.2–1.2)
Total Protein: 6.8 g/dL (ref 6.0–8.3)

## 2019-07-08 LAB — TSH: TSH: 1.07 u[IU]/mL (ref 0.35–4.50)

## 2019-07-08 LAB — VITAMIN D 25 HYDROXY (VIT D DEFICIENCY, FRACTURES): VITD: >120 ng/mL

## 2019-07-08 LAB — HEMOGLOBIN A1C: Hgb A1c MFr Bld: 5.4 % (ref 4.6–6.5)

## 2019-07-08 LAB — VITAMIN B12: Vitamin B-12: 284 pg/mL (ref 211–911)

## 2019-07-08 NOTE — Patient Instructions (Signed)
You will be contacted regarding the referral for: colonoscopy  Please continue all other medications as before, and refills have been done if requested.  Please have the pharmacy call with any other refills you may need.  Please continue your efforts at being more active, low cholesterol diet, and weight control.  You are otherwise up to date with prevention measures today.  Please keep your appointments with your specialists as you may have planned  Please go to the LAB in the Basement (turn left off the elevator) for the tests to be done today  You will be contacted by phone if any changes need to be made immediately.  Otherwise, you will receive a letter about your results with an explanation, but please check with MyChart first.  Please remember to sign up for MyChart if you have not done so, as this will be important to you in the future with finding out test results, communicating by private email, and scheduling acute appointments online when needed.  Please return in 1 year for your yearly visit, or sooner if needed, with Lab testing done 3-5 days before

## 2019-07-08 NOTE — Progress Notes (Signed)
Subjective:    Patient ID: [name] Michael, male    DOB: 1951-07-17, 68 y.o.   MRN: [id number]  HPI  Here for wellness and f/u;  Overall doing ok;  Pt denies Chest pain, worsening SOB, DOE, wheezing, orthopnea, PND, worsening LE edema, palpitations, dizziness or syncope.  Pt denies neurological change such as new headache, facial or extremity weakness.  Pt denies polydipsia, polyuria, or low sugar symptoms. Pt states overall good compliance with treatment and medications, good tolerability, and has been trying to follow appropriate diet.  Pt denies worsening depressive symptoms, suicidal ideation or panic. No fever, night sweats, wt loss, loss of appetite, or other constitutional symptoms.  Pt states good ability with ADL's, has low fall risk, home safety reviewed and adequate, no other significant changes in hearing or vision, and only occasionally active with exercise.  No new complaints Past Medical History:  Diagnosis Date  . ALLERGIC RHINITIS 04/30/2007  . ANEMIA-NOS 07/12/2007  . ANXIETY 02/17/2009  . Coronary artery disease involving native coronary artery of native heart with unstable angina pectoris ([place]) 05/10/2011  . DEPRESSIVE DISORDER 02/14/2009  . GERD (gastroesophageal reflux disease)   . History of pulmonary embolism 03/09/2008  . HYPERLIPIDEMIA 04/27/2007  . HYPERTENSION 04/27/2007  . INSOMNIA UNSPECIFIED 02/20/2010  . Lupus ([place])    "w/RA" (08/20/2017)  . 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 Migraine    "stopped in the early 2000s" (08/20/2017)  . Non-ST elevation (NSTEMI) myocardial infarction ([place]) -TYPE 4A peri-PCI 08/21/2017  . Nonspecific abnormal toxicological findings 03/14/2008  . NSTEMI (non-ST elevated myocardial infarction) ([place]) 08/20/2017  . OBESITY 04/30/2007  . OSTEOARTHRITIS 04/30/2007  . 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 Presence of drug coated stent in left circumflex coronary artery 08/21/2017  . PROSTATITIS, HX OF 04/30/2007  . RESTRICTIVE LUNG DISEASE 06/20/2009   . Rheumatoid arthritis ([place])   . THORACIC AORTIC ANEURYSM 07/06/2009   Chest CT 11/12: ascending thoracic aortic aneurysm stable at 42 mm.   Marland Kitchen UNSPECIFIED MYALGIA AND MYOSITIS 02/14/2009   Past Surgical History:  Procedure Laterality Date  . APPENDECTOMY    . BACK SURGERY    . CARDIAC CATHETERIZATION  06/2008   Non-obstructive CAD.  EF 40-45%. Anterior - apical HK  . CORONARY ANGIOPLASTY WITH STENT PLACEMENT  08/20/2017  . CORONARY STENT INTERVENTION N/A 08/20/2017   Procedure: CORONARY STENT INTERVENTION;  Surgeon: [name], MD;  Location: [facility];  Service: Cardiovascular;  Laterality: N/A;  . LEFT HEART CATH AND CORONARY ANGIOGRAPHY N/A 08/20/2017   Procedure: LEFT HEART CATH AND CORONARY ANGIOGRAPHY;  Surgeon: [name], MD;  Location: [facility];  Service: Cardiovascular;  Laterality: N/A;  . NM MYOVIEW LTD  06/2009   Abnormal stress nuclear study with small prior lateral infarct; no ischemia. Normal EF  . TRANSTHORACIC ECHOCARDIOGRAM  06/2009   55% to 60%. Wall motion was normal.  Mod Dilated Aoi Root.  Mild LA dilation.    reports that he has never smoked. He has never used smokeless tobacco. He reports that he does not drink alcohol or use drugs. family history includes Alcohol abuse in an other family member; Diabetes in his brother, brother, father, mother, and another family member; Heart disease in his brother, father, mother, and another family member; Hyperlipidemia in his father and mother; Hypertension in his brother, sister, and another family member; Stroke in his father, mother, and another family member. Allergies  Allergen Reactions  . Atorvastatin     REACTION: myalgias  .  Ciprofloxacin Other (See Comments)    myalgias  . Crestor [Rosuvastatin]     myalgias  . Doxycycline   . Hydroxychloroquine Sulfate     REACTION: GI upset and H, pt is currently taking and still has some GI problems but is tolerating it   Current Outpatient  Medications on File Prior to Visit  Medication Sig Dispense Refill  . aspirin 81 MG EC tablet Take 1 tablet (81 mg total) by mouth daily. 30 tablet 11  . clonazePAM (KLONOPIN) 1 MG tablet TAKE ONE TABLET BY MOUTH TWICE DAILY AS NEEDED for anxiety 60 tablet 0  . hydroxychloroquine (PLAQUENIL) 200 MG tablet Take 200 mg by mouth 2 (two) times daily.     Marland Kitchen leflunomide (ARAVA) 20 MG tablet Take 20 mg by mouth daily.      Marland Kitchen losartan (COZAAR) 50 MG tablet Take 1 tablet (50 mg total) by mouth daily. 90 tablet 0  . metoprolol tartrate (LOPRESSOR) 25 MG tablet TAKE ONE TABLET BY MOUTH TWICE DAILY  120 tablet 3  . omeprazole (PRILOSEC) 20 MG capsule TAKE ONE CAPSULE BY MOUTH ONE TIME DAILY  90 capsule 0  . sildenafil (VIAGRA) 100 MG tablet TAKE ONE-HALF TO ONE TABLET BY MOUTH EVERY DAY 10 tablet 11  . ticagrelor (BRILINTA) 90 MG TABS tablet Take 1 tablet (90 mg total) by mouth 2 (two) times daily. 60 tablet 6  . zolpidem (AMBIEN) 10 MG tablet Take 1 tablet (10 mg total) by mouth at bedtime. 30 tablet 0   No current facility-administered medications on file prior to visit.    Review of Systems Constitutional: Negative for other unusual diaphoresis, sweats, appetite or weight changes HENT: Negative for other worsening hearing loss, ear pain, facial swelling, mouth sores or neck stiffness.   Eyes: Negative for other worsening pain, redness or other visual disturbance.  Respiratory: Negative for other stridor or swelling Cardiovascular: Negative for other palpitations or other chest pain  Gastrointestinal: Negative for worsening diarrhea or loose stools, blood in stool, distention or other pain Genitourinary: Negative for hematuria, flank pain or other change in urine volume.  Musculoskeletal: Negative for myalgias or other joint swelling.  Skin: Negative for other color change, or other wound or worsening drainage.  Neurological: Negative for other syncope or numbness. Hematological: Negative for other  adenopathy or swelling Psychiatric/Behavioral: Negative for hallucinations, other worsening agitation, SI, self-injury, or new decreased concentration All otherwise neg per pt     Objective:   Physical Exam BP 126/84   Pulse 65   Temp 98.1 F (36.7 C) (Oral)   Ht 6' (1.829 m)   Wt 204 lb (92.5 kg)   SpO2 97%   BMI 27.67 kg/m  VS noted,  Constitutional: Pt is oriented to person, place, and time. Appears well-developed and well-nourished, in no significant distress and comfortable Head: Normocephalic and atraumatic  Eyes: Conjunctivae and EOM are normal. Pupils are equal, round, and reactive to light Right Ear: External ear normal without discharge Left Ear: External ear normal without discharge Nose: Nose without discharge or deformity Mouth/Throat: Oropharynx is without other ulcerations and moist  Neck: Normal range of motion. Neck supple. No JVD present. No tracheal deviation present or significant neck LA or mass Cardiovascular: Normal rate, regular rhythm, normal heart sounds and intact distal pulses.   Pulmonary/Chest: WOB normal and breath sounds without rales or wheezing  Abdominal: Soft. Bowel sounds are normal. NT. No HSM  Musculoskeletal: Normal range of motion. Exhibits no edema Lymphadenopathy: Has no other  cervical adenopathy.  Neurological: Pt is alert and oriented to person, place, and time. Pt has normal reflexes. No cranial nerve deficit. Motor grossly intact, Gait intact Skin: Skin is warm and dry. No rash noted or new ulcerations Psychiatric:  Has normal mood and affect. Behavior is normal without agitation All otherwise neg per pt  Lab Results  Component Value Date   WBC 6.1 07/08/2019   HGB 13.3 07/08/2019   HCT 40.0 07/08/2019   PLT 273.0 07/08/2019   GLUCOSE 100 (H) 07/08/2019   CHOL 214 (H) 07/08/2019   TRIG 150.0 (H) 07/08/2019   HDL 32.30 (L) 07/08/2019   LDLDIRECT 131.0 04/03/2017   LDLCALC 151 (H) 07/08/2019   ALT 47 07/08/2019   AST 32  07/08/2019   NA 140 07/08/2019   K 4.4 07/08/2019   CL 103 07/08/2019   CREATININE 1.33 07/08/2019   BUN 18 07/08/2019   CO2 27 07/08/2019   TSH 1.07 07/08/2019   PSA 1.21 07/08/2019   INR 1.00 08/21/2017   HGBA1C 5.4 07/08/2019      Assessment & Plan:

## 2019-07-09 LAB — HEPATITIS C ANTIBODY
Hepatitis C Ab: NONREACTIVE
SIGNAL TO CUT-OFF: 0.01 (ref <1.00)

## 2019-07-10 ENCOUNTER — Encounter: Payer: Self-pay | Admitting: Internal Medicine

## 2019-07-10 NOTE — Assessment & Plan Note (Signed)
stable overall by history and exam, recent data reviewed with pt, and pt to continue medical treatment as before,  to f/u any worsening symptoms or concerns  

## 2019-07-10 NOTE — Assessment & Plan Note (Signed)

## 2019-07-10 NOTE — Assessment & Plan Note (Signed)
For low chol diet, has been statin intolerant 

## 2019-07-15 ENCOUNTER — Other Ambulatory Visit: Payer: Self-pay | Admitting: Internal Medicine

## 2019-07-15 DIAGNOSIS — R69 Illness, unspecified: Secondary | ICD-10-CM | POA: Diagnosis not present

## 2019-07-15 NOTE — Telephone Encounter (Signed)
Done erx 

## 2019-07-16 ENCOUNTER — Encounter: Payer: Self-pay | Admitting: Gastroenterology

## 2019-07-22 DIAGNOSIS — M0589 Other rheumatoid arthritis with rheumatoid factor of multiple sites: Secondary | ICD-10-CM | POA: Diagnosis not present

## 2019-07-22 DIAGNOSIS — M150 Primary generalized (osteo)arthritis: Secondary | ICD-10-CM | POA: Diagnosis not present

## 2019-07-22 DIAGNOSIS — Z6829 Body mass index (BMI) 29.0-29.9, adult: Secondary | ICD-10-CM | POA: Diagnosis not present

## 2019-07-22 DIAGNOSIS — M5030 Other cervical disc degeneration, unspecified cervical region: Secondary | ICD-10-CM | POA: Diagnosis not present

## 2019-07-22 DIAGNOSIS — M7021 Olecranon bursitis, right elbow: Secondary | ICD-10-CM | POA: Diagnosis not present

## 2019-07-22 DIAGNOSIS — E663 Overweight: Secondary | ICD-10-CM | POA: Diagnosis not present

## 2019-07-22 DIAGNOSIS — M797 Fibromyalgia: Secondary | ICD-10-CM | POA: Diagnosis not present

## 2019-07-23 ENCOUNTER — Other Ambulatory Visit: Payer: Self-pay | Admitting: *Deleted

## 2019-08-03 ENCOUNTER — Other Ambulatory Visit: Payer: Self-pay | Admitting: Internal Medicine

## 2019-08-03 DIAGNOSIS — Z8711 Personal history of peptic ulcer disease: Secondary | ICD-10-CM

## 2019-08-07 ENCOUNTER — Other Ambulatory Visit: Payer: Self-pay | Admitting: Internal Medicine

## 2019-08-07 DIAGNOSIS — I10 Essential (primary) hypertension: Secondary | ICD-10-CM

## 2019-08-07 DIAGNOSIS — I252 Old myocardial infarction: Secondary | ICD-10-CM

## 2019-08-09 ENCOUNTER — Ambulatory Visit (AMBULATORY_SURGERY_CENTER): Payer: Medicare HMO | Admitting: *Deleted

## 2019-08-09 ENCOUNTER — Other Ambulatory Visit: Payer: Self-pay

## 2019-08-09 VITALS — Temp 97.1°F | Ht 72.0 in | Wt 207.0 lb

## 2019-08-09 DIAGNOSIS — Z1159 Encounter for screening for other viral diseases: Secondary | ICD-10-CM

## 2019-08-09 DIAGNOSIS — Z1211 Encounter for screening for malignant neoplasm of colon: Secondary | ICD-10-CM

## 2019-08-09 DIAGNOSIS — Z86010 Personal history of colonic polyps: Secondary | ICD-10-CM

## 2019-08-09 MED ORDER — SUPREP BOWEL PREP KIT 17.5-3.13-1.6 GM/177ML PO SOLN: 1.0000 | Freq: Once | ORAL | 0 refills | Status: AC

## 2019-08-09 NOTE — Progress Notes (Signed)

## 2019-08-11 ENCOUNTER — Encounter: Payer: Self-pay | Admitting: Gastroenterology

## 2019-08-20 ENCOUNTER — Encounter: Payer: Medicare HMO | Admitting: Gastroenterology

## 2019-08-25 ENCOUNTER — Encounter: Payer: Medicare HMO | Admitting: Gastroenterology

## 2019-09-09 ENCOUNTER — Encounter: Payer: Medicare HMO | Admitting: Gastroenterology

## 2019-10-04 DIAGNOSIS — C801 Malignant (primary) neoplasm, unspecified: Secondary | ICD-10-CM

## 2019-10-04 HISTORY — DX: Malignant (primary) neoplasm, unspecified: C80.1

## 2019-10-04 HISTORY — PX: SURGERY OF LIP: SUR1315

## 2019-10-05 ENCOUNTER — Other Ambulatory Visit: Payer: Self-pay | Admitting: Internal Medicine

## 2019-10-05 NOTE — Telephone Encounter (Signed)
Please refill as per office routine med refill policy (all routine meds refilled for 3 mo or monthly per pt preference up to one year from last visit, then month to month grace period for 3 mo, then further med refills will have to be denied)  

## 2019-10-20 DIAGNOSIS — M0589 Other rheumatoid arthritis with rheumatoid factor of multiple sites: Secondary | ICD-10-CM | POA: Diagnosis not present

## 2019-11-10 ENCOUNTER — Encounter: Payer: Self-pay | Admitting: General Practice

## 2020-01-11 ENCOUNTER — Other Ambulatory Visit: Payer: Self-pay | Admitting: Internal Medicine

## 2020-01-11 NOTE — Telephone Encounter (Signed)
Done erx 

## 2020-01-17 DIAGNOSIS — R69 Illness, unspecified: Secondary | ICD-10-CM | POA: Diagnosis not present

## 2020-01-20 DIAGNOSIS — M150 Primary generalized (osteo)arthritis: Secondary | ICD-10-CM | POA: Diagnosis not present

## 2020-01-20 DIAGNOSIS — M0589 Other rheumatoid arthritis with rheumatoid factor of multiple sites: Secondary | ICD-10-CM | POA: Diagnosis not present

## 2020-01-20 DIAGNOSIS — M7021 Olecranon bursitis, right elbow: Secondary | ICD-10-CM | POA: Diagnosis not present

## 2020-01-20 DIAGNOSIS — Z6829 Body mass index (BMI) 29.0-29.9, adult: Secondary | ICD-10-CM | POA: Diagnosis not present

## 2020-01-20 DIAGNOSIS — E663 Overweight: Secondary | ICD-10-CM | POA: Diagnosis not present

## 2020-01-20 DIAGNOSIS — M5030 Other cervical disc degeneration, unspecified cervical region: Secondary | ICD-10-CM | POA: Diagnosis not present

## 2020-01-20 DIAGNOSIS — M797 Fibromyalgia: Secondary | ICD-10-CM | POA: Diagnosis not present

## 2020-01-28 DIAGNOSIS — C4402 Squamous cell carcinoma of skin of lip: Secondary | ICD-10-CM | POA: Diagnosis not present

## 2020-02-16 DIAGNOSIS — Z85828 Personal history of other malignant neoplasm of skin: Secondary | ICD-10-CM | POA: Diagnosis not present

## 2020-02-16 DIAGNOSIS — C4402 Squamous cell carcinoma of skin of lip: Secondary | ICD-10-CM | POA: Diagnosis not present

## 2020-03-09 ENCOUNTER — Other Ambulatory Visit: Payer: Self-pay | Admitting: Internal Medicine

## 2020-03-09 NOTE — Telephone Encounter (Signed)
Done erx 

## 2020-04-24 DIAGNOSIS — M0589 Other rheumatoid arthritis with rheumatoid factor of multiple sites: Secondary | ICD-10-CM | POA: Diagnosis not present

## 2020-05-02 IMAGING — CT CT ANGIO CHEST
2 of 7 series · 18 of 46 positions shown · IV contrast (iopamidol)
Comparison: Prior chest CT 08/20/2017

CLINICAL DATA: 67-year-old male with a thoracic aortic aneurysm

EXAM:
CT ANGIOGRAPHY CHEST WITH CONTRAST
TECHNIQUE: Multidetector CT imaging of the chest was performed using the
standard protocol during bolus administration of intravenous
contrast. Multiplanar CT image reconstructions and MIPs were
obtained to evaluate the vascular anatomy.
CONTRAST:  100mL [id number] IOPAMIDOL (IMK072-5YN) INJECTION 76%

[Series 4: aorta 3.0 i31f 2 · axial · 0.74mm/px · z∈[+1395,+1683]mm · 15 of 104 slices shown]
[im 4/104  lung]
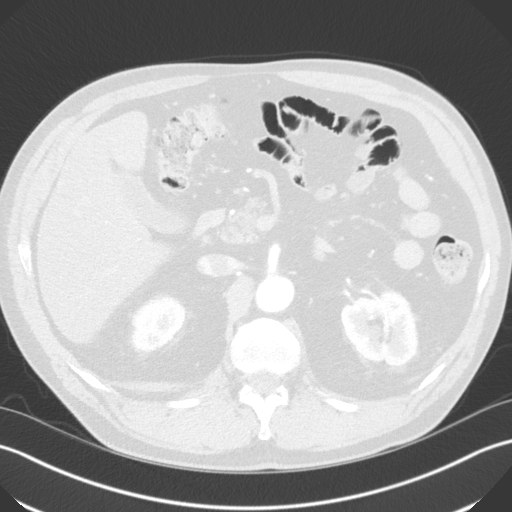
[im 12/104  soft-tissue]
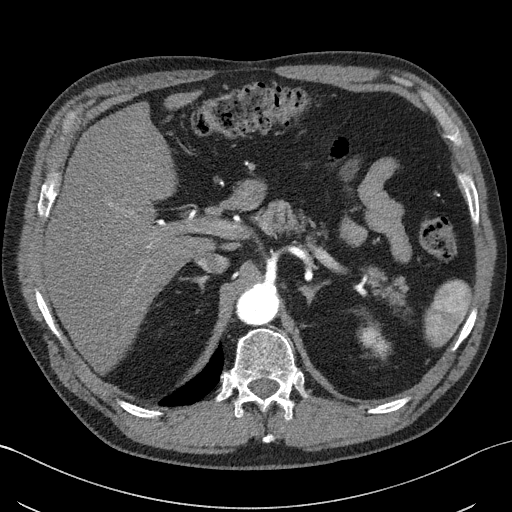
[im 19/104  lung]
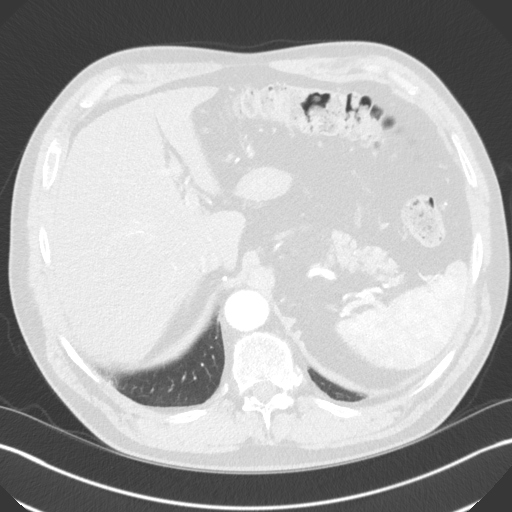
[im 26/104  soft-tissue]
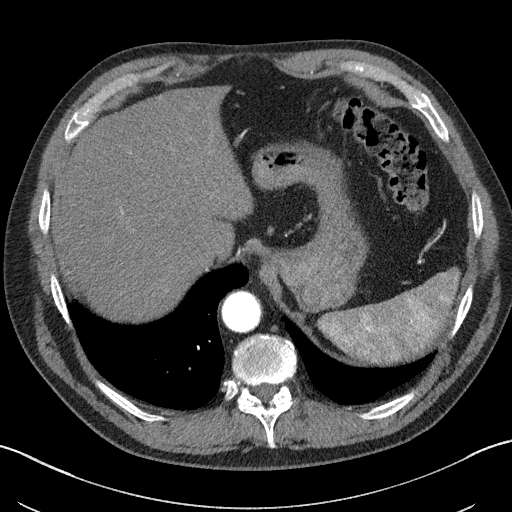
[im 34/104  lung]
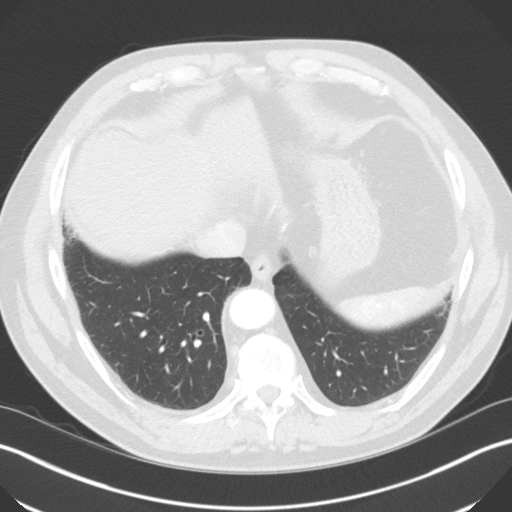
[im 37/104  soft-tissue]
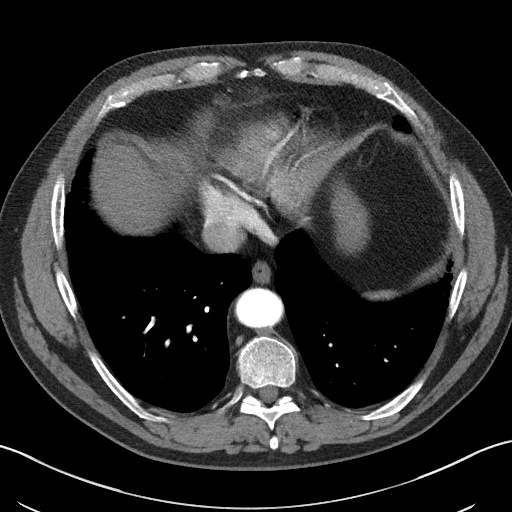
[im 45/104  lung]
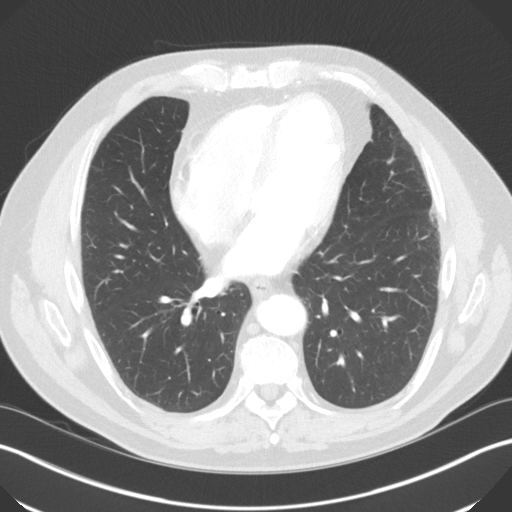
[im 52/104  soft-tissue]
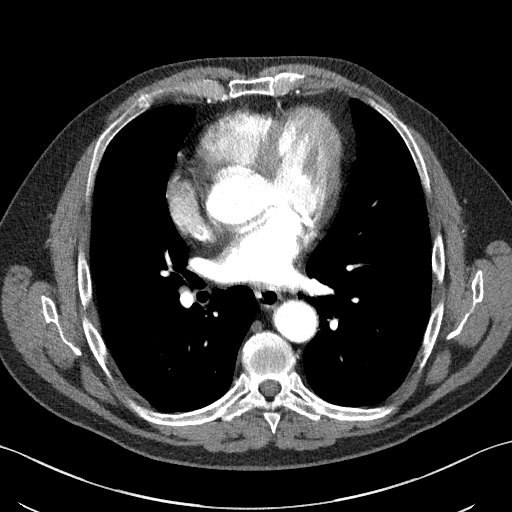
[im 59/104  lung]
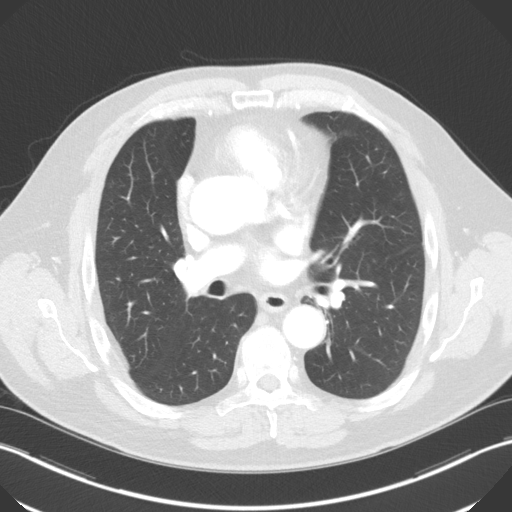
[im 67/104  soft-tissue]
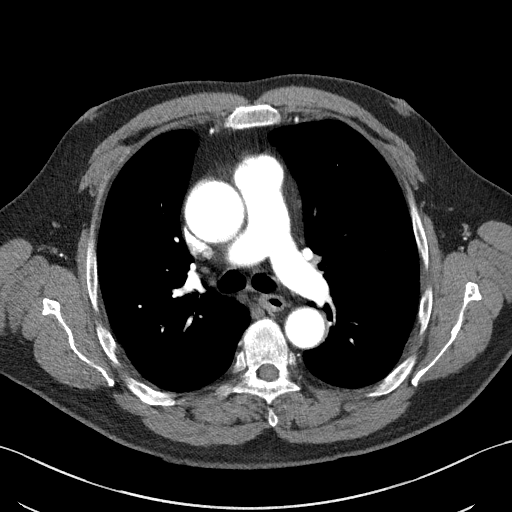
[im 70/104  lung]
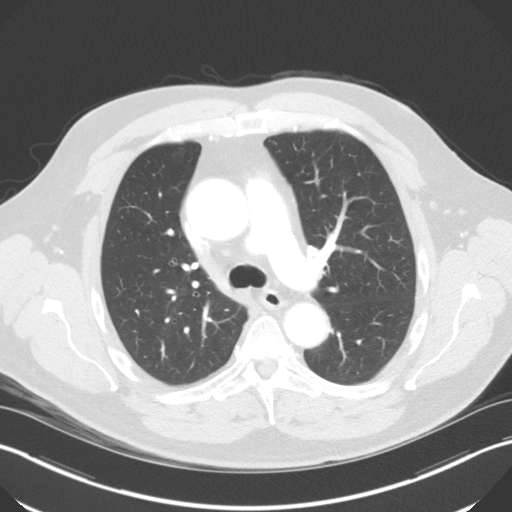
[im 78/104  soft-tissue]
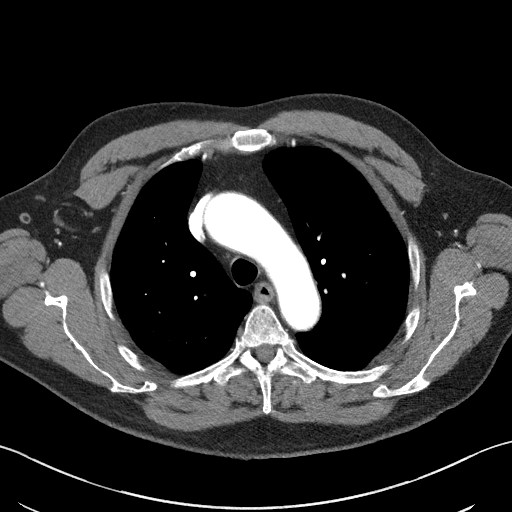
[im 85/104  lung]
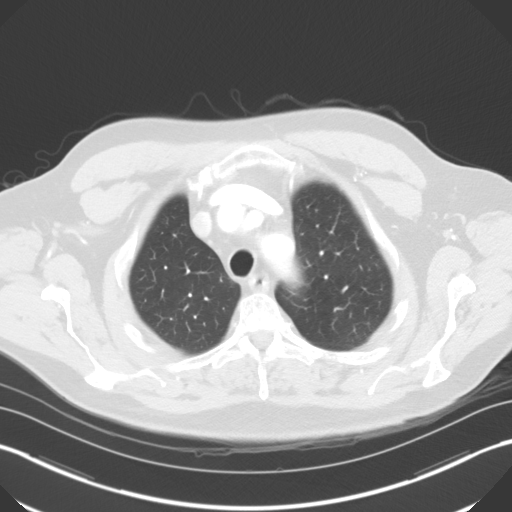
[im 92/104  soft-tissue]
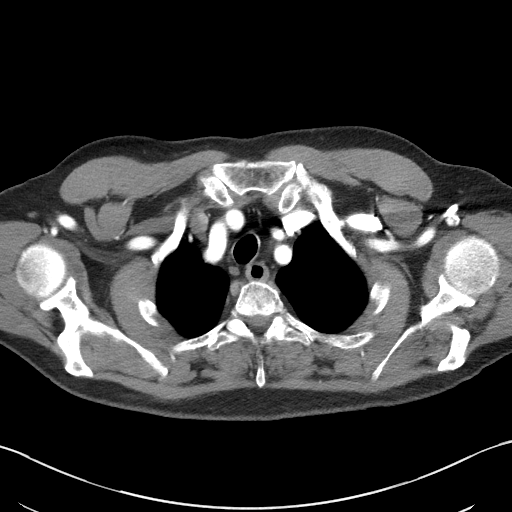
[im 100/104  lung]
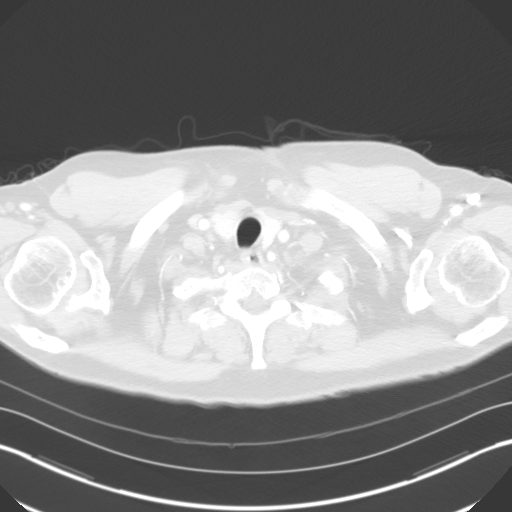

[Series 7: coronals · coronal · 0.62mm/px · 3 of 151 slices shown]
[im 38/151  soft-tissue]
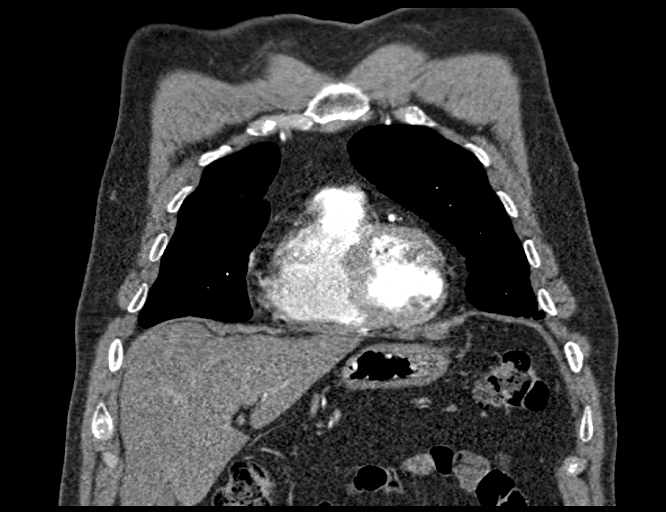
[im 76/151  soft-tissue]
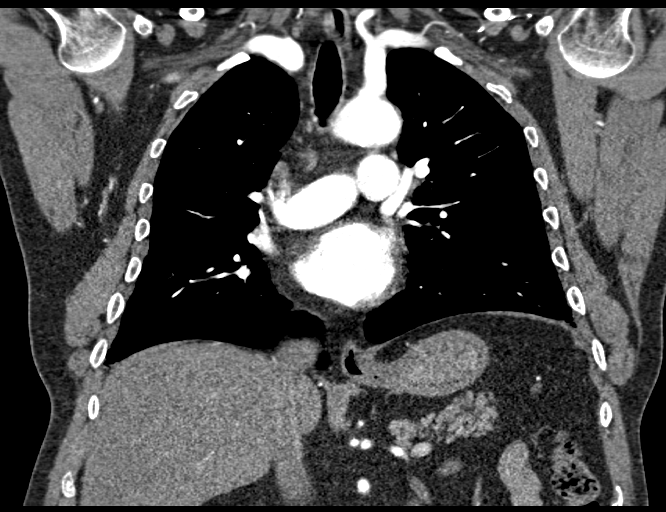
[im 113/151  soft-tissue]
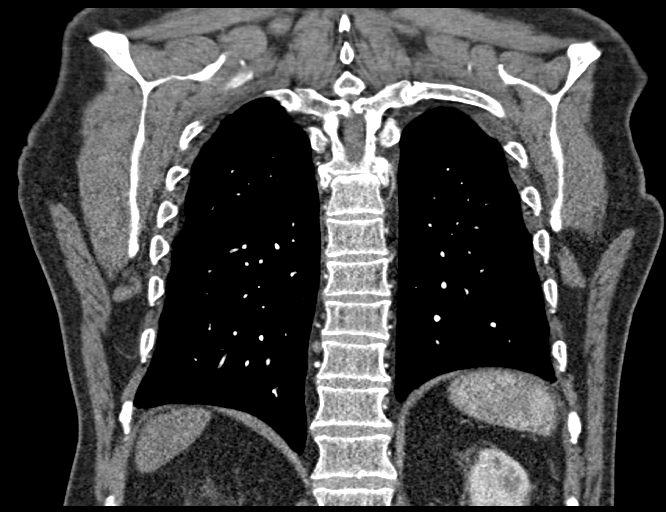

[18 of 46 positions shown; findings below may reference images not displayed]

FINDINGS: Cardiovascular: Conventional 3 vessel arch anatomy. Fusiform
aneurysmal dilatation of the ascending thoracic aorta with a maximal
transverse diameter of 4.6 cm, unchanged. No effacement of the
[place] junction. Calcifications again noted in the coronary
arteries. The pulmonary artery is normal in size. The heart is
normal in size. No pericardial effusion. Unremarkable pulmonary
venous drainage pattern.

Mediastinum/Nodes: Unremarkable CT appearance of the thyroid gland.
No suspicious mediastinal or hilar adenopathy. No soft tissue
mediastinal mass. The thoracic esophagus is unremarkable.

Lungs/Pleura: Lungs are clear. No pleural effusion or pneumothorax.

Upper Abdomen: No acute abnormality.

Musculoskeletal: No acute fracture or aggressive appearing lytic or
blastic osseous lesion.

Review of the MIP images confirms the above findings.
IMPRESSION: Stable fusiform aneurysmal dilatation of the ascending thoracic
aorta with a maximal transverse diameter of 4.6 cm. Ascending
thoracic aortic aneurysm. Recommend semi-annual imaging followup by
CTA or MRA and referral to cardiothoracic surgery if not already
obtained. This recommendation follows 2848
ACCF/AHA/AATS/ACR/ASA/SCA/ARMBE/NIVIRUS/TSUMAKE/SORIN Guidelines for the
Diagnosis and Management of Patients With Thoracic Aortic Disease.
Circulation. 2848; 121: E266-e369. Aortic aneurysm NOS
([id number]).

## 2020-05-31 DIAGNOSIS — Z85828 Personal history of other malignant neoplasm of skin: Secondary | ICD-10-CM | POA: Diagnosis not present

## 2020-05-31 DIAGNOSIS — Z79899 Other long term (current) drug therapy: Secondary | ICD-10-CM | POA: Diagnosis not present

## 2020-05-31 DIAGNOSIS — L720 Epidermal cyst: Secondary | ICD-10-CM | POA: Diagnosis not present

## 2020-05-31 DIAGNOSIS — L821 Other seborrheic keratosis: Secondary | ICD-10-CM | POA: Diagnosis not present

## 2020-05-31 DIAGNOSIS — R944 Abnormal results of kidney function studies: Secondary | ICD-10-CM | POA: Diagnosis not present

## 2020-05-31 DIAGNOSIS — L570 Actinic keratosis: Secondary | ICD-10-CM | POA: Diagnosis not present

## 2020-07-10 ENCOUNTER — Other Ambulatory Visit: Payer: Self-pay | Admitting: Internal Medicine

## 2020-07-11 ENCOUNTER — Telehealth: Payer: Self-pay

## 2020-07-11 ENCOUNTER — Ambulatory Visit (INDEPENDENT_AMBULATORY_CARE_PROVIDER_SITE_OTHER): Payer: Medicare HMO | Admitting: Internal Medicine

## 2020-07-11 ENCOUNTER — Other Ambulatory Visit: Payer: Self-pay

## 2020-07-11 ENCOUNTER — Encounter: Payer: Self-pay | Admitting: Internal Medicine

## 2020-07-11 VITALS — BP 140/90 | HR 76 | Temp 98.2°F | Ht 72.0 in | Wt 210.0 lb

## 2020-07-11 DIAGNOSIS — E782 Mixed hyperlipidemia: Secondary | ICD-10-CM | POA: Diagnosis not present

## 2020-07-11 DIAGNOSIS — Z0000 Encounter for general adult medical examination without abnormal findings: Secondary | ICD-10-CM

## 2020-07-11 DIAGNOSIS — Z86010 Personal history of colonic polyps: Secondary | ICD-10-CM | POA: Diagnosis not present

## 2020-07-11 DIAGNOSIS — R739 Hyperglycemia, unspecified: Secondary | ICD-10-CM | POA: Diagnosis not present

## 2020-07-11 DIAGNOSIS — E538 Deficiency of other specified B group vitamins: Secondary | ICD-10-CM | POA: Diagnosis not present

## 2020-07-11 DIAGNOSIS — G720 Drug-induced myopathy: Secondary | ICD-10-CM | POA: Insufficient documentation

## 2020-07-11 DIAGNOSIS — I10 Essential (primary) hypertension: Secondary | ICD-10-CM | POA: Diagnosis not present

## 2020-07-11 DIAGNOSIS — E559 Vitamin D deficiency, unspecified: Secondary | ICD-10-CM

## 2020-07-11 DIAGNOSIS — Z23 Encounter for immunization: Secondary | ICD-10-CM

## 2020-07-11 DIAGNOSIS — N1831 Chronic kidney disease, stage 3a: Secondary | ICD-10-CM | POA: Diagnosis not present

## 2020-07-11 DIAGNOSIS — Z0001 Encounter for general adult medical examination with abnormal findings: Secondary | ICD-10-CM | POA: Diagnosis not present

## 2020-07-11 LAB — LIPID PANEL
Cholesterol: 201 mg/dL — ABNORMAL HIGH (ref 0–200)
HDL: 36 mg/dL — ABNORMAL LOW (ref >39.00)
LDL Cholesterol: 139 mg/dL — ABNORMAL HIGH (ref 0–99)
NonHDL: 164.99
Total CHOL/HDL Ratio: 6
Triglycerides: 131 mg/dL (ref 0.0–149.0)
VLDL: 26.2 mg/dL (ref 0.0–40.0)

## 2020-07-11 LAB — CBC WITH DIFFERENTIAL/PLATELET
Basophils Absolute: 0.2 10*3/uL — ABNORMAL HIGH (ref 0.0–0.1)
Basophils Relative: 1.3 % (ref 0.0–3.0)
Eosinophils Absolute: 0.3 10*3/uL (ref 0.0–0.7)
Eosinophils Relative: 2.2 % (ref 0.0–5.0)
HCT: 41.7 % (ref 39.0–52.0)
Hemoglobin: 13.7 g/dL (ref 13.0–17.0)
Lymphocytes Relative: 16.2 % (ref 12.0–46.0)
Lymphs Abs: 2.1 10*3/uL (ref 0.7–4.0)
MCHC: 33 g/dL (ref 30.0–36.0)
MCV: 87 fl (ref 78.0–100.0)
Monocytes Absolute: 1.1 10*3/uL — ABNORMAL HIGH (ref 0.1–1.0)
Monocytes Relative: 8.8 % (ref 3.0–12.0)
Neutro Abs: 9.3 10*3/uL — ABNORMAL HIGH (ref 1.4–7.7)
Neutrophils Relative %: 71.5 % (ref 43.0–77.0)
Platelets: 280 10*3/uL (ref 150.0–400.0)
RBC: 4.79 Mil/uL (ref 4.22–5.81)
RDW: 13.5 % (ref 11.5–15.5)
WBC: 13 10*3/uL — ABNORMAL HIGH (ref 4.0–10.5)

## 2020-07-11 LAB — HEPATIC FUNCTION PANEL
ALT: 55 U/L — ABNORMAL HIGH (ref 0–53)
AST: 38 U/L — ABNORMAL HIGH (ref 0–37)
Albumin: 4.3 g/dL (ref 3.5–5.2)
Alkaline Phosphatase: 69 U/L (ref 39–117)
Bilirubin, Direct: 0.1 mg/dL (ref 0.0–0.3)
Total Bilirubin: 0.7 mg/dL (ref 0.2–1.2)
Total Protein: 7 g/dL (ref 6.0–8.3)

## 2020-07-11 LAB — URINALYSIS, ROUTINE W REFLEX MICROSCOPIC
Bilirubin Urine: NEGATIVE
Hgb urine dipstick: NEGATIVE
Ketones, ur: NEGATIVE
Leukocytes,Ua: NEGATIVE
Nitrite: NEGATIVE
RBC / HPF: NONE SEEN (ref 0–?)
Specific Gravity, Urine: 1.025 (ref 1.000–1.030)
Total Protein, Urine: NEGATIVE
Urine Glucose: NEGATIVE
Urobilinogen, UA: 0.2 (ref 0.0–1.0)
WBC, UA: NONE SEEN (ref 0–?)
pH: 6 (ref 5.0–8.0)

## 2020-07-11 LAB — BASIC METABOLIC PANEL
BUN: 18 mg/dL (ref 6–23)
CO2: 27 mEq/L (ref 19–32)
Calcium: 10 mg/dL (ref 8.4–10.5)
Chloride: 102 mEq/L (ref 96–112)
Creatinine, Ser: 1.4 mg/dL (ref 0.40–1.50)
GFR: 52.07 mL/min — ABNORMAL LOW (ref >60.00)
Glucose, Bld: 88 mg/dL (ref 70–99)
Potassium: 4.5 mEq/L (ref 3.5–5.1)
Sodium: 138 mEq/L (ref 135–145)

## 2020-07-11 LAB — VITAMIN B12: Vitamin B-12: 214 pg/mL (ref 211–911)

## 2020-07-11 LAB — PSA: PSA: 1.7 ng/mL (ref 0.10–4.00)

## 2020-07-11 LAB — HEMOGLOBIN A1C: Hgb A1c MFr Bld: 5.5 % (ref 4.6–6.5)

## 2020-07-11 LAB — VITAMIN D 25 HYDROXY (VIT D DEFICIENCY, FRACTURES): VITD: >120 ng/mL

## 2020-07-11 LAB — TSH: TSH: 1.09 u[IU]/mL (ref 0.35–4.50)

## 2020-07-11 MED ORDER — LOSARTAN POTASSIUM 25 MG PO TABS: 25.0000 mg | ORAL_TABLET | Freq: Every day | ORAL | 3 refills | Status: DC

## 2020-07-11 NOTE — Patient Instructions (Addendum)
You had the flu shot today  Ok to decrease the losartan to 25 mg per day  You will be contacted regarding the referral for: colonoscopy  Please continue all other medications as before, and refills have been done if requested.  Please have the pharmacy call with any other refills you may need.  Please continue your efforts at being more active, low cholesterol diet, and weight control.  You are otherwise up to date with prevention measures today.  Please keep your appointments with your specialists as you may have planned  Please go to the LAB at the blood drawing area for the tests to be done  You will be contacted by phone if any changes need to be made immediately.  Otherwise, you will receive a letter about your results with an explanation, but please check with MyChart first.  Please remember to sign up for MyChart if you have not done so, as this will be important to you in the future with finding out test results, communicating by private email, and scheduling acute appointments online when needed.  Please make an Appointment to return for your 1 year visit, or sooner if needed

## 2020-07-11 NOTE — Telephone Encounter (Signed)
CRITICAL VALUE STICKER  CRITICAL VALUE: Vitamin D >20  RECEIVER (on-site recipient of call): Elza Rafter rnc   DATE & TIME NOTIFIED: 07/11/20 at 1209  MESSENGER (representative from lab): Hope  MD NOTIFIED: Dr Jenny Reichmann  TIME OF NOTIFICATION: 1215   RESPONSE: Awaiting response

## 2020-07-11 NOTE — Progress Notes (Signed)
Subjective:    Patient ID: [name] Michael, male    DOB: 06-14-1951, 69 y.o.   MRN: [id number]  HPI  Here for wellness and f/u;  Overall doing ok;  Pt denies Chest pain, worsening SOB, DOE, wheezing, orthopnea, PND, worsening LE edema, palpitations, dizziness or syncope.  Pt denies neurological change such as new headache, facial or extremity weakness.  Pt denies polydipsia, polyuria, or low sugar symptoms. Pt states overall good compliance with treatment and medications, good tolerability, and has been trying to follow appropriate diet.  Pt denies worsening depressive symptoms, suicidal ideation or panic. No fever, night sweats, wt loss, loss of appetite, or other constitutional symptoms.  Pt states good ability with ADL's, has low fall risk, home safety reviewed and adequate, no other significant changes in vision, and only occasionally active with exercise. Also Hearing has been mimldly worse, delcines ent or audiology for now. Bp has been borderline low most days on current meds, only took half med this am, bc full pill makes fatigue and HA.  Sees renal regularly.  Wt has been increased several lbs regularly. Wt Readings from Last 3 Encounters:  07/11/20 210 lb (95.3 kg)  08/09/19 207 lb (93.9 kg)  07/08/19 204 lb (92.5 kg)   Past Medical History:  Diagnosis Date  . ALLERGIC RHINITIS 04/30/2007  . Allergy    mild  . ANEMIA-NOS 07/12/2007  . ANXIETY 02/17/2009  . Blood transfusion without reported diagnosis    bleeding ulcers in 2008 or 2009  . Cataract    bilat removed   . Clotting disorder ([place])    PE 2009- pt unaware of this finding   . Coronary artery disease involving native coronary artery of native heart with unstable angina pectoris ([place]) 05/10/2011  . DEPRESSIVE DISORDER 02/14/2009  . Fibromyalgia   . GERD (gastroesophageal reflux disease)   . History of pulmonary embolism 03/09/2008  . HYPERLIPIDEMIA 04/27/2007  . HYPERTENSION 04/27/2007  . INSOMNIA UNSPECIFIED 02/20/2010  .  Lupus ([place])    "w/RA" (08/20/2017)  . Lupus ([place])   . 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 Migraine    "stopped in the early 2000s" (08/20/2017)  . Non-ST elevation (NSTEMI) myocardial infarction ([place]) -TYPE 4A peri-PCI 08/21/2017  . Nonspecific abnormal toxicological findings 03/14/2008  . NSTEMI (non-ST elevated myocardial infarction) ([place]) 08/20/2017  . OBESITY 04/30/2007  . OSTEOARTHRITIS 04/30/2007  . 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 Presence of drug coated stent in left circumflex coronary artery 08/21/2017  . PROSTATITIS, HX OF 04/30/2007  . RESTRICTIVE LUNG DISEASE 06/20/2009  . Rheumatoid arthritis ([place])   . THORACIC AORTIC ANEURYSM 07/06/2009   Chest CT 11/12: ascending thoracic aortic aneurysm stable at 42 mm.   Marland Kitchen UNSPECIFIED MYALGIA AND MYOSITIS 02/14/2009   Past Surgical History:  Procedure Laterality Date  . APPENDECTOMY    . BACK SURGERY  2017   cervical discectomy C3-C4   . CARDIAC CATHETERIZATION  06/2008   Non-obstructive CAD.  EF 40-45%. Anterior - apical HK  . COLONOSCOPY    . CORONARY ANGIOPLASTY WITH STENT PLACEMENT  08/20/2017  . CORONARY STENT INTERVENTION N/A 08/20/2017   Procedure: CORONARY STENT INTERVENTION;  Surgeon: [name], MD;  Location: [facility];  Service: Cardiovascular;  Laterality: N/A;  . LEFT HEART CATH AND CORONARY ANGIOGRAPHY N/A 08/20/2017   Procedure: LEFT HEART CATH AND CORONARY ANGIOGRAPHY;  Surgeon: [name], MD;  Location: [facility];  Service: Cardiovascular;  Laterality: N/A;  . NM MYOVIEW LTD  06/2009  Abnormal stress nuclear study with small prior lateral infarct; no ischemia. Normal EF  . POLYPECTOMY  04/14/2009   TA x 1  . TRANSTHORACIC ECHOCARDIOGRAM  06/2009   55% to 60%. Wall motion was normal.  Mod Dilated [place].  Mild LA dilation.  Marland Kitchen UPPER GASTROINTESTINAL ENDOSCOPY      reports that he has never smoked. He has never used smokeless tobacco. He reports that he does not drink alcohol and does not  use drugs. family history includes Alcohol abuse in an other family member; Diabetes in his brother, brother, father, mother, and another family member; Heart disease in his brother, father, mother, and another family member; Hyperlipidemia in his father and mother; Hypertension in his brother, sister, and another family member; Stroke in his father, mother, and another family member. Allergies  Allergen Reactions  . Atorvastatin     REACTION: myalgias  . Ciprofloxacin Other (See Comments)    myalgias  . Crestor [Rosuvastatin]     myalgias  . Doxycycline   . Hydroxychloroquine Sulfate     REACTION: GI upset and H, pt is currently taking and still has some GI problems but is tolerating it   Current Outpatient Medications on File Prior to Visit  Medication Sig Dispense Refill  . aspirin 81 MG EC tablet Take 1 tablet (81 mg total) by mouth daily. 30 tablet 11  . Cholecalciferol (VITAMIN D) 50 MCG (2000 UT) CAPS Take by mouth.    . clonazePAM (KLONOPIN) 1 MG tablet TAKE ONE TABLET BY MOUTH TWICE DAILY AS NEEDED FOR ANXIETY 60 tablet 3  . folic acid (FOLVITE) 665 MCG tablet Take 400 mcg by mouth daily.    . hydroxychloroquine (PLAQUENIL) 200 MG tablet Take 200 mg by mouth 2 (two) times daily.     Marland Kitchen leflunomide (ARAVA) 20 MG tablet Take 20 mg by mouth daily.      . metoprolol tartrate (LOPRESSOR) 25 MG tablet TAKE ONE TABLET BY MOUTH TWICE DAILY  180 tablet 3  . Omega-3 Fatty Acids (FISH OIL) 1200 MG CAPS Take by mouth.    . sildenafil (VIAGRA) 100 MG tablet TAKE ONE-HALF TO ONE TABLET BY MOUTH EVERY DAY 10 tablet 11  . Turmeric 500 MG CAPS Take by mouth.    . zolpidem (AMBIEN) 10 MG tablet TAKE ONE TABLET BY MOUTH DAILY AT BEDTIME 30 tablet 0   No current facility-administered medications on file prior to visit.   Review of Systems All otherwise neg per pt    Objective:   Physical Exam BP 140/90 (BP Location: Left Arm, Patient Position: Sitting, Cuff Size: Large)   Pulse 76   Temp  98.2 F (36.8 C) (Oral)   Ht 6' (1.829 m)   Wt 210 lb (95.3 kg)   SpO2 93%   BMI 28.48 kg/m  VS noted,  Constitutional: Pt appears in NAD HENT: Head: NCAT.  Right Ear: External ear normal.  Left Ear: External ear normal.  Eyes: . Pupils are equal, round, and reactive to light. Conjunctivae and EOM are normal Nose: without d/c or deformity Neck: Neck supple. Gross normal ROM Cardiovascular: Normal rate and regular rhythm.   Pulmonary/Chest: Effort normal and breath sounds without rales or wheezing.  Abd:  Soft, NT, ND, + BS, no organomegaly Neurological: Pt is alert. At baseline orientation, motor grossly intact Skin: Skin is warm. No rashes, other new lesions, no LE edema Psychiatric: Pt behavior is normal without agitation  All otherwise neg per pt Lab Results  Component  Value Date   WBC 13.0 (H) 07/11/2020   HGB 13.7 07/11/2020   HCT 41.7 07/11/2020   PLT 280.0 07/11/2020   GLUCOSE 88 07/11/2020   CHOL 201 (H) 07/11/2020   TRIG 131.0 07/11/2020   HDL 36.00 (L) 07/11/2020   LDLDIRECT 131.0 04/03/2017   LDLCALC 139 (H) 07/11/2020   ALT 55 (H) 07/11/2020   AST 38 (H) 07/11/2020   NA 138 07/11/2020   K 4.5 07/11/2020   CL 102 07/11/2020   CREATININE 1.40 07/11/2020   BUN 18 07/11/2020   CO2 27 07/11/2020   TSH 1.09 07/11/2020   PSA 1.70 07/11/2020   INR 1.00 08/21/2017   HGBA1C 5.5 07/11/2020      Assessment & Plan:

## 2020-07-11 NOTE — Telephone Encounter (Signed)
Ok to follow

## 2020-07-12 NOTE — Telephone Encounter (Signed)
No specific change in treatment or further evaluation needed

## 2020-07-13 ENCOUNTER — Other Ambulatory Visit: Payer: Self-pay | Admitting: Internal Medicine

## 2020-07-13 DIAGNOSIS — Z8711 Personal history of peptic ulcer disease: Secondary | ICD-10-CM

## 2020-07-15 ENCOUNTER — Encounter: Payer: Self-pay | Admitting: Internal Medicine

## 2020-07-15 NOTE — Assessment & Plan Note (Signed)

## 2020-07-15 NOTE — Assessment & Plan Note (Signed)
stable overall by history and exam, recent data reviewed with pt, and pt to continue medical treatment as before,  to f/u any worsening symptoms or concerns  

## 2020-07-15 NOTE — Assessment & Plan Note (Addendum)
Over controlled, for decreased losartan 25 qd  I spent 31 minutes in preparing to see the patient by review of recent labs, imaging and procedures, obtaining and reviewing separately obtained history, communicating with the patient and family or caregiver, ordering medications, tests or procedures, and documenting clinical information in the EHR including the differential Dx, treatment, and any further evaluation and other management of htn, hld, hyperglycemia, ckd

## 2020-07-18 DIAGNOSIS — R69 Illness, unspecified: Secondary | ICD-10-CM | POA: Diagnosis not present

## 2020-07-21 DIAGNOSIS — M87874 Other osteonecrosis, right foot: Secondary | ICD-10-CM | POA: Diagnosis not present

## 2020-07-21 DIAGNOSIS — M25571 Pain in right ankle and joints of right foot: Secondary | ICD-10-CM | POA: Diagnosis not present

## 2020-07-21 DIAGNOSIS — M65871 Other synovitis and tenosynovitis, right ankle and foot: Secondary | ICD-10-CM | POA: Diagnosis not present

## 2020-07-24 ENCOUNTER — Other Ambulatory Visit: Payer: Self-pay | Admitting: Internal Medicine

## 2020-07-24 DIAGNOSIS — I252 Old myocardial infarction: Secondary | ICD-10-CM

## 2020-07-24 DIAGNOSIS — I10 Essential (primary) hypertension: Secondary | ICD-10-CM

## 2020-07-24 NOTE — Telephone Encounter (Signed)
Please refill as per office routine med refill policy (all routine meds refilled for 3 mo or monthly per pt preference up to one year from last visit, then month to month grace period for 3 mo, then further med refills will have to be denied)  

## 2020-07-26 DIAGNOSIS — Z6830 Body mass index (BMI) 30.0-30.9, adult: Secondary | ICD-10-CM | POA: Diagnosis not present

## 2020-07-26 DIAGNOSIS — M797 Fibromyalgia: Secondary | ICD-10-CM | POA: Diagnosis not present

## 2020-07-26 DIAGNOSIS — M5030 Other cervical disc degeneration, unspecified cervical region: Secondary | ICD-10-CM | POA: Diagnosis not present

## 2020-07-26 DIAGNOSIS — M0589 Other rheumatoid arthritis with rheumatoid factor of multiple sites: Secondary | ICD-10-CM | POA: Diagnosis not present

## 2020-07-26 DIAGNOSIS — M150 Primary generalized (osteo)arthritis: Secondary | ICD-10-CM | POA: Diagnosis not present

## 2020-07-26 DIAGNOSIS — M7021 Olecranon bursitis, right elbow: Secondary | ICD-10-CM | POA: Diagnosis not present

## 2020-07-26 DIAGNOSIS — E669 Obesity, unspecified: Secondary | ICD-10-CM | POA: Diagnosis not present

## 2020-08-02 ENCOUNTER — Other Ambulatory Visit: Payer: Self-pay | Admitting: Internal Medicine

## 2020-08-02 NOTE — Telephone Encounter (Signed)
Done erx 

## 2020-08-11 DIAGNOSIS — M7671 Peroneal tendinitis, right leg: Secondary | ICD-10-CM | POA: Diagnosis not present

## 2020-08-11 DIAGNOSIS — M25571 Pain in right ankle and joints of right foot: Secondary | ICD-10-CM | POA: Diagnosis not present

## 2020-08-18 ENCOUNTER — Other Ambulatory Visit: Payer: Self-pay | Admitting: Internal Medicine

## 2020-09-21 ENCOUNTER — Other Ambulatory Visit: Payer: Self-pay

## 2020-09-21 ENCOUNTER — Ambulatory Visit (AMBULATORY_SURGERY_CENTER): Payer: Self-pay | Admitting: *Deleted

## 2020-09-21 VITALS — Ht 72.0 in | Wt 214.0 lb

## 2020-09-21 DIAGNOSIS — Z86010 Personal history of colonic polyps: Secondary | ICD-10-CM

## 2020-09-21 NOTE — Progress Notes (Signed)
Patient is here in-person for PV. Patient denies any allergies to eggs or soy. Patient denies any problems with anesthesia/sedation. Patient denies any oxygen use at home. Patient denies taking any diet/weight loss medications or blood thinners. Patient is not being treated for MRSA or C-diff. Patient is aware of our care-partner policy and OIODG-43 safety protocol.  COVID-19 vaccines completed x3, per patient.   Patient has suprep at home from last PV.

## 2020-09-29 ENCOUNTER — Encounter: Payer: Self-pay | Admitting: Gastroenterology

## 2020-09-30 ENCOUNTER — Other Ambulatory Visit: Payer: Self-pay | Admitting: Internal Medicine

## 2020-10-03 DIAGNOSIS — M24871 Other specific joint derangements of right ankle, not elsewhere classified: Secondary | ICD-10-CM | POA: Diagnosis not present

## 2020-10-05 ENCOUNTER — Encounter: Payer: Medicare HMO | Admitting: Gastroenterology

## 2020-10-06 ENCOUNTER — Other Ambulatory Visit: Payer: Self-pay

## 2020-10-06 ENCOUNTER — Ambulatory Visit (AMBULATORY_SURGERY_CENTER): Payer: Medicare HMO | Admitting: Gastroenterology

## 2020-10-06 ENCOUNTER — Encounter: Payer: Self-pay | Admitting: Gastroenterology

## 2020-10-06 VITALS — BP 94/66 | HR 72 | Temp 97.5°F | Resp 17 | Ht 72.0 in | Wt 214.0 lb

## 2020-10-06 DIAGNOSIS — D123 Benign neoplasm of transverse colon: Secondary | ICD-10-CM | POA: Diagnosis not present

## 2020-10-06 DIAGNOSIS — D125 Benign neoplasm of sigmoid colon: Secondary | ICD-10-CM

## 2020-10-06 DIAGNOSIS — D120 Benign neoplasm of cecum: Secondary | ICD-10-CM | POA: Diagnosis not present

## 2020-10-06 DIAGNOSIS — Z86010 Personal history of colonic polyps: Secondary | ICD-10-CM | POA: Diagnosis not present

## 2020-10-06 MED ORDER — SODIUM CHLORIDE 0.9 % IV SOLN: 500.0000 mL | Freq: Once | INTRAVENOUS | Status: DC

## 2020-10-06 NOTE — Progress Notes (Signed)
pt tolerated well. VSS. awake and to recovery. Report given to RN.  

## 2020-10-06 NOTE — Patient Instructions (Signed)
YOU HAD AN ENDOSCOPIC PROCEDURE TODAY AT THE [facility]:   Refer to the procedure report that was given to you for any specific questions about what was found during the examination.  If the procedure report does not answer your questions, please call your gastroenterologist to clarify.  If you requested that your care partner not be given the details of your procedure findings, then the procedure report has been included in a sealed envelope for you to review at your convenience later.  YOU SHOULD EXPECT: Some feelings of bloating in the abdomen. Passage of more gas than usual.  Walking can help get rid of the air that was put into your GI tract during the procedure and reduce the bloating. If you had a lower endoscopy (such as a colonoscopy or flexible sigmoidoscopy) you may notice spotting of blood in your stool or on the toilet paper. If you underwent a bowel prep for your procedure, you may not have a normal bowel movement for a few days.  Please Note:  You might notice some irritation and congestion in your nose or some drainage.  This is from the oxygen used during your procedure.  There is no need for concern and it should clear up in a day or so.  SYMPTOMS TO REPORT IMMEDIATELY:   Following lower endoscopy (colonoscopy or flexible sigmoidoscopy):  Excessive amounts of blood in the stool  Significant tenderness or worsening of abdominal pains  Swelling of the abdomen that is new, acute  Fever of 100F or higher   Following upper endoscopy (EGD)  Vomiting of blood or coffee ground material  New chest pain or pain under the shoulder blades  Painful or persistently difficult swallowing  New shortness of breath  Fever of 100F or higher  Black, tarry-looking stools  For urgent or emergent issues, a gastroenterologist can be reached at any hour by calling [phone]. Do not use MyChart messaging for urgent concerns.    DIET:  We do recommend a small meal at first, but  then you may proceed to your regular diet.  Drink plenty of fluids but you should avoid alcoholic beverages for 24 hours.  ACTIVITY:  You should plan to take it easy for the rest of today and you should NOT DRIVE or use heavy machinery until tomorrow (because of the sedation medicines used during the test).    FOLLOW UP: Our staff will call the number listed on your records 48-72 hours following your procedure to check on you and address any questions or concerns that you may have regarding the information given to you following your procedure. If we do not reach you, we will leave a message.  We will attempt to reach you two times.  During this call, we will ask if you have developed any symptoms of COVID 19. If you develop any symptoms (ie: fever, flu-like symptoms, shortness of breath, cough etc.) before then, please call [phone].  If you test positive for Covid 19 in the 2 weeks post procedure, please call and report this information to us.    If any biopsies were taken you will be contacted by phone or by letter within the next 1-3 weeks.  Please call us at [phone] if you have not heard about the biopsies in 3 weeks.    SIGNATURES/CONFIDENTIALITY: You and/or your care partner have signed paperwork which will be entered into your electronic medical record.  These signatures attest to the fact that that the information above on   your After Visit Summary has been reviewed and is understood.  Full responsibility of the confidentiality of this discharge information lies with you and/or your care-partner. 

## 2020-10-06 NOTE — Progress Notes (Signed)
[facility]  Pt's states no medical or surgical changes since previsit or office visit.  

## 2020-10-06 NOTE — Op Note (Signed)
[facility] Patient Name: Traveion Ruddock Procedure Date: 10/06/2020 9:32 AM MRN: [id number] Endoscopist: Remo Lipps P. Havery Moros , MD Age: 70 Referring MD:  Date of Birth: Sep 12, 1950 Gender: Male Account #: [id number] Procedure:                Colonoscopy Indications:              High risk colon cancer surveillance: Personal                            history of colonic polyps (adenoma removed in 2010) Medicines:                Monitored Anesthesia Care Procedure:                Pre-Anesthesia Assessment:                           - Prior to the procedure, a History and Physical                            was performed, and patient medications and                            allergies were reviewed. The patient's tolerance of                            previous anesthesia was also reviewed. The risks                            and benefits of the procedure and the sedation                            options and risks were discussed with the patient.                            All questions were answered, and informed consent                            was obtained. Prior Anticoagulants: The patient has                            taken no previous anticoagulant or antiplatelet                            agents. ASA Grade Assessment: III - A patient with                            severe systemic disease. After reviewing the risks                            and benefits, the patient was deemed in                            satisfactory condition to undergo the procedure.  After obtaining informed consent, the colonoscope                            was passed under direct vision. Throughout the                            procedure, the patient's blood pressure, pulse, and                            oxygen saturations were monitored continuously. The                            Colonoscope was introduced through the anus and                             advanced to the the cecum, identified by                            appendiceal orifice and ileocecal valve. The                            colonoscopy was performed without difficulty. The                            patient tolerated the procedure well. The quality                            of the bowel preparation was good. The ileocecal                            valve, appendiceal orifice, and rectum were                            photographed. Scope In: 9:40:42 AM Scope Out: 9:54:53 AM Scope Withdrawal Time: 0 hours 11 minutes 23 seconds  Total Procedure Duration: 0 hours 14 minutes 11 seconds  Findings:                 The perianal and digital rectal examinations were                            normal.                           A 3 mm polyp was found in the cecum. The polyp was                            sessile. The polyp was removed with a cold snare.                            Resection and retrieval were complete.                           There was a medium-sized lipoma, in the cecum.  A 3 mm polyp was found in the hepatic flexure. The                            polyp was sessile. The polyp was removed with a                            cold snare. Resection and retrieval were complete.                           Two sessile polyps were found in the transverse                            colon. The polyps were 3 to 6 mm in size. These                            polyps were removed with a cold snare. Resection                            and retrieval were complete.                           A 5 mm polyp was found in the sigmoid colon. The                            polyp was sessile. The polyp was removed with a                            cold snare. Resection and retrieval were complete.                           A few medium-mouthed diverticula were found in the                            sigmoid colon.                           Internal hemorrhoids  were found during                            retroflexion. The hemorrhoids were small.                           The exam was otherwise without abnormality. Complications:            No immediate complications. Estimated blood loss:                            Minimal. Estimated Blood Loss:     Estimated blood loss was minimal. Impression:               - One 3 mm polyp in the cecum, removed with a cold                            snare. Resected and  retrieved.                           - Medium-sized lipoma in the cecum.                           - One 3 mm polyp at the hepatic flexure, removed                            with a cold snare. Resected and retrieved.                           - Two 3 to 6 mm polyps in the transverse colon,                            removed with a cold snare. Resected and retrieved.                           - One 5 mm polyp in the sigmoid colon, removed with                            a cold snare. Resected and retrieved.                           - Diverticulosis in the sigmoid colon.                           - Internal hemorrhoids.                           - The examination was otherwise normal. Recommendation:           - Patient has a contact number available for                            emergencies. The signs and symptoms of potential                            delayed complications were discussed with the                            patient. Return to normal activities tomorrow.                            Written discharge instructions were provided to the                            patient.                           - Resume previous diet.                           - Continue present medications.                           -  Await pathology results. Remo Lipps P. Janaisha Tolsma, MD 10/06/2020 10:01:08 AM This report has been signed electronically.

## 2020-10-10 ENCOUNTER — Telehealth: Payer: Self-pay

## 2020-10-10 NOTE — Telephone Encounter (Signed)
  Follow up Call-  Call back number 10/06/2020  Post procedure Call Back phone  # [phone]  Permission to leave phone message Yes  Some recent data might be hidden     Patient questions:  Do you have a fever, pain , or abdominal swelling? No. Pain Score  0 *  Have you tolerated food without any problems? Yes.    Have you been able to return to your normal activities? Yes.    Do you have any questions about your discharge instructions: Diet   No. Medications  No. Follow up visit  No.  Do you have questions or concerns about your Care? No.  Actions: * If pain score is 4 or above: No action needed, pain <4.  1. Have you developed a fever since your procedure? no  2.   Have you had an respiratory symptoms (SOB or cough) since your procedure? no  3.   Have you tested positive for COVID 19 since your procedure no  4.   Have you had any family members/close contacts diagnosed with the COVID 19 since your procedure?  no   If yes to any of these questions please route to Joylene John, RN and Joella Prince, RN

## 2020-10-10 NOTE — Telephone Encounter (Signed)
Left message on follow up call. 

## 2020-10-17 ENCOUNTER — Other Ambulatory Visit: Payer: Self-pay | Admitting: Internal Medicine

## 2020-10-17 DIAGNOSIS — D225 Melanocytic nevi of trunk: Secondary | ICD-10-CM | POA: Diagnosis not present

## 2020-10-17 DIAGNOSIS — D2271 Melanocytic nevi of right lower limb, including hip: Secondary | ICD-10-CM | POA: Diagnosis not present

## 2020-10-17 DIAGNOSIS — D224 Melanocytic nevi of scalp and neck: Secondary | ICD-10-CM | POA: Diagnosis not present

## 2020-10-17 DIAGNOSIS — L570 Actinic keratosis: Secondary | ICD-10-CM | POA: Diagnosis not present

## 2020-10-17 DIAGNOSIS — L821 Other seborrheic keratosis: Secondary | ICD-10-CM | POA: Diagnosis not present

## 2020-10-17 DIAGNOSIS — D485 Neoplasm of uncertain behavior of skin: Secondary | ICD-10-CM | POA: Diagnosis not present

## 2020-10-17 DIAGNOSIS — Z8711 Personal history of peptic ulcer disease: Secondary | ICD-10-CM

## 2020-10-17 DIAGNOSIS — L814 Other melanin hyperpigmentation: Secondary | ICD-10-CM | POA: Diagnosis not present

## 2020-10-17 DIAGNOSIS — Z85828 Personal history of other malignant neoplasm of skin: Secondary | ICD-10-CM | POA: Diagnosis not present

## 2020-10-17 DIAGNOSIS — D1801 Hemangioma of skin and subcutaneous tissue: Secondary | ICD-10-CM | POA: Diagnosis not present

## 2020-10-17 NOTE — Telephone Encounter (Signed)
Please refill as per office routine med refill policy (all routine meds refilled for 3 mo or monthly per pt preference up to one year from last visit, then month to month grace period for 3 mo, then further med refills will have to be denied)  

## 2020-10-20 DIAGNOSIS — M0589 Other rheumatoid arthritis with rheumatoid factor of multiple sites: Secondary | ICD-10-CM | POA: Diagnosis not present

## 2020-11-03 ENCOUNTER — Other Ambulatory Visit: Payer: Self-pay | Admitting: Internal Medicine

## 2020-11-03 DIAGNOSIS — I10 Essential (primary) hypertension: Secondary | ICD-10-CM

## 2020-11-03 DIAGNOSIS — I252 Old myocardial infarction: Secondary | ICD-10-CM

## 2020-11-03 MED ORDER — SILDENAFIL CITRATE 100 MG PO TABS: ORAL_TABLET | ORAL | Status: DC

## 2020-11-03 MED ORDER — SILDENAFIL CITRATE 100 MG PO TABS: ORAL_TABLET | ORAL | 5 refills | Status: DC

## 2021-01-24 DIAGNOSIS — E669 Obesity, unspecified: Secondary | ICD-10-CM | POA: Diagnosis not present

## 2021-01-24 DIAGNOSIS — M5030 Other cervical disc degeneration, unspecified cervical region: Secondary | ICD-10-CM | POA: Diagnosis not present

## 2021-01-24 DIAGNOSIS — M7021 Olecranon bursitis, right elbow: Secondary | ICD-10-CM | POA: Diagnosis not present

## 2021-01-24 DIAGNOSIS — Z6830 Body mass index (BMI) 30.0-30.9, adult: Secondary | ICD-10-CM | POA: Diagnosis not present

## 2021-01-24 DIAGNOSIS — M0589 Other rheumatoid arthritis with rheumatoid factor of multiple sites: Secondary | ICD-10-CM | POA: Diagnosis not present

## 2021-01-24 DIAGNOSIS — M797 Fibromyalgia: Secondary | ICD-10-CM | POA: Diagnosis not present

## 2021-01-24 DIAGNOSIS — M150 Primary generalized (osteo)arthritis: Secondary | ICD-10-CM | POA: Diagnosis not present

## 2021-02-03 ENCOUNTER — Other Ambulatory Visit: Payer: Self-pay | Admitting: Internal Medicine

## 2021-02-13 ENCOUNTER — Encounter: Payer: Self-pay | Admitting: Internal Medicine

## 2021-02-13 ENCOUNTER — Other Ambulatory Visit: Payer: Self-pay

## 2021-02-13 ENCOUNTER — Ambulatory Visit (INDEPENDENT_AMBULATORY_CARE_PROVIDER_SITE_OTHER)
Admission: RE | Admit: 2021-02-13 | Discharge: 2021-02-13 | Disposition: A | Discharge status: Home / Self Care | Payer: Medicare HMO | Source: Ambulatory Visit | Attending: Internal Medicine | Admitting: Internal Medicine

## 2021-02-13 ENCOUNTER — Ambulatory Visit (INDEPENDENT_AMBULATORY_CARE_PROVIDER_SITE_OTHER): Payer: Medicare HMO | Admitting: Internal Medicine

## 2021-02-13 VITALS — BP 122/82 | HR 67 | Temp 98.2°F | Ht 72.0 in | Wt 213.8 lb

## 2021-02-13 DIAGNOSIS — M069 Rheumatoid arthritis, unspecified: Secondary | ICD-10-CM | POA: Diagnosis not present

## 2021-02-13 DIAGNOSIS — G4489 Other headache syndrome: Secondary | ICD-10-CM

## 2021-02-13 DIAGNOSIS — R519 Headache, unspecified: Secondary | ICD-10-CM | POA: Diagnosis not present

## 2021-02-13 DIAGNOSIS — R42 Dizziness and giddiness: Secondary | ICD-10-CM

## 2021-02-13 DIAGNOSIS — M542 Cervicalgia: Secondary | ICD-10-CM | POA: Diagnosis not present

## 2021-02-13 LAB — COMPREHENSIVE METABOLIC PANEL
ALT: 56 U/L — ABNORMAL HIGH (ref 0–53)
AST: 40 U/L — ABNORMAL HIGH (ref 0–37)
Albumin: 4.2 g/dL (ref 3.5–5.2)
Alkaline Phosphatase: 59 U/L (ref 39–117)
BUN: 17 mg/dL (ref 6–23)
CO2: 24 mEq/L (ref 19–32)
Calcium: 9.4 mg/dL (ref 8.4–10.5)
Chloride: 103 mEq/L (ref 96–112)
Creatinine, Ser: 1.38 mg/dL (ref 0.40–1.50)
GFR: 52.75 mL/min — ABNORMAL LOW (ref >60.00)
Glucose, Bld: 135 mg/dL — ABNORMAL HIGH (ref 70–99)
Potassium: 4.1 mEq/L (ref 3.5–5.1)
Sodium: 138 mEq/L (ref 135–145)
Total Bilirubin: 0.6 mg/dL (ref 0.2–1.2)
Total Protein: 7 g/dL (ref 6.0–8.3)

## 2021-02-13 LAB — CBC WITH DIFFERENTIAL/PLATELET
Basophils Absolute: 0.1 10*3/uL (ref 0.0–0.1)
Basophils Relative: 1.4 % (ref 0.0–3.0)
Eosinophils Absolute: 0.3 10*3/uL (ref 0.0–0.7)
Eosinophils Relative: 3.9 % (ref 0.0–5.0)
HCT: 40.6 % (ref 39.0–52.0)
Hemoglobin: 13.4 g/dL (ref 13.0–17.0)
Lymphocytes Relative: 28.3 % (ref 12.0–46.0)
Lymphs Abs: 2.2 10*3/uL (ref 0.7–4.0)
MCHC: 33 g/dL (ref 30.0–36.0)
MCV: 87.3 fl (ref 78.0–100.0)
Monocytes Absolute: 0.4 10*3/uL (ref 0.1–1.0)
Monocytes Relative: 5.9 % (ref 3.0–12.0)
Neutro Abs: 4.6 10*3/uL (ref 1.4–7.7)
Neutrophils Relative %: 60.5 % (ref 43.0–77.0)
Platelets: 259 10*3/uL (ref 150.0–400.0)
RBC: 4.65 Mil/uL (ref 4.22–5.81)
RDW: 13.5 % (ref 11.5–15.5)
WBC: 7.6 10*3/uL (ref 4.0–10.5)

## 2021-02-13 LAB — SEDIMENTATION RATE: Sed Rate: 21 mm/hr — ABNORMAL HIGH (ref 0–20)

## 2021-02-13 LAB — TSH: TSH: 1.32 u[IU]/mL (ref 0.35–5.50)

## 2021-02-13 MED ORDER — MECLIZINE HCL 12.5 MG PO TABS: 12.5000 mg | ORAL_TABLET | Freq: Three times a day (TID) | ORAL | 1 refills | Status: AC | PRN

## 2021-02-13 MED ORDER — B COMPLEX PLUS PO TABS: 1.0000 | ORAL_TABLET | Freq: Every day | ORAL | 3 refills | Status: AC

## 2021-02-13 NOTE — Assessment & Plan Note (Signed)
CT C spine

## 2021-02-13 NOTE — Progress Notes (Signed)
Subjective:  Patient ID: [name] Michael, male    DOB: 28-Feb-1951  Age: 70 y.o. MRN: [id number]  CC: Dizziness (Pt states on Sat had a dizzy spell. He states he was layig down, and when he sat up head felt like a big wave) and Headache (Pt states he's been having constant headaches for months.. not sure if sxs are related)   HPI [name] Michael presents for a dizzy spell on Sat when he was laying on the R side He almost fell in the shower C/o HAs x 2 months Pt has RA  Outpatient Medications Prior to Visit  Medication Sig Dispense Refill   aspirin 81 MG EC tablet Take 1 tablet (81 mg total) by mouth daily. 30 tablet 11   Cholecalciferol (VITAMIN D) 50 MCG (2000 UT) CAPS Take by mouth.     clonazePAM (KLONOPIN) 1 MG tablet TAKE ONE TABLET BY MOUTH TWICE DAILY AS NEEDED FOR ANXIETY 60 tablet 3   folic acid (FOLVITE) 939 MCG tablet Take 400 mcg by mouth daily.     hydroxychloroquine (PLAQUENIL) 200 MG tablet Take 200 mg by mouth 2 (two) times daily.     leflunomide (ARAVA) 20 MG tablet Take 20 mg by mouth daily.       losartan (COZAAR) 25 MG tablet Take 1 tablet (25 mg total) by mouth daily. 90 tablet 3   metoprolol tartrate (LOPRESSOR) 25 MG tablet TAKE ONE TABLET BY MOUTH TWICE DAILY 180 tablet 2   Omega-3 Fatty Acids (FISH OIL) 1200 MG CAPS Take by mouth.     omeprazole (PRILOSEC) 20 MG capsule TAKE ONE CAPSULE BY MOUTH ONE TIME DAILY 90 capsule 0   sildenafil (VIAGRA) 100 MG tablet 1 tab by mouth once daily as needed 10 tablet 5   Turmeric 500 MG CAPS Take by mouth.     zolpidem (AMBIEN) 10 MG tablet TAKE ONE TABLET BY MOUTH DAILY AT BEDTIME 90 tablet 1   No facility-administered medications prior to visit.    ROS: Review of Systems  Constitutional:  Negative for appetite change, fatigue, fever and unexpected weight change.  HENT:  Negative for congestion, ear pain, hearing loss, nosebleeds, sneezing, sore throat and trouble swallowing.   Eyes:  Negative for itching and visual  disturbance.  Respiratory:  Negative for cough.   Cardiovascular:  Negative for chest pain, palpitations and leg swelling.  Gastrointestinal:  Negative for abdominal distention, blood in stool, diarrhea and nausea.  Genitourinary:  Negative for frequency and hematuria.  Musculoskeletal:  Negative for back pain, gait problem, joint swelling and neck pain.  Skin:  Negative for rash.  Neurological:  Positive for dizziness and headaches. Negative for tremors, speech difficulty and weakness.  Psychiatric/Behavioral:  Negative for agitation, dysphoric mood and sleep disturbance. The patient is not nervous/anxious.    Objective:  BP 122/82 (BP Location: Left Arm)   Pulse 67   Temp 98.2 F (36.8 C) (Oral)   Ht 6' (1.829 m)   Wt 213 lb 12.8 oz (97 kg)   SpO2 96%   BMI 29.00 kg/m   BP Readings from Last 3 Encounters:  02/13/21 122/82  10/06/20 94/66  07/11/20 140/90    Wt Readings from Last 3 Encounters:  02/13/21 213 lb 12.8 oz (97 kg)  10/06/20 214 lb (97.1 kg)  09/21/20 214 lb (97.1 kg)    Physical Exam Constitutional:      General: He is not in acute distress.    Appearance: He is well-developed.  Comments: NAD  Eyes:     Conjunctiva/sclera: Conjunctivae normal.     Pupils: Pupils are equal, round, and reactive to light.  Neck:     Thyroid: No thyromegaly.     Vascular: No JVD.  Cardiovascular:     Rate and Rhythm: Normal rate and regular rhythm.     Heart sounds: Normal heart sounds. No murmur heard.   No friction rub. No gallop.  Pulmonary:     Effort: Pulmonary effort is normal. No respiratory distress.     Breath sounds: Normal breath sounds. No wheezing or rales.  Chest:     Chest wall: No tenderness.  Abdominal:     General: Bowel sounds are normal. There is no distension.     Palpations: Abdomen is soft. There is no mass.     Tenderness: There is no abdominal tenderness. There is no guarding or rebound.  Musculoskeletal:        General: No tenderness.  Normal range of motion.     Cervical back: Normal range of motion.  Lymphadenopathy:     Cervical: No cervical adenopathy.  Skin:    General: Skin is warm and dry.     Findings: No rash.  Neurological:     Mental Status: He is alert and oriented to person, place, and time.     Cranial Nerves: No cranial nerve deficit.     Motor: No abnormal muscle tone.     Coordination: Coordination normal.     Gait: Gait normal.     Deep Tendon Reflexes: Reflexes are normal and symmetric.  Psychiatric:        Behavior: Behavior normal.        Thought Content: Thought content normal.        Judgment: Judgment normal.  Romberg (-)  Gait - steady, antalgic due to R foot RA arthritis RA deformities - hands, feet H-P (+/-) on the R Neck stiff  Lab Results  Component Value Date   WBC 13.0 (H) 07/11/2020   HGB 13.7 07/11/2020   HCT 41.7 07/11/2020   PLT 280.0 07/11/2020   GLUCOSE 88 07/11/2020   CHOL 201 (H) 07/11/2020   TRIG 131.0 07/11/2020   HDL 36.00 (L) 07/11/2020   LDLDIRECT 131.0 04/03/2017   LDLCALC 139 (H) 07/11/2020   ALT 55 (H) 07/11/2020   AST 38 (H) 07/11/2020   NA 138 07/11/2020   K 4.5 07/11/2020   CL 102 07/11/2020   CREATININE 1.40 07/11/2020   BUN 18 07/11/2020   CO2 27 07/11/2020   TSH 1.09 07/11/2020   PSA 1.70 07/11/2020   INR 1.00 08/21/2017   HGBA1C 5.5 07/11/2020    CT ANGIO CHEST AORTA W &/OR WO CONTRAST  Result Date: 07/24/2018 CLINICAL DATA:  70 year old male with a thoracic aortic aneurysm EXAM: CT ANGIOGRAPHY CHEST WITH CONTRAST TECHNIQUE: Multidetector CT imaging of the chest was performed using the standard protocol during bolus administration of intravenous contrast. Multiplanar CT image reconstructions and MIPs were obtained to evaluate the vascular anatomy. CONTRAST:  148mL ISOVUE-370 IOPAMIDOL (ISOVUE-370) INJECTION 76% COMPARISON:  Prior chest CT 08/20/2017 FINDINGS: Cardiovascular: Conventional 3 vessel arch anatomy. Fusiform aneurysmal dilatation  of the ascending thoracic aorta with a maximal transverse diameter of 4.6 cm, unchanged. No effacement of the sino-tubular junction. Calcifications again noted in the coronary arteries. The pulmonary artery is normal in size. The heart is normal in size. No pericardial effusion. Unremarkable pulmonary venous drainage pattern. Mediastinum/Nodes: Unremarkable CT appearance of the thyroid gland. No suspicious  mediastinal or hilar adenopathy. No soft tissue mediastinal mass. The thoracic esophagus is unremarkable. Lungs/Pleura: Lungs are clear. No pleural effusion or pneumothorax. Upper Abdomen: No acute abnormality. Musculoskeletal: No acute fracture or aggressive appearing lytic or blastic osseous lesion. Review of the MIP images confirms the above findings. IMPRESSION: Stable fusiform aneurysmal dilatation of the ascending thoracic aorta with a maximal transverse diameter of 4.6 cm. Ascending thoracic aortic aneurysm. Recommend semi-annual imaging followup by CTA or MRA and referral to cardiothoracic surgery if not already obtained. This recommendation follows 2010 ACCF/AHA/AATS/ACR/ASA/SCA/SCAI/SIR/STS/SVM Guidelines for the Diagnosis and Management of Patients With Thoracic Aortic Disease. Circulation. 2010; 121: T927-G394. Aortic aneurysm NOS (ICD10-I71.9). Signed, Criselda Peaches, MD, [facility] Electronically Signed   By: Jacqulynn Cadet M.D.   On: 07/24/2018 13:54    Assessment & Plan:     Walker Kehr, MD

## 2021-02-13 NOTE — Addendum Note (Signed)
Addended by: Jacobo Forest on: 02/13/2021 11:38 AM   Modules accepted: Orders

## 2021-02-13 NOTE — Patient Instructions (Signed)
Benign Positional Vertigo symptoms on the R - likely. Start Meclizine. Start Laruth Bouchard - Daroff exercise several times a day as dirrected Head CTVertigo Vertigo is the feeling that you or the things around you are moving when they are not. This feeling can come and go at any time. Vertigo often goes away on its own. This condition can be dangerous if it happens when you are doingactivities like driving or working with machines. Your doctor will do tests to find the cause of your vertigo. These tests willalso help your doctor decide on the best treatment for you. Follow these instructions at home: Eating and drinking     Drink enough fluid to keep your pee (urine) pale yellow. Do not drink alcohol. Activity Return to your normal activities when your doctor says that it is safe. In the morning, first sit up on the side of the bed. When you feel okay, stand slowly while you hold onto something until you know that your balance is fine. Move slowly. Avoid sudden body or head movements or certain positions, as told by your doctor. Use a cane if you have trouble standing or walking. Sit down right away if you feel dizzy. Avoid doing any tasks or activities that can cause danger to you or others if you get dizzy. Avoid bending down if you feel dizzy. Place items in your home so that they are easy for you to reach without bending or leaning over. Do not drive or use machinery if you feel dizzy. General instructions Take over-the-counter and prescription medicines only as told by your doctor. Keep all follow-up visits. Contact a doctor if: Your medicine does not help your vertigo. Your problems get worse or you have new symptoms. You have a fever. You feel like you may vomit (nauseous), or this feeling gets worse. You start to vomit. Your family or friends see changes in how you act. You lose feeling (have numbness) in part of your body. You feel prickling and tingling in a part of your  body. Get help right away if: You are always dizzy. You faint. You get very bad headaches. You get a stiff neck. Bright light starts to bother you. You have trouble moving or talking. You feel weak in your hands, arms, or legs. You have changes in your hearing or in how you see (vision). These symptoms may be an emergency. Get help right away. Call your local emergency services (911 in the [place] Do not wait to see if the symptoms will go away. Do not drive yourself to the hospital. Summary Vertigo is the feeling that you or the things around you are moving when they are not. Your doctor will do tests to find the cause of your vertigo. You may be told to avoid some tasks, positions, or movements. Contact a doctor if your medicine is not helping, or if you have a fever, new symptoms, or a change in how you act. Get help right away if you get very bad headaches, or if you have changes in how you speak, hear, or see. This information is not intended to replace advice given to you by your health care provider. Make sure you discuss any questions you have with your healthcare provider. Document Revised: 06/21/2020 Document Reviewed: 06/21/2020 Elsevier Patient Education  2022 [employer].

## 2021-02-13 NOTE — Assessment & Plan Note (Signed)
Head CT Ibuprofen 600 mg po tid prn pc

## 2021-02-13 NOTE — Assessment & Plan Note (Signed)
Benign Positional Vertigo symptoms on the R - likely. Start Meclizine. Start Laruth Bouchard - Daroff exercise several times a day as dirrected Head CT

## 2021-02-20 ENCOUNTER — Other Ambulatory Visit: Payer: Self-pay

## 2021-02-20 ENCOUNTER — Ambulatory Visit (INDEPENDENT_AMBULATORY_CARE_PROVIDER_SITE_OTHER): Payer: Medicare HMO | Admitting: Internal Medicine

## 2021-02-20 VITALS — BP 118/82 | HR 77 | Temp 98.5°F | Ht 72.0 in | Wt 215.0 lb

## 2021-02-20 DIAGNOSIS — R739 Hyperglycemia, unspecified: Secondary | ICD-10-CM

## 2021-02-20 DIAGNOSIS — R42 Dizziness and giddiness: Secondary | ICD-10-CM | POA: Diagnosis not present

## 2021-02-20 DIAGNOSIS — N1831 Chronic kidney disease, stage 3a: Secondary | ICD-10-CM | POA: Diagnosis not present

## 2021-02-20 DIAGNOSIS — G4489 Other headache syndrome: Secondary | ICD-10-CM | POA: Diagnosis not present

## 2021-02-20 DIAGNOSIS — I10 Essential (primary) hypertension: Secondary | ICD-10-CM

## 2021-02-20 NOTE — Patient Instructions (Addendum)
Ok to stay off the losartan for symptoms of lower BP  Ok to continue the B1 complex multivitamin daily  Please continue all other medications as before, and refills have been done if requested.  Please have the pharmacy call with any other refills you may need.  Please continue your efforts at being more active, low cholesterol diet, and weight control.  Please keep your appointments with your specialists as you may have planned

## 2021-02-20 NOTE — Progress Notes (Signed)
Patient ID: [name] Michael, male   DOB: 10-20-1950, 70 y.o.   MRN: [id number]        Chief Complaint: follow up HTN, and hyperglycemia , ckd, and vertigo, HA       HPI:  [name] Michael is a 70 y.o. male here with c/o HA and vertigo resolved with b complex mvi.  Pt denies chest pain, increased sob or doe, wheezing, orthopnea, PND, increased LE swelling, palpitations, dizziness or syncope.   Pt denies polydipsia, polyuria, or new focal neuro s/s.   Pt denies fever, wt loss, night sweats, loss of appetite, or other constitutional symptoms  .          Wt Readings from Last 3 Encounters:  02/20/21 215 lb (97.5 kg)  02/13/21 213 lb 12.8 oz (97 kg)  10/06/20 214 lb (97.1 kg)   BP Readings from Last 3 Encounters:  02/20/21 118/82  02/13/21 122/82  10/06/20 94/66         Past Medical History:  Diagnosis Date   ALLERGIC RHINITIS 04/30/2007   Allergy    mild   ANEMIA-NOS 07/12/2007   ANXIETY 02/17/2009   Blood transfusion without reported diagnosis    bleeding ulcers in 2008 or 2009   Cancer ([place]) 10/2019   lip cancer removed    Cataract    bilat removed    Clotting disorder ([place])    PE 2009- pt unaware of this finding    Coronary artery disease involving native coronary artery of native heart with unstable angina pectoris ([place]) 05/10/2011   DEPRESSIVE DISORDER 02/14/2009   Fibromyalgia    GERD (gastroesophageal reflux disease)    History of pulmonary embolism 03/09/2008   HYPERLIPIDEMIA 04/27/2007   HYPERTENSION 04/27/2007   INSOMNIA UNSPECIFIED 02/20/2010   Lupus ([place])    "w/RA" (08/20/2017)   Lupus ([place])    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   Migraine    "stopped in the early 2000s" (08/20/2017)   Non-ST elevation (NSTEMI) myocardial infarction ([place]) -TYPE 4A peri-PCI 08/21/2017   Nonspecific abnormal toxicological findings 03/14/2008   NSTEMI (non-ST elevated myocardial infarction) ([place]) 08/20/2017   OBESITY 04/30/2007   OSTEOARTHRITIS 04/30/2007   PEPTIC ULCER DISEASE 07/12/2007    Presence of drug coated stent in left circumflex coronary artery 08/21/2017   PROSTATITIS, HX OF 04/30/2007   RESTRICTIVE LUNG DISEASE 06/20/2009   Rheumatoid arthritis ([place])    THORACIC AORTIC ANEURYSM 07/06/2009   Chest CT 11/12: ascending thoracic aortic aneurysm stable at 42 mm.    UNSPECIFIED MYALGIA AND MYOSITIS 02/14/2009   Past Surgical History:  Procedure Laterality Date   APPENDECTOMY     BACK SURGERY  2017   cervical discectomy C3-C4    CARDIAC CATHETERIZATION  06/2008   Non-obstructive CAD.  EF 40-45%. Anterior - apical HK   COLONOSCOPY  2010   CORONARY ANGIOPLASTY WITH STENT PLACEMENT  08/20/2017   CORONARY STENT INTERVENTION N/A 08/20/2017   Procedure: CORONARY STENT INTERVENTION;  Surgeon: [name], MD;  Location: [facility];  Service: Cardiovascular;  Laterality: N/A;   LEFT HEART CATH AND CORONARY ANGIOGRAPHY N/A 08/20/2017   Procedure: LEFT HEART CATH AND CORONARY ANGIOGRAPHY;  Surgeon: [name], MD;  Location: [facility];  Service: Cardiovascular;  Laterality: N/A;   NM MYOVIEW LTD  06/2009   Abnormal stress nuclear study with small prior lateral infarct; no ischemia. Normal EF   POLYPECTOMY  04/14/2009   TA x 1   SURGERY OF LIP  10/2019   TRANSTHORACIC ECHOCARDIOGRAM  06/2009  55% to 60%. Wall motion was normal.  Mod Dilated [place].  Mild LA dilation.   UPPER GASTROINTESTINAL ENDOSCOPY      reports that he has never smoked. He has never used smokeless tobacco. He reports that he does not drink alcohol and does not use drugs. family history includes Alcohol abuse in an other family member; Diabetes in his brother, brother, father, mother, and another family member; Heart disease in his brother, father, mother, and another family member; Hyperlipidemia in his father and mother; Hypertension in his brother, sister, and another family member; Stroke in his father, mother, and another family member. Allergies  Allergen Reactions    Atorvastatin     REACTION: myalgias   Ciprofloxacin Other (See Comments)    myalgias   Crestor [Rosuvastatin]     myalgias   Doxycycline    Hydroxychloroquine Sulfate     REACTION: GI upset and H, pt is currently taking and still has some GI problems but is tolerating it   Current Outpatient Medications on File Prior to Visit  Medication Sig Dispense Refill   aspirin 81 MG EC tablet Take 1 tablet (81 mg total) by mouth daily. 30 tablet 11   B Complex-Folic Acid (B COMPLEX PLUS) TABS Take 1 tablet by mouth daily. 100 tablet 3   Cholecalciferol (VITAMIN D) 50 MCG (2000 UT) CAPS Take by mouth.     clonazePAM (KLONOPIN) 1 MG tablet TAKE ONE TABLET BY MOUTH TWICE DAILY AS NEEDED FOR ANXIETY 60 tablet 3   folic acid (FOLVITE) 503 MCG tablet Take 400 mcg by mouth daily.     hydroxychloroquine (PLAQUENIL) 200 MG tablet Take 200 mg by mouth 2 (two) times daily.     leflunomide (ARAVA) 20 MG tablet Take 20 mg by mouth daily.       meclizine (ANTIVERT) 12.5 MG tablet Take 1 tablet (12.5 mg total) by mouth 3 (three) times daily as needed for dizziness. 60 tablet 1   metoprolol tartrate (LOPRESSOR) 25 MG tablet TAKE ONE TABLET BY MOUTH TWICE DAILY 180 tablet 2   Omega-3 Fatty Acids (FISH OIL) 1200 MG CAPS Take by mouth.     omeprazole (PRILOSEC) 20 MG capsule TAKE ONE CAPSULE BY MOUTH ONE TIME DAILY 90 capsule 0   sildenafil (VIAGRA) 100 MG tablet 1 tab by mouth once daily as needed 10 tablet 5   Turmeric 500 MG CAPS Take by mouth.     zolpidem (AMBIEN) 10 MG tablet TAKE ONE TABLET BY MOUTH DAILY AT BEDTIME 90 tablet 1   No current facility-administered medications on file prior to visit.        ROS:  All others reviewed and negative.  Objective        PE:  BP 118/82 (BP Location: Left Arm, Patient Position: Sitting, Cuff Size: Normal)   Pulse 77   Temp 98.5 F (36.9 C) (Oral)   Ht 6' (1.829 m)   Wt 215 lb (97.5 kg)   SpO2 99%   BMI 29.16 kg/m                 Constitutional: Pt appears  in NAD               HENT: Head: NCAT.                Right Ear: External ear normal.                 Left Ear: External ear normal.  Eyes: . Pupils are equal, round, and reactive to light. Conjunctivae and EOM are normal               Nose: without d/c or deformity               Neck: Neck supple. Gross normal ROM               Cardiovascular: Normal rate and regular rhythm.                 Pulmonary/Chest: Effort normal and breath sounds without rales or wheezing.                Abd:  Soft, NT, ND, + BS, no organomegaly               Neurological: Pt is alert. At baseline orientation, motor grossly intact               Skin: Skin is warm. No rashes, no other new lesions, LE edema - none               Psychiatric: Pt behavior is normal without agitation   Micro: none  Cardiac tracings I have personally interpreted today:  none  Pertinent Radiological findings (summarize): none   Lab Results  Component Value Date   WBC 7.6 02/13/2021   HGB 13.4 02/13/2021   HCT 40.6 02/13/2021   PLT 259.0 02/13/2021   GLUCOSE 135 (H) 02/13/2021   CHOL 201 (H) 07/11/2020   TRIG 131.0 07/11/2020   HDL 36.00 (L) 07/11/2020   LDLDIRECT 131.0 04/03/2017   LDLCALC 139 (H) 07/11/2020   ALT 56 (H) 02/13/2021   AST 40 (H) 02/13/2021   NA 138 02/13/2021   K 4.1 02/13/2021   CL 103 02/13/2021   CREATININE 1.38 02/13/2021   BUN 17 02/13/2021   CO2 24 02/13/2021   TSH 1.32 02/13/2021   PSA 1.70 07/11/2020   INR 1.00 08/21/2017   HGBA1C 5.5 07/11/2020   Assessment/Plan:  [name] Michael is a 70 y.o. White or Caucasian [1] male with  has a past medical history of ALLERGIC RHINITIS (04/30/2007), Allergy, ANEMIA-NOS (07/12/2007), ANXIETY (02/17/2009), Blood transfusion without reported diagnosis, Cancer ([place]) (10/2019), Cataract, Clotting disorder ([place]), Coronary artery disease involving native coronary artery of native heart with unstable angina pectoris ([place]) (05/10/2011), DEPRESSIVE DISORDER  (02/14/2009), Fibromyalgia, GERD (gastroesophageal reflux disease), History of pulmonary embolism (03/09/2008), HYPERLIPIDEMIA (04/27/2007), HYPERTENSION (04/27/2007), INSOMNIA UNSPECIFIED (02/20/2010), Lupus ([place]), Lupus ([place]), MALLORY-WEISS SYNDROME (04/27/2007), Migraine, Non-ST elevation (NSTEMI) myocardial infarction ([place]) -TYPE 4A peri-PCI (08/21/2017), Nonspecific abnormal toxicological findings (03/14/2008), NSTEMI (non-ST elevated myocardial infarction) ([place]) (08/20/2017), OBESITY (04/30/2007), OSTEOARTHRITIS (04/30/2007), PEPTIC ULCER DISEASE (07/12/2007), Presence of drug coated stent in left circumflex coronary artery (08/21/2017), PROSTATITIS, HX OF (04/30/2007), RESTRICTIVE LUNG DISEASE (06/20/2009), Rheumatoid arthritis ([place]), THORACIC AORTIC ANEURYSM (07/06/2009), and UNSPECIFIED MYALGIA AND MYOSITIS (02/14/2009).  Vertigo resovled with b complex mvi per pt,  to f/u any worsening symptoms or concerns   Headache Resolved with b complex mvi per pt,  to f/u any worsening symptoms or concerns  CKD (chronic kidney disease) stage 3, GFR 30-59 ml/min (HCC) Lab Results  Component Value Date   CREATININE 1.38 02/13/2021   Stable overall, cont to avoid nephrotoxins   Essential hypertension BP Readings from Last 3 Encounters:  02/20/21 118/82  02/13/21 122/82  10/06/20 94/66   Stable, pt to continue medical treatment  - now off the losartan 25   Hyperglycemia Lab Results  Component Value Date  HGBA1C 5.5 07/11/2020   Stable, pt to continue current medical treatment  - diet  Followup: Return in about 5 months (around 07/12/2021).  Cathlean Cower, MD 02/22/2021 10:08 PM [facility]

## 2021-02-22 ENCOUNTER — Encounter: Payer: Self-pay | Admitting: Internal Medicine

## 2021-02-22 NOTE — Assessment & Plan Note (Signed)
resovled with b complex mvi per pt,  to f/u any worsening symptoms or concerns

## 2021-02-22 NOTE — Assessment & Plan Note (Signed)
Lab Results  Component Value Date   HGBA1C 5.5 07/11/2020   Stable, pt to continue current medical treatment  - diet

## 2021-02-22 NOTE — Assessment & Plan Note (Signed)
BP Readings from Last 3 Encounters:  02/20/21 118/82  02/13/21 122/82  10/06/20 94/66   Stable, pt to continue medical treatment  - now off the losartan 25

## 2021-02-22 NOTE — Assessment & Plan Note (Signed)
Resolved with b complex mvi per pt,  to f/u any worsening symptoms or concerns

## 2021-02-22 NOTE — Assessment & Plan Note (Signed)
Lab Results  Component Value Date   CREATININE 1.38 02/13/2021   Stable overall, cont to avoid nephrotoxins

## 2021-03-20 ENCOUNTER — Other Ambulatory Visit: Payer: Self-pay | Admitting: Internal Medicine

## 2021-03-20 DIAGNOSIS — Z8711 Personal history of peptic ulcer disease: Secondary | ICD-10-CM

## 2021-03-20 NOTE — Telephone Encounter (Signed)
Please refill as per office routine med refill policy (all routine meds refilled for 3 mo or monthly per pt preference up to one year from last visit, then month to month grace period for 3 mo, then further med refills will have to be denied)  

## 2021-04-26 DIAGNOSIS — M0589 Other rheumatoid arthritis with rheumatoid factor of multiple sites: Secondary | ICD-10-CM | POA: Diagnosis not present

## 2021-05-05 ENCOUNTER — Other Ambulatory Visit: Payer: Self-pay | Admitting: Internal Medicine

## 2021-06-12 ENCOUNTER — Other Ambulatory Visit: Payer: Self-pay | Admitting: Internal Medicine

## 2021-06-12 DIAGNOSIS — Z8711 Personal history of peptic ulcer disease: Secondary | ICD-10-CM

## 2021-06-12 NOTE — Telephone Encounter (Signed)
Please refill as per office routine med refill policy (all routine meds to be refilled for 3 mo or monthly (per pt preference) up to one year from last visit, then month to month grace period for 3 mo, then further med refills will have to be denied) ? ?

## 2021-07-12 ENCOUNTER — Encounter: Payer: Self-pay | Admitting: Internal Medicine

## 2021-07-12 ENCOUNTER — Other Ambulatory Visit: Payer: Self-pay

## 2021-07-12 ENCOUNTER — Ambulatory Visit (INDEPENDENT_AMBULATORY_CARE_PROVIDER_SITE_OTHER): Payer: Medicare HMO | Admitting: Internal Medicine

## 2021-07-12 ENCOUNTER — Telehealth: Payer: Self-pay

## 2021-07-12 VITALS — BP 126/78 | HR 79 | Temp 98.2°F | Ht 72.0 in | Wt 210.0 lb

## 2021-07-12 DIAGNOSIS — E538 Deficiency of other specified B group vitamins: Secondary | ICD-10-CM

## 2021-07-12 DIAGNOSIS — Z0001 Encounter for general adult medical examination with abnormal findings: Secondary | ICD-10-CM | POA: Diagnosis not present

## 2021-07-12 DIAGNOSIS — T466X5A Adverse effect of antihyperlipidemic and antiarteriosclerotic drugs, initial encounter: Secondary | ICD-10-CM | POA: Diagnosis not present

## 2021-07-12 DIAGNOSIS — K760 Fatty (change of) liver, not elsewhere classified: Secondary | ICD-10-CM

## 2021-07-12 DIAGNOSIS — D126 Benign neoplasm of colon, unspecified: Secondary | ICD-10-CM | POA: Insufficient documentation

## 2021-07-12 DIAGNOSIS — G720 Drug-induced myopathy: Secondary | ICD-10-CM | POA: Diagnosis not present

## 2021-07-12 DIAGNOSIS — R739 Hyperglycemia, unspecified: Secondary | ICD-10-CM | POA: Diagnosis not present

## 2021-07-12 DIAGNOSIS — I10 Essential (primary) hypertension: Secondary | ICD-10-CM | POA: Diagnosis not present

## 2021-07-12 DIAGNOSIS — N1831 Chronic kidney disease, stage 3a: Secondary | ICD-10-CM | POA: Diagnosis not present

## 2021-07-12 DIAGNOSIS — E782 Mixed hyperlipidemia: Secondary | ICD-10-CM

## 2021-07-12 DIAGNOSIS — E559 Vitamin D deficiency, unspecified: Secondary | ICD-10-CM

## 2021-07-12 LAB — CBC WITH DIFFERENTIAL/PLATELET
Basophils Absolute: 0.1 10*3/uL (ref 0.0–0.1)
Basophils Relative: 1.5 % (ref 0.0–3.0)
Eosinophils Absolute: 0.3 10*3/uL (ref 0.0–0.7)
Eosinophils Relative: 4.4 % (ref 0.0–5.0)
HCT: 40.6 % (ref 39.0–52.0)
Hemoglobin: 13.4 g/dL (ref 13.0–17.0)
Lymphocytes Relative: 23.8 % (ref 12.0–46.0)
Lymphs Abs: 1.7 10*3/uL (ref 0.7–4.0)
MCHC: 33.1 g/dL (ref 30.0–36.0)
MCV: 88.2 fl (ref 78.0–100.0)
Monocytes Absolute: 0.8 10*3/uL (ref 0.1–1.0)
Monocytes Relative: 10.5 % (ref 3.0–12.0)
Neutro Abs: 4.4 10*3/uL (ref 1.4–7.7)
Neutrophils Relative %: 59.8 % (ref 43.0–77.0)
Platelets: 265 10*3/uL (ref 150.0–400.0)
RBC: 4.6 Mil/uL (ref 4.22–5.81)
RDW: 13.1 % (ref 11.5–15.5)
WBC: 7.3 10*3/uL (ref 4.0–10.5)

## 2021-07-12 LAB — URINALYSIS, ROUTINE W REFLEX MICROSCOPIC
Bilirubin Urine: NEGATIVE
Hgb urine dipstick: NEGATIVE
Ketones, ur: NEGATIVE
Leukocytes,Ua: NEGATIVE
Nitrite: NEGATIVE
RBC / HPF: NONE SEEN (ref 0–?)
Specific Gravity, Urine: 1.025 (ref 1.000–1.030)
Total Protein, Urine: NEGATIVE
Urine Glucose: NEGATIVE
Urobilinogen, UA: 0.2 (ref 0.0–1.0)
WBC, UA: NONE SEEN (ref 0–?)
pH: 6 (ref 5.0–8.0)

## 2021-07-12 LAB — HEPATIC FUNCTION PANEL
ALT: 74 U/L — ABNORMAL HIGH (ref 0–53)
AST: 51 U/L — ABNORMAL HIGH (ref 0–37)
Albumin: 4.1 g/dL (ref 3.5–5.2)
Alkaline Phosphatase: 62 U/L (ref 39–117)
Bilirubin, Direct: 0.1 mg/dL (ref 0.0–0.3)
Total Bilirubin: 0.5 mg/dL (ref 0.2–1.2)
Total Protein: 6.8 g/dL (ref 6.0–8.3)

## 2021-07-12 LAB — LIPID PANEL
Cholesterol: 207 mg/dL — ABNORMAL HIGH (ref 0–200)
HDL: 39.9 mg/dL (ref >39.00)
LDL Cholesterol: 143 mg/dL — ABNORMAL HIGH (ref 0–99)
NonHDL: 167.55
Total CHOL/HDL Ratio: 5
Triglycerides: 123 mg/dL (ref 0.0–149.0)
VLDL: 24.6 mg/dL (ref 0.0–40.0)

## 2021-07-12 LAB — BASIC METABOLIC PANEL
BUN: 15 mg/dL (ref 6–23)
CO2: 26 mEq/L (ref 19–32)
Calcium: 9.7 mg/dL (ref 8.4–10.5)
Chloride: 104 mEq/L (ref 96–112)
Creatinine, Ser: 1.27 mg/dL (ref 0.40–1.50)
GFR: 58.12 mL/min — ABNORMAL LOW (ref >60.00)
Glucose, Bld: 102 mg/dL — ABNORMAL HIGH (ref 70–99)
Potassium: 4.4 mEq/L (ref 3.5–5.1)
Sodium: 138 mEq/L (ref 135–145)

## 2021-07-12 LAB — TSH: TSH: 1.41 u[IU]/mL (ref 0.35–5.50)

## 2021-07-12 LAB — VITAMIN D 25 HYDROXY (VIT D DEFICIENCY, FRACTURES): VITD: >120 ng/mL

## 2021-07-12 LAB — PSA: PSA: 1.91 ng/mL (ref 0.10–4.00)

## 2021-07-12 LAB — HEMOGLOBIN A1C: Hgb A1c MFr Bld: 5.6 % (ref 4.6–6.5)

## 2021-07-12 LAB — VITAMIN B12: Vitamin B-12: 423 pg/mL (ref 211–911)

## 2021-07-12 MED ORDER — EZETIMIBE 10 MG PO TABS: 10.0000 mg | ORAL_TABLET | Freq: Every day | ORAL | 3 refills | Status: DC

## 2021-07-12 NOTE — Assessment & Plan Note (Signed)
Lab Results  Component Value Date   VITAMINB12 214 07/11/2020   Low, to start oral replacement - b12 1000 mcg qd

## 2021-07-12 NOTE — Progress Notes (Signed)
Patient ID: [name] Michael, male   DOB: 13-Jul-1951, 70 y.o.   MRN: [id number]         Chief Complaint:: wellness exam and low b12, hld, ckd, hyperglycemia       HPI:  [name] Michael is a 70 y.o. male here for wellness exam; declines covid booster and shingrix, o/w up to date                        Also needs to take b12.  Pt denies chest pain, increased sob or doe, wheezing, orthopnea, PND, increased LE swelling, palpitations, dizziness or syncope.   Pt denies polydipsia, polyuria, or new focal neuro s/s.  Has been statin intolerant in past, but would consider add zetia today, but not wanting lipid clinic for now to consider repatha.  Wt down several lbs with better diet.   Pt denies fever, wt loss, night sweats, loss of appetite, or other constitutional symptoms   No other new complaints  Conts to follow with Rheum and Cardiology Wt Readings from Last 3 Encounters:  07/12/21 210 lb (95.3 kg)  02/20/21 215 lb (97.5 kg)  02/13/21 213 lb 12.8 oz (97 kg)   BP Readings from Last 3 Encounters:  07/12/21 126/78  02/20/21 118/82  02/13/21 122/82   Immunization History  Administered Date(s) Administered   Fluad Quad(high Dose 65+) 04/18/2019, 07/11/2020   Influenza Split 05/18/2012, 04/05/2014   Influenza Whole 06/09/2007, 04/28/2013   Influenza, High Dose Seasonal PF 06/09/2021   Influenza,inj,Quad PF,6+ Mos 05/03/2017, 05/12/2018   Influenza-Unspecified 05/16/2016   Moderna Sars-Covid-2 Vaccination 09/17/2019, 10/15/2019, 04/17/2020, 12/20/2020   Pneumococcal Conjugate-13 05/26/2015   Pneumococcal Polysaccharide-23 04/07/2018   Td 02/17/2009   Tdap 04/22/2015   Zoster, Live 04/01/2013   There are no preventive care reminders to display for this patient.     Past Medical History:  Diagnosis Date   ALLERGIC RHINITIS 04/30/2007   Allergy    mild   ANEMIA-NOS 07/12/2007   ANXIETY 02/17/2009   Blood transfusion without reported diagnosis    bleeding ulcers in 2008 or 2009   Cancer  ([place]) 10/2019   lip cancer removed    Cataract    bilat removed    Clotting disorder ([place])    PE 2009- pt unaware of this finding    Coronary artery disease involving native coronary artery of native heart with unstable angina pectoris ([place]) 05/10/2011   DEPRESSIVE DISORDER 02/14/2009   Fibromyalgia    GERD (gastroesophageal reflux disease)    History of pulmonary embolism 03/09/2008   HYPERLIPIDEMIA 04/27/2007   HYPERTENSION 04/27/2007   INSOMNIA UNSPECIFIED 02/20/2010   Lupus ([place])    "w/RA" (08/20/2017)   Lupus ([place])    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) -TYPE 4A peri-PCI 08/21/2017   Nonspecific abnormal toxicological findings 03/14/2008   NSTEMI (non-ST elevated myocardial infarction) ([place]) 08/20/2017   OBESITY 04/30/2007   OSTEOARTHRITIS 04/30/2007   PEPTIC ULCER DISEASE 07/12/2007   Presence of drug coated stent in left circumflex coronary artery 08/21/2017   PROSTATITIS, HX OF 04/30/2007   RESTRICTIVE LUNG DISEASE 06/20/2009   Rheumatoid arthritis ([place])    THORACIC AORTIC ANEURYSM 07/06/2009   Chest CT 11/12: ascending thoracic aortic aneurysm stable at 42 mm.    UNSPECIFIED MYALGIA AND MYOSITIS 02/14/2009   Past Surgical History:  Procedure Laterality Date   APPENDECTOMY     BACK  SURGERY  2017   cervical discectomy C3-C4    CARDIAC CATHETERIZATION  06/2008   Non-obstructive CAD.  EF 40-45%. Anterior - apical HK   COLONOSCOPY  2010   CORONARY ANGIOPLASTY WITH STENT PLACEMENT  08/20/2017   CORONARY STENT INTERVENTION N/A 08/20/2017   Procedure: CORONARY STENT INTERVENTION;  Surgeon: [name], MD;  Location: [facility];  Service: Cardiovascular;  Laterality: N/A;   LEFT HEART CATH AND CORONARY ANGIOGRAPHY N/A 08/20/2017   Procedure: LEFT HEART CATH AND CORONARY ANGIOGRAPHY;  Surgeon: [name], MD;  Location: [facility];  Service: Cardiovascular;   Laterality: N/A;   NM MYOVIEW LTD  06/2009   Abnormal stress nuclear study with small prior lateral infarct; no ischemia. Normal EF   POLYPECTOMY  04/14/2009   TA x 1   SURGERY OF LIP  10/2019   TRANSTHORACIC ECHOCARDIOGRAM  06/2009   55% to 60%. Wall motion was normal.  Mod Dilated Aoi Root.  Mild LA dilation.   UPPER GASTROINTESTINAL ENDOSCOPY      reports that he has never smoked. He has never used smokeless tobacco. He reports that he does not drink alcohol and does not use drugs. family history includes Alcohol abuse in an other family member; Diabetes in his brother, brother, father, mother, and another family member; Heart disease in his brother, father, mother, and another family member; Hyperlipidemia in his father and mother; Hypertension in his brother, sister, and another family member; Stroke in his father, mother, and another family member. Allergies  Allergen Reactions   Atorvastatin     REACTION: myalgias   Ciprofloxacin Other (See Comments)    myalgias   Crestor [Rosuvastatin]     myalgias   Doxycycline    Hydroxychloroquine Sulfate     REACTION: GI upset and H, pt is currently taking and still has some GI problems but is tolerating it   Current Outpatient Medications on File Prior to Visit  Medication Sig Dispense Refill   aspirin 81 MG EC tablet Take 1 tablet (81 mg total) by mouth daily. 30 tablet 11   B Complex-Folic Acid (B COMPLEX PLUS) TABS Take 1 tablet by mouth daily. 100 tablet 3   Cholecalciferol (VITAMIN D) 50 MCG (2000 UT) CAPS Take by mouth.     clonazePAM (KLONOPIN) 1 MG tablet TAKE ONE TABLET BY MOUTH TWICE A DAY AS NEEDED FOR ANXIETY 60 tablet 3   folic acid (FOLVITE) 938 MCG tablet Take 400 mcg by mouth daily.     hydroxychloroquine (PLAQUENIL) 200 MG tablet Take 200 mg by mouth 2 (two) times daily.     leflunomide (ARAVA) 20 MG tablet Take 20 mg by mouth daily.       metoprolol tartrate (LOPRESSOR) 25 MG tablet TAKE ONE TABLET BY MOUTH TWICE DAILY  180 tablet 2   Omega-3 Fatty Acids (FISH OIL) 1200 MG CAPS Take by mouth.     omeprazole (PRILOSEC) 20 MG capsule TAKE ONE CAPSULE BY MOUTH ONE TIME DAILY 90 capsule 2   sildenafil (VIAGRA) 100 MG tablet 1 tab by mouth once daily as needed 10 tablet 5   Turmeric 500 MG CAPS Take by mouth.     zolpidem (AMBIEN) 10 MG tablet TAKE ONE TABLET BY MOUTH DAILY AT BEDTIME 90 tablet 1   meclizine (ANTIVERT) 12.5 MG tablet Take 1 tablet (12.5 mg total) by mouth 3 (three) times daily as needed for dizziness. (Patient not taking: Reported on 07/12/2021) 60 tablet 1   No current facility-administered  medications on file prior to visit.        ROS:  All others reviewed and negative.  Objective        PE:  BP 126/78 (BP Location: Left Arm, Patient Position: Sitting, Cuff Size: Large)   Pulse 79   Temp 98.2 F (36.8 C) (Oral)   Ht 6' (1.829 m)   Wt 210 lb (95.3 kg)   SpO2 95%   BMI 28.48 kg/m                 Constitutional: Pt appears in NAD               HENT: Head: NCAT.                Right Ear: External ear normal.                 Left Ear: External ear normal.                Eyes: . Pupils are equal, round, and reactive to light. Conjunctivae and EOM are normal               Nose: without d/c or deformity               Neck: Neck supple. Gross normal ROM               Cardiovascular: Normal rate and regular rhythm.                 Pulmonary/Chest: Effort normal and breath sounds without rales or wheezing.                Abd:  Soft, NT, ND, + BS, no organomegaly               Neurological: Pt is alert. At baseline orientation, motor grossly intact               Skin: Skin is warm. No rashes, no other new lesions, LE edema - none               Psychiatric: Pt behavior is normal without agitation   Micro: none  Cardiac tracings I have personally interpreted today:  none  Pertinent Radiological findings (summarize): none   Lab Results  Component Value Date   WBC 7.6 02/13/2021   HGB  13.4 02/13/2021   HCT 40.6 02/13/2021   PLT 259.0 02/13/2021   GLUCOSE 135 (H) 02/13/2021   CHOL 201 (H) 07/11/2020   TRIG 131.0 07/11/2020   HDL 36.00 (L) 07/11/2020   LDLDIRECT 131.0 04/03/2017   LDLCALC 139 (H) 07/11/2020   ALT 56 (H) 02/13/2021   AST 40 (H) 02/13/2021   NA 138 02/13/2021   K 4.1 02/13/2021   CL 103 02/13/2021   CREATININE 1.38 02/13/2021   BUN 17 02/13/2021   CO2 24 02/13/2021   TSH 1.32 02/13/2021   PSA 1.70 07/11/2020   INR 1.00 08/21/2017   HGBA1C 5.5 07/11/2020   Assessment/Plan:  [name] Michael is a 70 y.o. White or Caucasian [1] male with  has a past medical history of ALLERGIC RHINITIS (04/30/2007), Allergy, ANEMIA-NOS (07/12/2007), ANXIETY (02/17/2009), Blood transfusion without reported diagnosis, Cancer ([place]) (10/2019), Cataract, Clotting disorder ([place]), Coronary artery disease involving native coronary artery of native heart with unstable angina pectoris ([place]) (05/10/2011), DEPRESSIVE DISORDER (02/14/2009), Fibromyalgia, GERD (gastroesophageal reflux disease), History of pulmonary embolism (03/09/2008), HYPERLIPIDEMIA (04/27/2007), HYPERTENSION (04/27/2007), INSOMNIA UNSPECIFIED (02/20/2010), Lupus ([place]), Lupus ([place]), MALLORY-WEISS SYNDROME (04/27/2007), Migraine,  Non-ST elevation (NSTEMI) myocardial infarction [facility]) -TYPE 4A peri-PCI (08/21/2017), Nonspecific abnormal toxicological findings (03/14/2008), NSTEMI (non-ST elevated myocardial infarction) ([place]) (08/20/2017), OBESITY (04/30/2007), OSTEOARTHRITIS (04/30/2007), PEPTIC ULCER DISEASE (07/12/2007), Presence of drug coated stent in left circumflex coronary artery (08/21/2017), PROSTATITIS, HX OF (04/30/2007), RESTRICTIVE LUNG DISEASE (06/20/2009), Rheumatoid arthritis ([place]), THORACIC AORTIC ANEURYSM (07/06/2009), and UNSPECIFIED MYALGIA AND MYOSITIS (02/14/2009).  B12 deficiency Lab Results  Component Value Date   VITAMINB12 214 07/11/2020   Low, to start oral replacement - b12 1000 mcg qd   Encounter for well  adult exam with abnormal findings Age and sex appropriate education and counseling updated with regular exercise and diet Referrals for preventative services - none needed Immunizations addressed -declines covid booster, shingrix Smoking counseling  - none needed Evidence for depression or other mood disorder - none significant Most recent labs reviewed. I have personally reviewed and have noted: 1) the patient's medical and social history 2) The patient's current medications and supplements 3) The patient's height, weight, and BMI have been recorded in the chart   Statin myopathy Declines statin clinic for now, goal ldl < 70  Lab Results  Component Value Date   CHOL 201 (H) 07/11/2020   HDL 36.00 (L) 07/11/2020   LDLCALC 139 (H) 07/11/2020   LDLDIRECT 131.0 04/03/2017   TRIG 131.0 07/11/2020   CHOLHDL 6 07/11/2020     Hyperlipidemia Lab Results  Component Value Date   LDLCALC 139 (H) 07/11/2020   Uncontrolled, goal ldl < 70, to add zetia 10 qd, pt o.w declines lipid clinic for now to consider repatha  Hyperglycemia Lab Results  Component Value Date   HGBA1C 5.5 07/11/2020   Stable, pt to continue current medical treatment  - diet   Essential hypertension BP Readings from Last 3 Encounters:  07/12/21 126/78  02/20/21 118/82  02/13/21 122/82   Stable, pt to continue medical treatment lopressor   CKD (chronic kidney disease) stage 3, GFR 30-59 ml/min (HCC) Lab Results  Component Value Date   CREATININE 1.38 02/13/2021   Stable overall, cont to avoid nephrotoxins   Fatty liver With chronic mild elevated LFTs - for wt loss, low fat diet  Followup: Return in about 6 months (around 01/10/2022).  Cathlean Cower, MD 07/12/2021 9:07 AM [facility]

## 2021-07-12 NOTE — Assessment & Plan Note (Signed)
Lab Results  Component Value Date   CREATININE 1.38 02/13/2021   Stable overall, cont to avoid nephrotoxins

## 2021-07-12 NOTE — Assessment & Plan Note (Signed)
Lab Results  Component Value Date   HGBA1C 5.5 07/11/2020   Stable, pt to continue current medical treatment  - diet

## 2021-07-12 NOTE — Patient Instructions (Addendum)
Please check with insurance about the shingrix shingles shot, and if covered to go to the local pharmacy.  Please take all new medication as prescribed - the zetia 10 mg per day  Please call if you change your mind about a referral to the Lipid Clinic to try to get a cholesterol shot such as repatha  Please continue all other medications as before, and refills have been done if requested.  Please have the pharmacy call with any other refills you may need.  Please continue your efforts at being more active, low cholesterol diet, and weight control.  You are otherwise up to date with prevention measures today.  Please keep your appointments with your specialists as you may have planned  Please go to the LAB at the blood drawing area for the tests to be done  You will be contacted by phone if any changes need to be made immediately.  Otherwise, you will receive a letter about your results with an explanation, but please check with MyChart first.  Please remember to sign up for MyChart if you have not done so, as this will be important to you in the future with finding out test results, communicating by private email, and scheduling acute appointments online when needed.  Please make an Appointment to return in 6 months, or sooner if needed

## 2021-07-12 NOTE — Telephone Encounter (Signed)
CRITICAL VALUE STICKER  CRITICAL VALUE: Vitamin D >20  RECEIVER (on-site recipient of call):   DATE & TIME NOTIFIED: 07/12/21 at 11:43a  MESSENGER (representative from lab): Saa  MD NOTIFIED: yes  TIME OF NOTIFICATION: 11:44a  RESPONSE: waiting for response

## 2021-07-12 NOTE — Assessment & Plan Note (Signed)

## 2021-07-12 NOTE — Assessment & Plan Note (Signed)
Declines statin clinic for now, goal ldl < 70  Lab Results  Component Value Date   CHOL 201 (H) 07/11/2020   HDL 36.00 (L) 07/11/2020   LDLCALC 139 (H) 07/11/2020   LDLDIRECT 131.0 04/03/2017   TRIG 131.0 07/11/2020   CHOLHDL 6 07/11/2020

## 2021-07-12 NOTE — Assessment & Plan Note (Signed)
BP Readings from Last 3 Encounters:  07/12/21 126/78  02/20/21 118/82  02/13/21 122/82   Stable, pt to continue medical treatment lopressor

## 2021-07-12 NOTE — Assessment & Plan Note (Signed)
With chronic mild elevated LFTs - for wt loss, low fat diet

## 2021-07-12 NOTE — Assessment & Plan Note (Signed)
Lab Results  Component Value Date   LDLCALC 139 (H) 07/11/2020   Uncontrolled, goal ldl < 70, to add zetia 10 qd, pt o.w declines lipid clinic for now to consider repatha

## 2021-07-17 ENCOUNTER — Other Ambulatory Visit: Payer: Self-pay | Admitting: Internal Medicine

## 2021-07-18 DIAGNOSIS — Z6831 Body mass index (BMI) 31.0-31.9, adult: Secondary | ICD-10-CM | POA: Diagnosis not present

## 2021-07-18 DIAGNOSIS — M150 Primary generalized (osteo)arthritis: Secondary | ICD-10-CM | POA: Diagnosis not present

## 2021-07-18 DIAGNOSIS — E669 Obesity, unspecified: Secondary | ICD-10-CM | POA: Diagnosis not present

## 2021-07-18 DIAGNOSIS — M797 Fibromyalgia: Secondary | ICD-10-CM | POA: Diagnosis not present

## 2021-07-18 DIAGNOSIS — M7989 Other specified soft tissue disorders: Secondary | ICD-10-CM | POA: Diagnosis not present

## 2021-07-18 DIAGNOSIS — M0589 Other rheumatoid arthritis with rheumatoid factor of multiple sites: Secondary | ICD-10-CM | POA: Diagnosis not present

## 2021-07-18 DIAGNOSIS — M7021 Olecranon bursitis, right elbow: Secondary | ICD-10-CM | POA: Diagnosis not present

## 2021-07-18 DIAGNOSIS — M5030 Other cervical disc degeneration, unspecified cervical region: Secondary | ICD-10-CM | POA: Diagnosis not present

## 2021-07-26 ENCOUNTER — Other Ambulatory Visit: Payer: Self-pay | Admitting: Internal Medicine

## 2021-07-26 DIAGNOSIS — I252 Old myocardial infarction: Secondary | ICD-10-CM

## 2021-07-26 DIAGNOSIS — I10 Essential (primary) hypertension: Secondary | ICD-10-CM

## 2021-07-26 NOTE — Telephone Encounter (Signed)
Please refill as per office routine med refill policy (all routine meds to be refilled for 3 mo or monthly (per pt preference) up to one year from last visit, then month to month grace period for 3 mo, then further med refills will have to be denied) ? ?

## 2021-09-12 ENCOUNTER — Other Ambulatory Visit: Payer: Self-pay | Admitting: Internal Medicine

## 2021-09-14 ENCOUNTER — Other Ambulatory Visit: Payer: Self-pay

## 2021-09-14 ENCOUNTER — Other Ambulatory Visit: Payer: Self-pay | Admitting: Internal Medicine

## 2021-09-14 DIAGNOSIS — I10 Essential (primary) hypertension: Secondary | ICD-10-CM

## 2021-09-14 MED ORDER — LOSARTAN POTASSIUM 25 MG PO TABS: 25.0000 mg | ORAL_TABLET | Freq: Every day | ORAL | 2 refills | Status: DC

## 2021-09-17 DIAGNOSIS — Z0100 Encounter for examination of eyes and vision without abnormal findings: Secondary | ICD-10-CM | POA: Diagnosis not present

## 2021-09-17 DIAGNOSIS — H5203 Hypermetropia, bilateral: Secondary | ICD-10-CM | POA: Diagnosis not present

## 2021-09-26 DIAGNOSIS — M65871 Other synovitis and tenosynovitis, right ankle and foot: Secondary | ICD-10-CM | POA: Diagnosis not present

## 2021-09-26 DIAGNOSIS — M14671 Charcot's joint, right ankle and foot: Secondary | ICD-10-CM | POA: Diagnosis not present

## 2021-09-26 DIAGNOSIS — G5761 Lesion of plantar nerve, right lower limb: Secondary | ICD-10-CM | POA: Diagnosis not present

## 2021-10-10 DIAGNOSIS — M7751 Other enthesopathy of right foot: Secondary | ICD-10-CM | POA: Diagnosis not present

## 2021-10-10 DIAGNOSIS — G5761 Lesion of plantar nerve, right lower limb: Secondary | ICD-10-CM | POA: Diagnosis not present

## 2021-10-15 DIAGNOSIS — M0589 Other rheumatoid arthritis with rheumatoid factor of multiple sites: Secondary | ICD-10-CM | POA: Diagnosis not present

## 2021-10-19 DIAGNOSIS — Z85828 Personal history of other malignant neoplasm of skin: Secondary | ICD-10-CM | POA: Diagnosis not present

## 2021-10-19 DIAGNOSIS — D1801 Hemangioma of skin and subcutaneous tissue: Secondary | ICD-10-CM | POA: Diagnosis not present

## 2021-10-19 DIAGNOSIS — L820 Inflamed seborrheic keratosis: Secondary | ICD-10-CM | POA: Diagnosis not present

## 2021-10-19 DIAGNOSIS — L570 Actinic keratosis: Secondary | ICD-10-CM | POA: Diagnosis not present

## 2021-10-20 ENCOUNTER — Other Ambulatory Visit: Payer: Self-pay | Admitting: Internal Medicine

## 2021-10-20 DIAGNOSIS — I252 Old myocardial infarction: Secondary | ICD-10-CM

## 2021-10-20 DIAGNOSIS — I10 Essential (primary) hypertension: Secondary | ICD-10-CM

## 2021-10-20 NOTE — Telephone Encounter (Signed)
Please refill as per office routine med refill policy (all routine meds to be refilled for 3 mo or monthly (per pt preference) up to one year from last visit, then month to month grace period for 3 mo, then further med refills will have to be denied) ? ?

## 2021-12-05 ENCOUNTER — Other Ambulatory Visit: Payer: Self-pay | Admitting: Internal Medicine

## 2021-12-12 ENCOUNTER — Other Ambulatory Visit: Payer: Self-pay | Admitting: Internal Medicine

## 2022-01-10 ENCOUNTER — Ambulatory Visit: Payer: Medicare HMO | Admitting: Internal Medicine

## 2022-01-11 ENCOUNTER — Other Ambulatory Visit: Payer: Self-pay | Admitting: Internal Medicine

## 2022-01-16 DIAGNOSIS — M797 Fibromyalgia: Secondary | ICD-10-CM | POA: Diagnosis not present

## 2022-01-16 DIAGNOSIS — M5030 Other cervical disc degeneration, unspecified cervical region: Secondary | ICD-10-CM | POA: Diagnosis not present

## 2022-01-16 DIAGNOSIS — M1991 Primary osteoarthritis, unspecified site: Secondary | ICD-10-CM | POA: Diagnosis not present

## 2022-01-16 DIAGNOSIS — M7989 Other specified soft tissue disorders: Secondary | ICD-10-CM | POA: Diagnosis not present

## 2022-01-16 DIAGNOSIS — M0589 Other rheumatoid arthritis with rheumatoid factor of multiple sites: Secondary | ICD-10-CM | POA: Diagnosis not present

## 2022-01-16 DIAGNOSIS — E669 Obesity, unspecified: Secondary | ICD-10-CM | POA: Diagnosis not present

## 2022-01-16 DIAGNOSIS — Z6830 Body mass index (BMI) 30.0-30.9, adult: Secondary | ICD-10-CM | POA: Diagnosis not present

## 2022-01-28 ENCOUNTER — Other Ambulatory Visit: Payer: Self-pay | Admitting: Internal Medicine

## 2022-02-28 ENCOUNTER — Other Ambulatory Visit: Payer: Self-pay | Admitting: Internal Medicine

## 2022-02-28 DIAGNOSIS — Z8711 Personal history of peptic ulcer disease: Secondary | ICD-10-CM

## 2022-02-28 NOTE — Telephone Encounter (Signed)
Please refill as per office routine med refill policy (all routine meds to be refilled for 3 mo or monthly (per pt preference) up to one year from last visit, then month to month grace period for 3 mo, then further med refills will have to be denied) ? ?

## 2022-03-18 ENCOUNTER — Other Ambulatory Visit: Payer: Self-pay | Admitting: Internal Medicine

## 2022-03-28 ENCOUNTER — Ambulatory Visit: Payer: Medicare HMO | Admitting: Cardiology

## 2022-03-28 ENCOUNTER — Encounter: Payer: Self-pay | Admitting: Cardiology

## 2022-03-28 VITALS — BP 142/90 | HR 74 | Ht 72.0 in | Wt 208.0 lb

## 2022-03-28 DIAGNOSIS — E7801 Familial hypercholesterolemia: Secondary | ICD-10-CM

## 2022-03-28 DIAGNOSIS — I10 Essential (primary) hypertension: Secondary | ICD-10-CM | POA: Diagnosis not present

## 2022-03-28 DIAGNOSIS — I712 Thoracic aortic aneurysm, without rupture, unspecified: Secondary | ICD-10-CM | POA: Diagnosis not present

## 2022-03-28 DIAGNOSIS — I25110 Atherosclerotic heart disease of native coronary artery with unstable angina pectoris: Secondary | ICD-10-CM | POA: Diagnosis not present

## 2022-03-28 MED ORDER — LOSARTAN POTASSIUM 50 MG PO TABS: 50.0000 mg | ORAL_TABLET | Freq: Every day | ORAL | 3 refills | Status: DC

## 2022-03-28 NOTE — Addendum Note (Signed)
Addended by: Ricci Barker on: 03/28/2022 04:18 PM   Modules accepted: Orders

## 2022-03-28 NOTE — Progress Notes (Signed)
Referring-[name] Jenny Reichmann, MD Reason for referral-coronary artery disease  HPI: 71 year old male for evaluation of coronary artery disease at request of Cathlean Cower, MD.  Previously followed by Dr. Ellyn Hack but not seen since 2019. Patient is status post cardiac catheterization in 2019 which revealed 90% ostial circumflex, 50% proximal right coronary artery, 50% mid right coronary artery.  Patient had PCI of left circumflex with a drug-eluting stent.  Echocardiogram January 2019 showed ejection fraction 45 to 50%, moderate left ventricular hypertrophy, grade 1 diastolic dysfunction, trace aortic insufficiency.  Nuclear study December 2019 showed ejection fraction 47% but visually appears better; inferolateral defect suggestive of diaphragmatic attenuation but small area of inferolateral ischemia cannot be excluded. CTA December 2019 showed 4.6 cm ascending thoracic aortic aneurysm.  Patient is intolerant to statins.  Patient has mild dyspnea on exertion but no orthopnea, PND, pedal edema or syncope.  He has occasional pain in his chest but states he has arthritis in his chest.  He does not have exertional chest pain.  He also states he has had elevated blood pressure recently with associated headache.  Cardiology now asked to evaluate.  Current Outpatient Medications  Medication Sig Dispense Refill   aspirin 81 MG EC tablet Take 1 tablet (81 mg total) by mouth daily. 30 tablet 11   B Complex-Folic Acid (B COMPLEX PLUS) TABS Take 1 tablet by mouth daily. 100 tablet 3   Cholecalciferol (VITAMIN D) 50 MCG (2000 UT) CAPS Take by mouth.     clonazePAM (KLONOPIN) 1 MG tablet TAKE ONE TABLET BY MOUTH TWICE DAILY AS NEEDED FOR ANXIETY 60 tablet 5   ezetimibe (ZETIA) 10 MG tablet Take 1 tablet (10 mg total) by mouth daily. 90 tablet 3   folic acid (FOLVITE) 010 MCG tablet Take 400 mcg by mouth daily.     hydroxychloroquine (PLAQUENIL) 200 MG tablet Take 200 mg by mouth 2 (two) times daily.     leflunomide  (ARAVA) 20 MG tablet Take 20 mg by mouth daily.       losartan (COZAAR) 25 MG tablet Take 1 tablet (25 mg total) by mouth daily. 90 tablet 2   metoprolol tartrate (LOPRESSOR) 25 MG tablet TAKE ONE TABLET BY MOUTH TWICE DAILY 180 tablet 1   Omega-3 Fatty Acids (FISH OIL) 1200 MG CAPS Take by mouth.     sildenafil (VIAGRA) 100 MG tablet TAKE ONE TABLET BY MOUTH DAILY AS NEEDED 10 tablet 0   Turmeric 500 MG CAPS Take by mouth.     zolpidem (AMBIEN) 10 MG tablet TAKE ONE TABLET BY MOUTH DAILY AT BEDTIME 90 tablet 1   No current facility-administered medications for this visit.    Allergies  Allergen Reactions   Atorvastatin     REACTION: myalgias   Ciprofloxacin Other (See Comments)    myalgias   Crestor [Rosuvastatin]     myalgias   Doxycycline    Hydroxychloroquine Sulfate     REACTION: GI upset and H, pt is currently taking and still has some GI problems but is tolerating it     Past Medical History:  Diagnosis Date   ALLERGIC RHINITIS 04/30/2007   Allergy    mild   ANEMIA-NOS 07/12/2007   ANXIETY 02/17/2009   Blood transfusion without reported diagnosis    bleeding ulcers in 2008 or 2009   Cancer [facility]) 10/2019   lip cancer removed    Cataract    bilat removed    Clotting disorder ([place])    PE 2009- pt unaware  of this finding    Coronary artery disease involving native coronary artery of native heart with unstable angina pectoris ([place]) 05/10/2011   DEPRESSIVE DISORDER 02/14/2009   Fibromyalgia    GERD (gastroesophageal reflux disease)    History of pulmonary embolism 03/09/2008   HYPERLIPIDEMIA 04/27/2007   HYPERTENSION 04/27/2007   INSOMNIA UNSPECIFIED 02/20/2010   Lupus ([place])    "w/RA" (08/20/2017)   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   Migraine    "stopped in the early 2000s" (08/20/2017)   Non-ST elevation (NSTEMI) myocardial infarction ([place]) -TYPE 4A peri-PCI 08/21/2017   Nonspecific abnormal toxicological findings 03/14/2008   NSTEMI (non-ST elevated myocardial  infarction) ([place]) 08/20/2017   OBESITY 04/30/2007   OSTEOARTHRITIS 04/30/2007   PEPTIC ULCER DISEASE 07/12/2007   Presence of drug coated stent in left circumflex coronary artery 08/21/2017   PROSTATITIS, HX OF 04/30/2007   RESTRICTIVE LUNG DISEASE 06/20/2009   Rheumatoid arthritis ([place])    THORACIC AORTIC ANEURYSM 07/06/2009   Chest CT 11/12: ascending thoracic aortic aneurysm stable at 42 mm.    UNSPECIFIED MYALGIA AND MYOSITIS 02/14/2009    Past Surgical History:  Procedure Laterality Date   APPENDECTOMY     BACK SURGERY  2017   cervical discectomy C3-C4    CARDIAC CATHETERIZATION  06/2008   Non-obstructive CAD.  EF 40-45%. Anterior - apical HK   COLONOSCOPY  2010   CORONARY ANGIOPLASTY WITH STENT PLACEMENT  08/20/2017   CORONARY STENT INTERVENTION N/A 08/20/2017   Procedure: CORONARY STENT INTERVENTION;  Surgeon: [name], MD;  Location: [place] [facility];  Service: Cardiovascular;  Laterality: N/A;   LEFT HEART CATH AND CORONARY ANGIOGRAPHY N/A 08/20/2017   Procedure: LEFT HEART CATH AND CORONARY ANGIOGRAPHY;  Surgeon: [name], MD;  Location: [place] [facility] [facility];  Service: Cardiovascular;  Laterality: N/A;   NM MYOVIEW LTD  06/2009   Abnormal stress nuclear study with small prior lateral infarct; no ischemia. Normal EF   POLYPECTOMY  04/14/2009   TA x 1   SURGERY OF LIP  10/2019   TRANSTHORACIC ECHOCARDIOGRAM  06/2009   55% to 60%. Wall motion was normal.  Mod Dilated Aoi Root.  Mild LA dilation.   UPPER GASTROINTESTINAL ENDOSCOPY      Social History   Socioeconomic History   Marital status: Married    Spouse name: Not on file   Number of children: Not on file   Years of education: Not on file   Highest education level: Not on file  Occupational History   Not on file  Tobacco Use   Smoking status: Never   Smokeless tobacco: Never  Vaping Use   Vaping Use: Never used  Substance and Sexual Activity   Alcohol use: No   Drug use: No   Sexual  activity: Yes  Other Topics Concern   Not on file  Social History Narrative   Not on file   Social Determinants of Health   Financial Resource Strain: Not on file  Food Insecurity: Not on file  Transportation Needs: Not on file  Physical Activity: Not on file  Stress: Not on file  Social Connections: Not on file  Intimate Partner Violence: Not on file    Family History  Problem Relation Age of Onset   Diabetes Mother    Heart disease Mother    Hyperlipidemia Mother    Stroke Mother    Diabetes Father    Heart disease Father    Hyperlipidemia Father    Stroke Father    Hypertension  Sister    Diabetes Brother    Diabetes Brother    Heart disease Brother    Hypertension Brother    Alcohol abuse Other    Diabetes Other    Hypertension Other    Heart disease Other    Stroke Other    Colon cancer Neg Hx    Colon polyps Neg Hx    Esophageal cancer Neg Hx    Stomach cancer Neg Hx    Rectal cancer Neg Hx     ROS: Headache but no fevers or chills, productive cough, hemoptysis, dysphasia, odynophagia, melena, hematochezia, dysuria, hematuria, rash, seizure activity, orthopnea, PND, pedal edema, claudication. Remaining systems are negative.  Physical Exam:   Blood pressure (!) 142/90, pulse 74, height 6' (1.829 m), weight 208 lb (94.3 kg), SpO2 99 %.  General:  Well developed/well nourished in NAD Skin warm/dry Patient not depressed No peripheral clubbing Back-normal HEENT-normal/normal eyelids Neck supple/normal carotid upstroke bilaterally; no bruits; no JVD; no thyromegaly chest - CTA/ normal expansion CV - RRR/normal S1 and S2; no murmurs, rubs or gallops;  PMI nondisplaced Abdomen -NT/ND, no HSM, no mass, + bowel sounds, positive bruit 2+ femoral pulses, no bruits Ext-no edema, chords, 2+ DP Neuro-grossly nonfocal  ECG -normal sinus rhythm with no ST changes.  Personally reviewed  A/P  1 coronary artery disease-continue aspirin.  He is intolerant to  statins.  Given mild LV dysfunction in the past we will repeat echocardiogram.  2 hyperlipidemia-intolerant to statins.  We will check lipids.  If LDL elevated will refer to lipid clinic for [place], [place] or [place].  3 hypertension-blood pressure has been elevated at home.  Blood pressure also elevated here.  Will increase losartan to 50 mg daily.  Follow blood pressure and adjust medications as needed.  Goal systolic blood pressure less than 644 and diastolic less than 85.  4 history of thoracic aortic aneurysm-we will arrange repeat CTA of thoracic aorta.  5 abdominal bruit-noted on exam and also father had abdominal aortic aneurysm.  We will scan his thoracic and abdominal aorta at time of CTA.  Kirk Ruths, MD

## 2022-03-28 NOTE — Patient Instructions (Addendum)
Medication Instructions:  INCREASE the Losartan to 50 mg once daily  *If you need a refill on your cardiac medications before your next appointment, please call your pharmacy*   Lab Work: Your provider would like for you to return in one week to have the following labs drawn: FASTING LIPID, LIVER and BMET. You do not need an appointment for the lab. Once in our office lobby there is a podium where you can sign in and ring the doorbell to alert [place] that you are here. The lab is open from 8:00 am to 4 pm; closed for lunch from 12:45pm-1:45pm.  You may also go to any of these [facility] locations:   [place] ([place]) - [place] [address] [employer] [address]    If you have labs (blood work) drawn today and your tests are completely normal, you will receive your results only by: [employer] (if you have MyChart) OR A paper copy in the mail If you have any lab test that is abnormal or we need to change your treatment, we will call you to review the results.   Testing/Procedures: Non-Cardiac CT scanning, (CAT scanning), is a noninvasive, special x-ray that produces cross-sectional images of the body using x-rays and a computer. CT scans help physicians diagnose and treat medical conditions. For some CT exams, a contrast material is used to enhance visibility in the area of the body being studied. CT scans provide greater clarity and reveal more details than regular x-ray exams. This will take place at [facility], [address]  Your physician has requested that you have an echocardiogram. Echocardiography is a painless test that uses sound waves to create images of your heart. It provides your doctor with information about the size and shape of your heart and how well your heart's chambers and valves are working. You may receive an ultrasound enhancing agent through an IV if needed to better visualize your heart  during the echo.This procedure takes approximately one hour. There are no restrictions for this procedure. This will take place at the [address].    Follow-Up: At [facility], you and your health needs are our priority.  As part of our continuing mission to provide you with exceptional heart care, we have created designated Provider Care Teams.  These Care Teams include your primary Cardiologist (physician) and Advanced Practice Providers (APPs -  Physician Assistants and Nurse Practitioners) who all work together to provide you with the care you need, when you need it.  We recommend signing up for the patient portal called "MyChart".  Sign up information is provided on this After Visit Summary.  MyChart is used to connect with patients for Virtual Visits (Telemedicine).  Patients are able to view lab/test results, encounter notes, upcoming appointments, etc.  Non-urgent messages can be sent to your provider as well.   To learn more about what you can do with MyChart, go to [email or web address].    Your next appointment:   3 month(s)  The format for your next appointment:   In Person  Provider:   Dr. Stanford Breed

## 2022-04-04 DIAGNOSIS — I10 Essential (primary) hypertension: Secondary | ICD-10-CM | POA: Diagnosis not present

## 2022-04-04 DIAGNOSIS — I25110 Atherosclerotic heart disease of native coronary artery with unstable angina pectoris: Secondary | ICD-10-CM | POA: Diagnosis not present

## 2022-04-04 LAB — LIPID PANEL
Chol/HDL Ratio: 4.8 ratio (ref 0.0–5.0)
Cholesterol, Total: 182 mg/dL (ref 100–199)
HDL: 38 mg/dL — ABNORMAL LOW (ref >39)
LDL Chol Calc (NIH): 123 mg/dL — ABNORMAL HIGH (ref 0–99)
Triglycerides: 116 mg/dL (ref 0–149)
VLDL Cholesterol Cal: 21 mg/dL (ref 5–40)

## 2022-04-04 LAB — BASIC METABOLIC PANEL
BUN/Creatinine Ratio: 12 (ref 10–24)
BUN: 18 mg/dL (ref 8–27)
CO2: 27 mmol/L (ref 20–29)
Calcium: 9.9 mg/dL (ref 8.6–10.2)
Chloride: 101 mmol/L (ref 96–106)
Creatinine, Ser: 1.47 mg/dL — ABNORMAL HIGH (ref 0.76–1.27)
Glucose: 89 mg/dL (ref 70–99)
Potassium: 4.5 mmol/L (ref 3.5–5.2)
Sodium: 140 mmol/L (ref 134–144)
eGFR: 51 mL/min/{1.73_m2} — ABNORMAL LOW (ref >59)

## 2022-04-04 LAB — HEPATIC FUNCTION PANEL
ALT: 36 IU/L (ref 0–44)
AST: 33 IU/L (ref 0–40)
Albumin: 4.5 g/dL (ref 3.9–4.9)
Alkaline Phosphatase: 75 IU/L (ref 44–121)
Bilirubin Total: 0.5 mg/dL (ref 0.0–1.2)
Bilirubin, Direct: 0.16 mg/dL (ref 0.00–0.40)
Total Protein: 6.9 g/dL (ref 6.0–8.5)

## 2022-04-09 ENCOUNTER — Other Ambulatory Visit: Payer: Self-pay | Admitting: *Deleted

## 2022-04-09 DIAGNOSIS — G5761 Lesion of plantar nerve, right lower limb: Secondary | ICD-10-CM | POA: Diagnosis not present

## 2022-04-09 DIAGNOSIS — E7801 Familial hypercholesterolemia: Secondary | ICD-10-CM

## 2022-04-09 DIAGNOSIS — M7751 Other enthesopathy of right foot: Secondary | ICD-10-CM | POA: Diagnosis not present

## 2022-04-10 ENCOUNTER — Telehealth: Payer: Self-pay | Admitting: Cardiology

## 2022-04-10 NOTE — Telephone Encounter (Signed)
Pt would like a callback regarding upcoming ECHO and CT. Pt states that his insurance has not received anything in regards to these test. Please advise

## 2022-04-12 ENCOUNTER — Other Ambulatory Visit: Payer: Self-pay | Admitting: Internal Medicine

## 2022-04-12 ENCOUNTER — Telehealth: Payer: Self-pay | Admitting: Cardiology

## 2022-04-12 DIAGNOSIS — I252 Old myocardial infarction: Secondary | ICD-10-CM

## 2022-04-12 DIAGNOSIS — I10 Essential (primary) hypertension: Secondary | ICD-10-CM

## 2022-04-12 NOTE — Telephone Encounter (Signed)
Patient stated Holland Falling needs preauthorization for the echo. Patient is available to come on the original date of 9/11.

## 2022-04-12 NOTE — Telephone Encounter (Signed)
Please refill as per office routine med refill policy (all routine meds to be refilled for 3 mo or monthly (per pt preference) up to one year from last visit, then month to month grace period for 3 mo, then further med refills will have to be denied) ? ?

## 2022-04-12 NOTE — Telephone Encounter (Signed)
Patient cancelled echo because insurance would not pay for it unless requested from doctor

## 2022-04-15 ENCOUNTER — Other Ambulatory Visit (HOSPITAL_COMMUNITY): Payer: Medicare HMO

## 2022-04-17 ENCOUNTER — Ambulatory Visit
Admission: RE | Admit: 2022-04-17 | Discharge: 2022-04-17 | Disposition: A | Discharge status: Home / Self Care | Payer: Medicare HMO | Source: Ambulatory Visit | Attending: Cardiology | Admitting: Cardiology

## 2022-04-17 DIAGNOSIS — I712 Thoracic aortic aneurysm, without rupture, unspecified: Secondary | ICD-10-CM

## 2022-04-17 MED ORDER — IOPAMIDOL (ISOVUE-370) INJECTION 76%
75.0000 mL | Freq: Once | INTRAVENOUS | Status: AC | PRN
Start: 2022-04-17 — End: 2022-04-17
  Administered 2022-04-17: 75 mL via INTRAVENOUS

## 2022-04-19 DIAGNOSIS — I129 Hypertensive chronic kidney disease with stage 1 through stage 4 chronic kidney disease, or unspecified chronic kidney disease: Secondary | ICD-10-CM | POA: Diagnosis not present

## 2022-04-19 DIAGNOSIS — Z008 Encounter for other general examination: Secondary | ICD-10-CM | POA: Diagnosis not present

## 2022-04-19 DIAGNOSIS — N182 Chronic kidney disease, stage 2 (mild): Secondary | ICD-10-CM | POA: Diagnosis not present

## 2022-04-19 DIAGNOSIS — M069 Rheumatoid arthritis, unspecified: Secondary | ICD-10-CM | POA: Diagnosis not present

## 2022-04-19 DIAGNOSIS — F419 Anxiety disorder, unspecified: Secondary | ICD-10-CM | POA: Diagnosis not present

## 2022-04-19 DIAGNOSIS — M1990 Unspecified osteoarthritis, unspecified site: Secondary | ICD-10-CM | POA: Diagnosis not present

## 2022-04-19 DIAGNOSIS — G4700 Insomnia, unspecified: Secondary | ICD-10-CM | POA: Diagnosis not present

## 2022-04-19 DIAGNOSIS — Z7969 Long term (current) use of other immunomodulators and immunosuppressants: Secondary | ICD-10-CM | POA: Diagnosis not present

## 2022-04-19 DIAGNOSIS — I252 Old myocardial infarction: Secondary | ICD-10-CM | POA: Diagnosis not present

## 2022-04-19 DIAGNOSIS — R69 Illness, unspecified: Secondary | ICD-10-CM | POA: Diagnosis not present

## 2022-04-19 DIAGNOSIS — D84821 Immunodeficiency due to drugs: Secondary | ICD-10-CM | POA: Diagnosis not present

## 2022-04-19 DIAGNOSIS — I2510 Atherosclerotic heart disease of native coronary artery without angina pectoris: Secondary | ICD-10-CM | POA: Diagnosis not present

## 2022-04-19 DIAGNOSIS — K219 Gastro-esophageal reflux disease without esophagitis: Secondary | ICD-10-CM | POA: Diagnosis not present

## 2022-04-19 DIAGNOSIS — N529 Male erectile dysfunction, unspecified: Secondary | ICD-10-CM | POA: Diagnosis not present

## 2022-04-23 DIAGNOSIS — G5761 Lesion of plantar nerve, right lower limb: Secondary | ICD-10-CM | POA: Diagnosis not present

## 2022-05-02 ENCOUNTER — Ambulatory Visit (HOSPITAL_COMMUNITY): Payer: Medicare HMO | Attending: Cardiology

## 2022-05-02 DIAGNOSIS — I25110 Atherosclerotic heart disease of native coronary artery with unstable angina pectoris: Secondary | ICD-10-CM

## 2022-05-02 LAB — ECHOCARDIOGRAM COMPLETE
Area-P 1/2: 2.39 cm2
P 1/2 time: 553 msec
S' Lateral: 3.3 cm

## 2022-05-07 ENCOUNTER — Encounter: Payer: Self-pay | Admitting: Pharmacist

## 2022-05-07 ENCOUNTER — Telehealth: Payer: Self-pay | Admitting: Pharmacist

## 2022-05-07 ENCOUNTER — Ambulatory Visit: Payer: Medicare HMO | Attending: Cardiology | Admitting: Pharmacist

## 2022-05-07 DIAGNOSIS — E782 Mixed hyperlipidemia: Secondary | ICD-10-CM

## 2022-05-07 DIAGNOSIS — M791 Myalgia, unspecified site: Secondary | ICD-10-CM | POA: Diagnosis not present

## 2022-05-07 DIAGNOSIS — T466X5A Adverse effect of antihyperlipidemic and antiarteriosclerotic drugs, initial encounter: Secondary | ICD-10-CM | POA: Diagnosis not present

## 2022-05-07 DIAGNOSIS — I214 Non-ST elevation (NSTEMI) myocardial infarction: Secondary | ICD-10-CM

## 2022-05-07 DIAGNOSIS — I249 Acute ischemic heart disease, unspecified: Secondary | ICD-10-CM

## 2022-05-07 MED ORDER — PRALUENT 75 MG/ML ~~LOC~~ SOAJ: 1.0000 mL | SUBCUTANEOUS | 11 refills | Status: DC

## 2022-05-07 NOTE — Telephone Encounter (Signed)
Praluent PA approved through 08/04/22

## 2022-05-07 NOTE — Progress Notes (Signed)
Patient ID: [name] Michael                 DOB: [birth year]                    MRN: [id number]     HPI: [name] Michael is a 71 y.o. male patient referred to lipid clinic by Dr Stanford Breed. PMH is significant for NSTEMI, PE, CAD,  HTN, TAA, and statin myalgia.  Patient had cardiac cath in 2019 which showed 90% ostial circumflex, 50% proximal right coronary artery, 50% mid right coronary artery. Intolerant to statins.  Presents today to discuss further options. Very quiet in room. Has had myalgias to atorvastatin, rosuvastatin, and pravastatin. Previously on Zetia but has also discontinued. Has not tried PCSK9i, inclisiran, or bempedoic acid.  TAA has remained stable in size for past 3 years.  Current Medications: N/A  Intolerances:  Zetia Pravastatin Rosuvastatin Atorvastatin  Risk Factors:  CAD Aortic Atherosclerosis NSTEMI  LDL goal: <70  Labs: TC 182, Trigs 116, HDL 38, LDL 123 (04/04/22 - not on any lipid lowering therapy)  Past Medical History:  Diagnosis Date   ALLERGIC RHINITIS 04/30/2007   Allergy    mild   ANEMIA-NOS 07/12/2007   ANXIETY 02/17/2009   Blood transfusion without reported diagnosis    bleeding ulcers in 2008 or 2009   Cancer ([place]) 10/2019   lip cancer removed    Cataract    bilat removed    Clotting disorder ([place])    PE 2009- pt unaware of this finding    Coronary artery disease involving native coronary artery of native heart with unstable angina pectoris ([place]) 05/10/2011   DEPRESSIVE DISORDER 02/14/2009   Fibromyalgia    GERD (gastroesophageal reflux disease)    History of pulmonary embolism 03/09/2008   HYPERLIPIDEMIA 04/27/2007   HYPERTENSION 04/27/2007   INSOMNIA UNSPECIFIED 02/20/2010   Lupus ([place])    "w/RA" (08/20/2017)   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   Migraine    "stopped in the early 2000s" (08/20/2017)   Non-ST elevation (NSTEMI) myocardial infarction ([place]) -TYPE 4A peri-PCI 08/21/2017   Nonspecific abnormal toxicological  findings 03/14/2008   NSTEMI (non-ST elevated myocardial infarction) ([place]) 08/20/2017   OBESITY 04/30/2007   OSTEOARTHRITIS 04/30/2007   PEPTIC ULCER DISEASE 07/12/2007   Presence of drug coated stent in left circumflex coronary artery 08/21/2017   PROSTATITIS, HX OF 04/30/2007   RESTRICTIVE LUNG DISEASE 06/20/2009   Rheumatoid arthritis ([place])    THORACIC AORTIC ANEURYSM 07/06/2009   Chest CT 11/12: ascending thoracic aortic aneurysm stable at 42 mm.    UNSPECIFIED MYALGIA AND MYOSITIS 02/14/2009    Current Outpatient Medications on File Prior to Visit  Medication Sig Dispense Refill   aspirin 81 MG EC tablet Take 1 tablet (81 mg total) by mouth daily. 30 tablet 11   B Complex-Folic Acid (B COMPLEX PLUS) TABS Take 1 tablet by mouth daily. 100 tablet 3   Cholecalciferol (VITAMIN D) 50 MCG (2000 UT) CAPS Take by mouth.     clonazePAM (KLONOPIN) 1 MG tablet TAKE ONE TABLET BY MOUTH TWICE DAILY AS NEEDED FOR ANXIETY 60 tablet 5   folic acid (FOLVITE) 409 MCG tablet Take 400 mcg by mouth daily.     hydroxychloroquine (PLAQUENIL) 200 MG tablet Take 200 mg by mouth 2 (two) times daily.     leflunomide (ARAVA) 20 MG tablet Take 20 mg by mouth daily.       losartan (COZAAR) 50 MG tablet Take 1 tablet (50 mg total)  by mouth daily. 90 tablet 3   metoprolol tartrate (LOPRESSOR) 25 MG tablet TAKE ONE TABLET BY MOUTH TWICE DAILY 180 tablet 0   Omega-3 Fatty Acids (FISH OIL) 1200 MG CAPS Take by mouth.     sildenafil (VIAGRA) 100 MG tablet TAKE ONE TABLET BY MOUTH DAILY AS NEEDED 10 tablet 0   Turmeric 500 MG CAPS Take by mouth.     zolpidem (AMBIEN) 10 MG tablet TAKE ONE TABLET BY MOUTH DAILY AT BEDTIME 90 tablet 1   No current facility-administered medications on file prior to visit.    Allergies  Allergen Reactions   Atorvastatin     REACTION: myalgias   Ciprofloxacin Other (See Comments)    myalgias   Crestor [Rosuvastatin]     myalgias   Doxycycline    Hydroxychloroquine Sulfate      REACTION: GI upset and H, pt is currently taking and still has some GI problems but is tolerating it    Assessment/Plan:  1. Hyperlipidemia - Patient LDL 123 which is above goal of <70. Unfortunately is intolerant to statins. Recommend starting PCSK9i therapy.  Using demo pen, educated patient on mechanism of action, storage, site selection, administration, and possible side effects. Will complete PA and contact patient when approved. Repeat lipid panel in 2-3 months. Patient had no further questions.  Start Repatha/Praluent q 2 weeks Recheck lipid panel in 2-3 months  Karren Cobble, PharmD, [place] [place], [place], [place], [place], [address] Phone: [phone], Fax: [phone]

## 2022-05-07 NOTE — Addendum Note (Signed)
Addended by: Rollen Sox on: 05/07/2022 11:10 AM   Modules accepted: Orders

## 2022-05-07 NOTE — Telephone Encounter (Signed)
PA submitted for Praluent '75mg'$ . Key: [id number]

## 2022-05-07 NOTE — Patient Instructions (Signed)
It was nice meeting you today  We would like your LDL (bad cholesterol) to be less than 70  We would like to start you on a new medication called Repatha or Praluent. Both are given once every 2 weeks  I will complete the prior authorization for you and contact you when it is approved  Once you start the medication we will recheck your cholesterol levels in 2-3 months  Please call with any questions  Karren Cobble, PharmD, [place], [place], [place], [place], [place], [address] Phone: [phone], Fax: [phone]

## 2022-05-23 ENCOUNTER — Other Ambulatory Visit: Payer: Self-pay | Admitting: Internal Medicine

## 2022-05-27 ENCOUNTER — Other Ambulatory Visit: Payer: Self-pay | Admitting: Internal Medicine

## 2022-06-10 NOTE — Progress Notes (Signed)
HPI: FU CAD. Patient is status post cardiac catheterization in 2019 which revealed 90% ostial circumflex, 50% proximal right coronary artery, 50% mid right coronary artery.  Patient had PCI of left circumflex with a drug-eluting stent. Nuclear study December 2019 showed ejection fraction 47% but visually appears better; inferolateral defect suggestive of diaphragmatic attenuation but small area of inferolateral ischemia cannot be excluded. Patient is intolerant to statins.  Echocardiogram September 2023 showed normal LV function, dilated ascending aorta at 4.7 cm, mild mitral regurgitation, trace aortic insufficiency.  CTA September 2023 showed stable 4.6 cm ascending thoracic aortic aneurysm.  Since last seen, he has dyspnea with more vigorous activities but not routine activities.  No orthopnea, PND, pedal edema, exertional chest pain or syncope.  Current Outpatient Medications  Medication Sig Dispense Refill   Ascorbic Acid (VITAMIN C PO) Take 1 tablet by mouth daily in the afternoon.     aspirin 81 MG EC tablet Take 1 tablet (81 mg total) by mouth daily. 30 tablet 11   B Complex-Folic Acid (B COMPLEX PLUS) TABS Take 1 tablet by mouth daily. 100 tablet 3   Cholecalciferol (VITAMIN D) 50 MCG (2000 UT) CAPS Take 2,000 Units by mouth daily in the afternoon.     clonazePAM (KLONOPIN) 1 MG tablet TAKE ONE TABLET BY MOUTH TWICE DAILY AS NEEDED FOR ANXIETY 60 tablet 5   folic acid (FOLVITE) 161 MCG tablet Take 400 mcg by mouth daily.     hydroxychloroquine (PLAQUENIL) 200 MG tablet Take 200 mg by mouth 2 (two) times daily.     leflunomide (ARAVA) 20 MG tablet Take 20 mg by mouth daily.       losartan (COZAAR) 50 MG tablet Take 1 tablet (50 mg total) by mouth daily. 90 tablet 3   metoprolol tartrate (LOPRESSOR) 25 MG tablet TAKE ONE TABLET BY MOUTH TWICE DAILY 180 tablet 0   Omega-3 Fatty Acids (FISH OIL) 1200 MG CAPS Take 1,200 mg by mouth daily in the afternoon.     omeprazole (PRILOSEC) 20 MG  capsule Take 20 mg by mouth daily.     sildenafil (VIAGRA) 100 MG tablet TAKE ONE TABLET BY MOUTH DAILY AS NEEDED 10 tablet 0   Turmeric 500 MG CAPS Take 500 mg by mouth daily in the afternoon.     zolpidem (AMBIEN) 10 MG tablet TAKE ONE TABLET BY MOUTH DAILY AT BEDTIME 90 tablet 1   Alirocumab (PRALUENT) 75 MG/ML SOAJ Inject 1 mL into the skin every 14 (fourteen) days. (Patient not taking: Reported on 06/24/2022) 2 mL 11   No current facility-administered medications for this visit.     Past Medical History:  Diagnosis Date   ALLERGIC RHINITIS 04/30/2007   Allergy    mild   ANEMIA-NOS 07/12/2007   ANXIETY 02/17/2009   Blood transfusion without reported diagnosis    bleeding ulcers in 2008 or 2009   Cancer [facility]) 10/2019   lip cancer removed    Cataract    bilat removed    Clotting disorder ([place])    PE 2009- pt unaware of this finding    Coronary artery disease involving native coronary artery of native heart with unstable angina pectoris ([place]) 05/10/2011   DEPRESSIVE DISORDER 02/14/2009   Fibromyalgia    GERD (gastroesophageal reflux disease)    History of pulmonary embolism 03/09/2008   HYPERLIPIDEMIA 04/27/2007   HYPERTENSION 04/27/2007   INSOMNIA UNSPECIFIED 02/20/2010   Lupus ([place])    "w/RA" (08/20/2017)   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  Migraine    "stopped in the early 2000s" (08/20/2017)   Non-ST elevation (NSTEMI) myocardial infarction ([place]) -TYPE 4A peri-PCI 08/21/2017   Nonspecific abnormal toxicological findings 03/14/2008   NSTEMI (non-ST elevated myocardial infarction) ([place]) 08/20/2017   OBESITY 04/30/2007   OSTEOARTHRITIS 04/30/2007   PEPTIC ULCER DISEASE 07/12/2007   Presence of drug coated stent in left circumflex coronary artery 08/21/2017   PROSTATITIS, HX OF 04/30/2007   RESTRICTIVE LUNG DISEASE 06/20/2009   Rheumatoid arthritis ([place])    THORACIC AORTIC ANEURYSM 07/06/2009   Chest CT 11/12: ascending thoracic aortic aneurysm stable at 42 mm.     UNSPECIFIED MYALGIA AND MYOSITIS 02/14/2009    Past Surgical History:  Procedure Laterality Date   APPENDECTOMY     BACK SURGERY  2017   cervical discectomy C3-C4    CARDIAC CATHETERIZATION  06/2008   Non-obstructive CAD.  EF 40-45%. Anterior - apical HK   COLONOSCOPY  2010   CORONARY ANGIOPLASTY WITH STENT PLACEMENT  08/20/2017   CORONARY STENT INTERVENTION N/A 08/20/2017   Procedure: CORONARY STENT INTERVENTION;  Surgeon: [name], MD;  Location: [facility] [facility];  Service: Cardiovascular;  Laterality: N/A;   LEFT HEART CATH AND CORONARY ANGIOGRAPHY N/A 08/20/2017   Procedure: LEFT HEART CATH AND CORONARY ANGIOGRAPHY;  Surgeon: [name], MD;  Location: [facility] [facility];  Service: Cardiovascular;  Laterality: N/A;   NM MYOVIEW LTD  06/2009   Abnormal stress nuclear study with small prior lateral infarct; no ischemia. Normal EF   POLYPECTOMY  04/14/2009   TA x 1   SURGERY OF LIP  10/2019   TRANSTHORACIC ECHOCARDIOGRAM  06/2009   55% to 60%. Wall motion was normal.  Mod Dilated [place].  Mild LA dilation.   UPPER GASTROINTESTINAL ENDOSCOPY      Social History   Socioeconomic History   Marital status: Married    Spouse name: Not on file   Number of children: Not on file   Years of education: Not on file   Highest education level: Not on file  Occupational History   Not on file  Tobacco Use   Smoking status: Never   Smokeless tobacco: Never  Vaping Use   Vaping Use: Never used  Substance and Sexual Activity   Alcohol use: No   Drug use: No   Sexual activity: Yes  Other Topics Concern   Not on file  Social History Narrative   Not on file   Social Determinants of Health   Financial Resource Strain: Not on file  Food Insecurity: Not on file  Transportation Needs: Not on file  Physical Activity: Not on file  Stress: Not on file  Social Connections: Not on file  Intimate Partner Violence: Not on file    Family History  Problem Relation Age of  Onset   Diabetes Mother    Heart disease Mother    Hyperlipidemia Mother    Stroke Mother    Diabetes Father    Heart disease Father    Hyperlipidemia Father    Stroke Father    Hypertension Sister    Diabetes Brother    Diabetes Brother    Heart disease Brother    Hypertension Brother    Alcohol abuse Other    Diabetes Other    Hypertension Other    Heart disease Other    Stroke Other    Colon cancer Neg Hx    Colon polyps Neg Hx    Esophageal cancer Neg Hx    Stomach  cancer Neg Hx    Rectal cancer Neg Hx     ROS: no fevers or chills, productive cough, hemoptysis, dysphasia, odynophagia, melena, hematochezia, dysuria, hematuria, rash, seizure activity, orthopnea, PND, pedal edema, claudication. Remaining systems are negative.  Physical Exam: Well-developed well-nourished in no acute distress.  Skin is warm and dry.  HEENT is normal.  Neck is supple.  Chest is clear to auscultation with normal expansion.  Cardiovascular exam is regular rate and rhythm.  Abdominal exam nontender or distended. No masses palpated.  Positive bruit. Extremities show no edema. neuro grossly intact  A/P  1 coronary artery disease-patient denies chest pain.  Continue aspirin.  Intolerant to statins.  2 thoracic aortic aneurysm-plan follow-up CTA March 2024.  3 hypertension-blood pressure mildly elevated.  I have asked him to follow this at home and we will advance regimen as needed.  4 hyperlipidemia-patient is intolerant to statins.  Add Zetia 10 mg daily.  Check lipids and liver in 8 weeks.  He was screened for PCSK9 inhibitors previously but declined.  5 abdominal bruit-schedule ultrasound to exclude aneurysm.  Kirk Ruths, MD

## 2022-06-24 ENCOUNTER — Ambulatory Visit: Payer: Medicare HMO | Attending: Cardiology | Admitting: Cardiology

## 2022-06-24 ENCOUNTER — Encounter: Payer: Self-pay | Admitting: Cardiology

## 2022-06-24 VITALS — BP 130/92 | HR 79 | Ht 71.5 in | Wt 203.8 lb

## 2022-06-24 DIAGNOSIS — E782 Mixed hyperlipidemia: Secondary | ICD-10-CM | POA: Diagnosis not present

## 2022-06-24 DIAGNOSIS — I10 Essential (primary) hypertension: Secondary | ICD-10-CM

## 2022-06-24 DIAGNOSIS — I2510 Atherosclerotic heart disease of native coronary artery without angina pectoris: Secondary | ICD-10-CM | POA: Diagnosis not present

## 2022-06-24 DIAGNOSIS — R0989 Other specified symptoms and signs involving the circulatory and respiratory systems: Secondary | ICD-10-CM | POA: Diagnosis not present

## 2022-06-24 MED ORDER — EZETIMIBE 10 MG PO TABS: 10.0000 mg | ORAL_TABLET | Freq: Every day | ORAL | 3 refills | Status: DC

## 2022-06-24 NOTE — Patient Instructions (Signed)
Medication Instructions:   START EZETIMIBE 10 MG ONCE DAILY  *If you need a refill on your cardiac medications before your next appointment, please call your pharmacy*   Lab Work:  Your physician recommends that you return for lab work in: [place]  If you have labs (blood work) drawn today and your tests are completely normal, you will receive your results only by: [place] (if you have MyChart) OR A paper copy in the mail If you have any lab test that is abnormal or we need to change your treatment, we will call you to review the results.   Testing/Procedures:  Your physician has requested that you have an abdominal aorta duplex. During this test, an ultrasound is used to evaluate the aorta. Allow 30 minutes for this exam. Do not eat after midnight the day before and avoid carbonated beverages [facility] [facility]   Follow-Up: At [facility], you and your health needs are our priority.  As part of our continuing mission to provide you with exceptional heart care, we have created designated Provider Care Teams.  These Care Teams include your primary Cardiologist (physician) and Advanced Practice Providers (APPs -  Physician Assistants and Nurse Practitioners) who all work together to provide you with the care you need, when you need it.  We recommend signing up for the patient portal called "MyChart".  Sign up information is provided on this After Visit Summary.  MyChart is used to connect with patients for Virtual Visits (Telemedicine).  Patients are able to view lab/test results, encounter notes, upcoming appointments, etc.  Non-urgent messages can be sent to your provider as well.   To learn more about what you can do with MyChart, go to [email or web address].    Your next appointment:   12 month(s)  The format for your next appointment:   In Person  Provider:   Kirk Ruths MD

## 2022-06-26 ENCOUNTER — Ambulatory Visit (HOSPITAL_COMMUNITY)
Admission: RE | Admit: 2022-06-26 | Discharge: 2022-06-26 | Disposition: A | Discharge status: Home / Self Care | Payer: Medicare HMO | Source: Ambulatory Visit | Attending: Cardiology | Admitting: Cardiology

## 2022-06-26 DIAGNOSIS — R0989 Other specified symptoms and signs involving the circulatory and respiratory systems: Secondary | ICD-10-CM | POA: Diagnosis not present

## 2022-06-26 DIAGNOSIS — Z8489 Family history of other specified conditions: Secondary | ICD-10-CM

## 2022-06-26 DIAGNOSIS — Z136 Encounter for screening for cardiovascular disorders: Secondary | ICD-10-CM | POA: Insufficient documentation

## 2022-07-08 ENCOUNTER — Ambulatory Visit: Payer: Medicare HMO

## 2022-07-10 ENCOUNTER — Ambulatory Visit (INDEPENDENT_AMBULATORY_CARE_PROVIDER_SITE_OTHER): Payer: Medicare HMO | Admitting: Internal Medicine

## 2022-07-10 ENCOUNTER — Encounter: Payer: Self-pay | Admitting: Internal Medicine

## 2022-07-10 ENCOUNTER — Telehealth: Payer: Self-pay

## 2022-07-10 VITALS — BP 132/78 | HR 67 | Temp 98.1°F | Ht 71.5 in | Wt 205.0 lb

## 2022-07-10 DIAGNOSIS — Z0001 Encounter for general adult medical examination with abnormal findings: Secondary | ICD-10-CM

## 2022-07-10 DIAGNOSIS — E538 Deficiency of other specified B group vitamins: Secondary | ICD-10-CM

## 2022-07-10 DIAGNOSIS — R739 Hyperglycemia, unspecified: Secondary | ICD-10-CM

## 2022-07-10 DIAGNOSIS — E559 Vitamin D deficiency, unspecified: Secondary | ICD-10-CM

## 2022-07-10 DIAGNOSIS — R69 Illness, unspecified: Secondary | ICD-10-CM | POA: Diagnosis not present

## 2022-07-10 DIAGNOSIS — Z125 Encounter for screening for malignant neoplasm of prostate: Secondary | ICD-10-CM

## 2022-07-10 DIAGNOSIS — E782 Mixed hyperlipidemia: Secondary | ICD-10-CM

## 2022-07-10 DIAGNOSIS — I10 Essential (primary) hypertension: Secondary | ICD-10-CM | POA: Diagnosis not present

## 2022-07-10 DIAGNOSIS — F411 Generalized anxiety disorder: Secondary | ICD-10-CM | POA: Diagnosis not present

## 2022-07-10 LAB — CBC WITH DIFFERENTIAL/PLATELET
Basophils Absolute: 0.1 10*3/uL (ref 0.0–0.1)
Basophils Relative: 1.4 % (ref 0.0–3.0)
Eosinophils Absolute: 0.3 10*3/uL (ref 0.0–0.7)
Eosinophils Relative: 3.6 % (ref 0.0–5.0)
HCT: 39.9 % (ref 39.0–52.0)
Hemoglobin: 13.4 g/dL (ref 13.0–17.0)
Lymphocytes Relative: 23 % (ref 12.0–46.0)
Lymphs Abs: 1.8 10*3/uL (ref 0.7–4.0)
MCHC: 33.5 g/dL (ref 30.0–36.0)
MCV: 88.5 fl (ref 78.0–100.0)
Monocytes Absolute: 1 10*3/uL (ref 0.1–1.0)
Monocytes Relative: 12.7 % — ABNORMAL HIGH (ref 3.0–12.0)
Neutro Abs: 4.7 10*3/uL (ref 1.4–7.7)
Neutrophils Relative %: 59.3 % (ref 43.0–77.0)
Platelets: 297 10*3/uL (ref 150.0–400.0)
RBC: 4.51 Mil/uL (ref 4.22–5.81)
RDW: 13.5 % (ref 11.5–15.5)
WBC: 7.9 10*3/uL (ref 4.0–10.5)

## 2022-07-10 LAB — URINALYSIS, ROUTINE W REFLEX MICROSCOPIC
Bilirubin Urine: NEGATIVE
Hgb urine dipstick: NEGATIVE
Ketones, ur: NEGATIVE
Leukocytes,Ua: NEGATIVE
Nitrite: NEGATIVE
RBC / HPF: NONE SEEN (ref 0–?)
Specific Gravity, Urine: 1.02 (ref 1.000–1.030)
Total Protein, Urine: NEGATIVE
Urine Glucose: NEGATIVE
Urobilinogen, UA: 0.2 (ref 0.0–1.0)
WBC, UA: NONE SEEN (ref 0–?)
pH: 6 (ref 5.0–8.0)

## 2022-07-10 LAB — BASIC METABOLIC PANEL
BUN: 11 mg/dL (ref 6–23)
CO2: 28 mEq/L (ref 19–32)
Calcium: 9.7 mg/dL (ref 8.4–10.5)
Chloride: 105 mEq/L (ref 96–112)
Creatinine, Ser: 1.23 mg/dL (ref 0.40–1.50)
GFR: 59.97 mL/min — ABNORMAL LOW (ref >60.00)
Glucose, Bld: 91 mg/dL (ref 70–99)
Potassium: 4.6 mEq/L (ref 3.5–5.1)
Sodium: 141 mEq/L (ref 135–145)

## 2022-07-10 LAB — PSA: PSA: 1.63 ng/mL (ref 0.10–4.00)

## 2022-07-10 LAB — VITAMIN B12: Vitamin B-12: 311 pg/mL (ref 211–911)

## 2022-07-10 LAB — HEPATIC FUNCTION PANEL
ALT: 48 U/L (ref 0–53)
AST: 38 U/L — ABNORMAL HIGH (ref 0–37)
Albumin: 4.2 g/dL (ref 3.5–5.2)
Alkaline Phosphatase: 64 U/L (ref 39–117)
Bilirubin, Direct: 0.1 mg/dL (ref 0.0–0.3)
Total Bilirubin: 0.5 mg/dL (ref 0.2–1.2)
Total Protein: 6.8 g/dL (ref 6.0–8.3)

## 2022-07-10 LAB — TSH: TSH: 0.92 u[IU]/mL (ref 0.35–5.50)

## 2022-07-10 LAB — VITAMIN D 25 HYDROXY (VIT D DEFICIENCY, FRACTURES): VITD: >120 ng/mL

## 2022-07-10 LAB — HEMOGLOBIN A1C: Hgb A1c MFr Bld: 5.6 % (ref 4.6–6.5)

## 2022-07-10 NOTE — Assessment & Plan Note (Signed)
BP Readings from Last 3 Encounters:  07/10/22 132/78  06/24/22 (!) 130/92  03/28/22 (!) 142/90   Stable, pt to continue medical treatment lopressor 25 bid, and losartan 50 qd

## 2022-07-10 NOTE — Assessment & Plan Note (Signed)
Lab Results  Component Value Date   HGBA1C 5.6 07/12/2021   Stable, pt to continue current medical treatment - diet, wt control, excercise

## 2022-07-10 NOTE — Assessment & Plan Note (Addendum)
Lab Results  Component Value Date   LDLCALC 123 (H) 04/04/2022   Uncontrolled, Pt now on zetia 10 mg  with f/u jan 2024 per cardiology, has been statin intolerant

## 2022-07-10 NOTE — Assessment & Plan Note (Signed)
Age and sex appropriate education and counseling updated with regular exercise and diet Referrals for preventative services - none needed Immunizations addressed - decliones covid booster, for shingrx at pharmacy Smoking counseling  - none needed Evidence for depression or other mood disorder - none significant Most recent labs reviewed. I have personally reviewed and have noted: 1) the patient's medical and social history 2) The patient's current medications and supplements 3) The patient's height, weight, and BMI have been recorded in the chart

## 2022-07-10 NOTE — Assessment & Plan Note (Signed)
Stable, for cont'd klonopin 0.5 bid prn

## 2022-07-10 NOTE — Progress Notes (Signed)
Patient ID: [name] Michael, male   DOB: Sep 18, 1950, 71 y.o.   MRN: [id number]         Chief Complaint:: wellness exam and hld, anxiety       HPI:  [name] Michael is a 71 y.o. male here for wellness exam; declines covid booster, will consider shingrix at the pharmacy, o/w up to date                        Also had worsening BP in October, CT showed no change in aneurysm, and started statin.  Has aopt for f/u lipiids jan 2024.  Denies worsening depressive symptoms, suicidal ideation, or panic; has ongoing anxiety.  Pt denies chest pain, increased sob or doe, wheezing, orthopnea, PND, increased LE swelling, palpitations, dizziness or syncope.   Pt denies polydipsia, polyuria, or new focal neuro s/s.    Pt denies fever, wt loss, night sweats, loss of appetite, or other constitutional symptoms.  Seems rheumatology on regular basis with labs.     Wt Readings from Last 3 Encounters:  07/10/22 205 lb (93 kg)  06/24/22 203 lb 12.8 oz (92.4 kg)  03/28/22 208 lb (94.3 kg)   BP Readings from Last 3 Encounters:  07/10/22 132/78  06/24/22 (!) 130/92  03/28/22 (!) 142/90   Immunization History  Administered Date(s) Administered   Fluad Quad(high Dose 65+) 04/18/2019, 07/11/2020   Influenza Split 05/18/2012, 04/05/2014   Influenza Whole 06/09/2007, 04/28/2013   Influenza, High Dose Seasonal PF 06/09/2021   Influenza,inj,Quad PF,6+ Mos 05/03/2017, 05/12/2018   Influenza-Unspecified 05/16/2016, 06/15/2022   Moderna Sars-Covid-2 Vaccination 09/17/2019, 10/15/2019, 04/17/2020, 12/20/2020   Pneumococcal Conjugate-13 05/26/2015   Pneumococcal Polysaccharide-23 04/07/2018   Td 02/17/2009   Tdap 04/22/2015   Zoster, Live 04/01/2013   There are no preventive care reminders to display for this patient.     Past Medical History:  Diagnosis Date   ALLERGIC RHINITIS 04/30/2007   Allergy    mild   ANEMIA-NOS 07/12/2007   ANXIETY 02/17/2009   Blood transfusion without reported diagnosis    bleeding  ulcers in 2008 or 2009   Cancer [facility]) 10/2019   lip cancer removed    Cataract    bilat removed    Clotting disorder ([place])    PE 2009- pt unaware of this finding    Coronary artery disease involving native coronary artery of native heart with unstable angina pectoris ([place]) 05/10/2011   DEPRESSIVE DISORDER 02/14/2009   Fibromyalgia    GERD (gastroesophageal reflux disease)    History of pulmonary embolism 03/09/2008   HYPERLIPIDEMIA 04/27/2007   HYPERTENSION 04/27/2007   INSOMNIA UNSPECIFIED 02/20/2010   Lupus ([place])    "w/RA" (08/20/2017)   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   Migraine    "stopped in the early 2000s" (08/20/2017)   Non-ST elevation (NSTEMI) myocardial infarction ([place]) -TYPE 4A peri-PCI 08/21/2017   Nonspecific abnormal toxicological findings 03/14/2008   NSTEMI (non-ST elevated myocardial infarction) ([place]) 08/20/2017   OBESITY 04/30/2007   OSTEOARTHRITIS 04/30/2007   PEPTIC ULCER DISEASE 07/12/2007   Presence of drug coated stent in left circumflex coronary artery 08/21/2017   PROSTATITIS, HX OF 04/30/2007   RESTRICTIVE LUNG DISEASE 06/20/2009   Rheumatoid arthritis ([place])    THORACIC AORTIC ANEURYSM 07/06/2009   Chest CT 11/12: ascending thoracic aortic aneurysm stable at 42 mm.    UNSPECIFIED MYALGIA AND MYOSITIS 02/14/2009   Past Surgical History:  Procedure Laterality Date   APPENDECTOMY     BACK SURGERY  2017  cervical discectomy C3-C4    CARDIAC CATHETERIZATION  06/2008   Non-obstructive CAD.  EF 40-45%. Anterior - apical HK   COLONOSCOPY  2010   CORONARY ANGIOPLASTY WITH STENT PLACEMENT  08/20/2017   CORONARY STENT INTERVENTION N/A 08/20/2017   Procedure: CORONARY STENT INTERVENTION;  Surgeon: [name], MD;  Location: [facility];  Service: Cardiovascular;  Laterality: N/A;   LEFT HEART CATH AND CORONARY ANGIOGRAPHY N/A 08/20/2017   Procedure: LEFT HEART CATH AND CORONARY ANGIOGRAPHY;  Surgeon: [name], MD;  Location: [facility];  Service: Cardiovascular;  Laterality: N/A;   NM MYOVIEW LTD  06/2009   Abnormal stress nuclear study with small prior lateral infarct; no ischemia. Normal EF   POLYPECTOMY  04/14/2009   TA x 1   SURGERY OF LIP  10/2019   TRANSTHORACIC ECHOCARDIOGRAM  06/2009   55% to 60%. Wall motion was normal.  Mod Dilated [place].  Mild LA dilation.   UPPER GASTROINTESTINAL ENDOSCOPY      reports that he has never smoked. He has never used smokeless tobacco. He reports that he does not drink alcohol and does not use drugs. family history includes Alcohol abuse in an other family member; Diabetes in his brother, brother, father, mother, and another family member; Heart disease in his brother, father, mother, and another family member; Hyperlipidemia in his father and mother; Hypertension in his brother, sister, and another family member; Stroke in his father, mother, and another family member. Allergies  Allergen Reactions   Atorvastatin     REACTION: myalgias   Ciprofloxacin Other (See Comments)    myalgias   Crestor [Rosuvastatin]     myalgias   Doxycycline    Current Outpatient Medications on File Prior to Visit  Medication Sig Dispense Refill   Ascorbic Acid (VITAMIN C PO) Take 1 tablet by mouth daily in the afternoon.     aspirin 81 MG EC tablet Take 1 tablet (81 mg total) by mouth daily. 30 tablet 11   B Complex-Folic Acid (B COMPLEX PLUS) TABS Take 1 tablet by mouth daily. 100 tablet 3   Cholecalciferol (VITAMIN D) 50 MCG (2000 UT) CAPS Take 2,000 Units by mouth daily in the afternoon.     clonazePAM (KLONOPIN) 1 MG tablet TAKE ONE TABLET BY MOUTH TWICE DAILY AS NEEDED FOR ANXIETY 60 tablet 5   ezetimibe (ZETIA) 10 MG tablet Take 1 tablet (10 mg total) by mouth daily. 90 tablet 3   folic acid (FOLVITE) 096 MCG tablet Take 400 mcg by mouth daily.     hydroxychloroquine (PLAQUENIL) 200 MG tablet Take 200 mg by mouth 2 (two) times daily.     leflunomide (ARAVA) 20 MG tablet Take 20 mg  by mouth daily.       losartan (COZAAR) 50 MG tablet Take 1 tablet (50 mg total) by mouth daily. 90 tablet 3   metoprolol tartrate (LOPRESSOR) 25 MG tablet TAKE ONE TABLET BY MOUTH TWICE DAILY 180 tablet 0   Omega-3 Fatty Acids (FISH OIL) 1200 MG CAPS Take 1,200 mg by mouth daily in the afternoon.     omeprazole (PRILOSEC) 20 MG capsule Take 20 mg by mouth daily.     sildenafil (VIAGRA) 100 MG tablet TAKE ONE TABLET BY MOUTH DAILY AS NEEDED 10 tablet 0   Turmeric 500 MG CAPS Take 500 mg by mouth daily in the afternoon.     zolpidem (AMBIEN) 10 MG tablet TAKE ONE TABLET BY MOUTH DAILY AT BEDTIME 90 tablet  1   No current facility-administered medications on file prior to visit.        ROS:  All others reviewed and negative.  Objective        PE:  BP 132/78 (BP Location: Left Arm, Patient Position: Sitting, Cuff Size: Large)   Pulse 67   Temp 98.1 F (36.7 C) (Oral)   Ht 5' 11.5" (1.816 m)   Wt 205 lb (93 kg)   SpO2 91%   BMI 28.19 kg/m                 Constitutional: Pt appears in NAD               HENT: Head: NCAT.                Right Ear: External ear normal.                 Left Ear: External ear normal.                Eyes: . Pupils are equal, round, and reactive to light. Conjunctivae and EOM are normal               Nose: without d/c or deformity               Neck: Neck supple. Gross normal ROM               Cardiovascular: Normal rate and regular rhythm.                 Pulmonary/Chest: Effort normal and breath sounds without rales or wheezing.                Abd:  Soft, NT, ND, + BS, no organomegaly               Neurological: Pt is alert. At baseline orientation, motor grossly intact               Skin: Skin is warm. No rashes, no other new lesions, LE edema - none               Psychiatric: Pt behavior is normal without agitation   Micro: none  Cardiac tracings I have personally interpreted today:  none  Pertinent Radiological findings (summarize): none   Lab  Results  Component Value Date   WBC 7.3 07/12/2021   HGB 13.4 07/12/2021   HCT 40.6 07/12/2021   PLT 265.0 07/12/2021   GLUCOSE 89 04/04/2022   CHOL 182 04/04/2022   TRIG 116 04/04/2022   HDL 38 (L) 04/04/2022   LDLDIRECT 131.0 04/03/2017   LDLCALC 123 (H) 04/04/2022   ALT 36 04/04/2022   AST 33 04/04/2022   NA 140 04/04/2022   K 4.5 04/04/2022   CL 101 04/04/2022   CREATININE 1.47 (H) 04/04/2022   BUN 18 04/04/2022   CO2 27 04/04/2022   TSH 1.41 07/12/2021   PSA 1.91 07/12/2021   INR 1.00 08/21/2017   HGBA1C 5.6 07/12/2021   Assessment/Plan:  Rishikesh Khachatryan is a 71 y.o. White or Caucasian [1] male with  has a past medical history of ALLERGIC RHINITIS (04/30/2007), Allergy, ANEMIA-NOS (07/12/2007), ANXIETY (02/17/2009), Blood transfusion without reported diagnosis, Cancer ([place]) (10/2019), Cataract, Clotting disorder ([place]), Coronary artery disease involving native coronary artery of native heart with unstable angina pectoris ([place]) (05/10/2011), DEPRESSIVE DISORDER (02/14/2009), Fibromyalgia, GERD (gastroesophageal reflux disease), History of pulmonary embolism (03/09/2008), HYPERLIPIDEMIA (04/27/2007), HYPERTENSION (04/27/2007), INSOMNIA UNSPECIFIED (02/20/2010), Lupus ([place]), MALLORY-WEISS SYNDROME (  04/27/2007), Migraine, Non-ST elevation (NSTEMI) myocardial infarction [facility]) -TYPE 4A peri-PCI (08/21/2017), Nonspecific abnormal toxicological findings (03/14/2008), NSTEMI (non-ST elevated myocardial infarction) ([place]) (08/20/2017), OBESITY (04/30/2007), OSTEOARTHRITIS (04/30/2007), PEPTIC ULCER DISEASE (07/12/2007), Presence of drug coated stent in left circumflex coronary artery (08/21/2017), PROSTATITIS, HX OF (04/30/2007), RESTRICTIVE LUNG DISEASE (06/20/2009), Rheumatoid arthritis ([place]), THORACIC AORTIC ANEURYSM (07/06/2009), and UNSPECIFIED MYALGIA AND MYOSITIS (02/14/2009).  Encounter for well adult exam with abnormal findings Age and sex appropriate education and counseling updated  with regular exercise and diet Referrals for preventative services - none needed Immunizations addressed - decliones covid booster, for shingrx at pharmacy Smoking counseling  - none needed Evidence for depression or other mood disorder - none significant Most recent labs reviewed. I have personally reviewed and have noted: 1) the patient's medical and social history 2) The patient's current medications and supplements 3) The patient's height, weight, and BMI have been recorded in the chart   B12 deficiency Lab Results  Component Value Date   VITAMINB12 423 07/12/2021   Stable, cont oral replacement - b12 1000 mcg qd   Generalized anxiety disorder Stable, for cont'd klonopin 0.5 bid prn  Essential hypertension BP Readings from Last 3 Encounters:  07/10/22 132/78  06/24/22 (!) 130/92  03/28/22 (!) 142/90   Stable, pt to continue medical treatment lopressor 25 bid, and losartan 50 qd   Hyperglycemia Lab Results  Component Value Date   HGBA1C 5.6 07/12/2021   Stable, pt to continue current medical treatment - diet, wt control, excercise   Hyperlipidemia Lab Results  Component Value Date   LDLCALC 123 (H) 04/04/2022   Uncontrolled, Pt now on zetia 10 mg  with f/u jan 2024 per cardiology, has been statin intolerant  Followup: Return in about 1 year (around 07/11/2023).  Cathlean Cower, MD 07/10/2022 12:50 PM [facility]

## 2022-07-10 NOTE — Assessment & Plan Note (Signed)
Lab Results  Component Value Date   VITAMINB12 423 07/12/2021   Stable, cont oral replacement - b12 1000 mcg qd

## 2022-07-10 NOTE — Patient Instructions (Addendum)
Please have your Shingrix (shingles) shots done at your local pharmacy.  Please continue all other medications as before, and refills have been done if requested.  Please have the pharmacy call with any other refills you may need.  Please continue your efforts at being more active, low cholesterol diet, and weight control.  You are otherwise up to date with prevention measures today.  Please keep your appointments with your specialists as you may have planned - the cholesterol level in January, and Dr Stanford Breed next year  Please go to the LAB at the blood drawing area for the tests to be done  You will be contacted by phone if any changes need to be made immediately.  Otherwise, you will receive a letter about your results with an explanation, but please check with MyChart first.  Please remember to sign up for MyChart if you have not done so, as this will be important to you in the future with finding out test results, communicating by private email, and scheduling acute appointments online when needed.  Please make an Appointment to return for your 1 year visit, or sooner if needed

## 2022-07-18 DIAGNOSIS — M5030 Other cervical disc degeneration, unspecified cervical region: Secondary | ICD-10-CM | POA: Diagnosis not present

## 2022-07-18 DIAGNOSIS — M797 Fibromyalgia: Secondary | ICD-10-CM | POA: Diagnosis not present

## 2022-07-18 DIAGNOSIS — M1991 Primary osteoarthritis, unspecified site: Secondary | ICD-10-CM | POA: Diagnosis not present

## 2022-07-18 DIAGNOSIS — Z6829 Body mass index (BMI) 29.0-29.9, adult: Secondary | ICD-10-CM | POA: Diagnosis not present

## 2022-07-18 DIAGNOSIS — M0589 Other rheumatoid arthritis with rheumatoid factor of multiple sites: Secondary | ICD-10-CM | POA: Diagnosis not present

## 2022-07-18 DIAGNOSIS — E663 Overweight: Secondary | ICD-10-CM | POA: Diagnosis not present

## 2022-07-22 ENCOUNTER — Other Ambulatory Visit: Payer: Self-pay | Admitting: Internal Medicine

## 2022-07-22 DIAGNOSIS — I10 Essential (primary) hypertension: Secondary | ICD-10-CM

## 2022-07-22 DIAGNOSIS — I252 Old myocardial infarction: Secondary | ICD-10-CM

## 2022-08-20 DIAGNOSIS — E782 Mixed hyperlipidemia: Secondary | ICD-10-CM | POA: Diagnosis not present

## 2022-08-20 LAB — HEPATIC FUNCTION PANEL
ALT: 54 IU/L — ABNORMAL HIGH (ref 0–44)
AST: 39 IU/L (ref 0–40)
Albumin: 4.2 g/dL (ref 3.9–4.9)
Alkaline Phosphatase: 77 IU/L (ref 44–121)
Bilirubin Total: 0.5 mg/dL (ref 0.0–1.2)
Bilirubin, Direct: 0.15 mg/dL (ref 0.00–0.40)
Total Protein: 6.5 g/dL (ref 6.0–8.5)

## 2022-08-20 LAB — LIPID PANEL
Chol/HDL Ratio: 3.8 ratio (ref 0.0–5.0)
Cholesterol, Total: 150 mg/dL (ref 100–199)
HDL: 40 mg/dL (ref >39)
LDL Chol Calc (NIH): 93 mg/dL (ref 0–99)
Triglycerides: 90 mg/dL (ref 0–149)
VLDL Cholesterol Cal: 17 mg/dL (ref 5–40)

## 2022-08-21 ENCOUNTER — Other Ambulatory Visit: Payer: Self-pay

## 2022-08-21 ENCOUNTER — Other Ambulatory Visit: Payer: Self-pay | Admitting: Internal Medicine

## 2022-08-21 NOTE — Telephone Encounter (Signed)
Please refill as per office routine med refill policy (all routine meds to be refilled for 3 mo or monthly (per pt preference) up to one year from last visit, then month to month grace period for 3 mo, then further med refills will have to be denied)

## 2022-08-23 ENCOUNTER — Other Ambulatory Visit: Payer: Self-pay | Admitting: *Deleted

## 2022-08-23 ENCOUNTER — Encounter: Payer: Self-pay | Admitting: *Deleted

## 2022-08-23 DIAGNOSIS — I10 Essential (primary) hypertension: Secondary | ICD-10-CM

## 2022-08-23 MED ORDER — LOSARTAN POTASSIUM 100 MG PO TABS: 100.0000 mg | ORAL_TABLET | Freq: Every day | ORAL | 3 refills | Status: DC

## 2022-09-02 ENCOUNTER — Other Ambulatory Visit: Payer: Self-pay | Admitting: Internal Medicine

## 2022-09-17 ENCOUNTER — Other Ambulatory Visit: Payer: Self-pay | Admitting: Internal Medicine

## 2022-10-03 NOTE — Telephone Encounter (Signed)
Taken care of already

## 2022-10-17 DIAGNOSIS — M0589 Other rheumatoid arthritis with rheumatoid factor of multiple sites: Secondary | ICD-10-CM | POA: Diagnosis not present

## 2022-10-22 DIAGNOSIS — D225 Melanocytic nevi of trunk: Secondary | ICD-10-CM | POA: Diagnosis not present

## 2022-10-22 DIAGNOSIS — L570 Actinic keratosis: Secondary | ICD-10-CM | POA: Diagnosis not present

## 2022-10-22 DIAGNOSIS — L821 Other seborrheic keratosis: Secondary | ICD-10-CM | POA: Diagnosis not present

## 2022-10-22 DIAGNOSIS — L02425 Furuncle of right lower limb: Secondary | ICD-10-CM | POA: Diagnosis not present

## 2022-10-22 DIAGNOSIS — Z85828 Personal history of other malignant neoplasm of skin: Secondary | ICD-10-CM | POA: Diagnosis not present

## 2022-10-22 DIAGNOSIS — L814 Other melanin hyperpigmentation: Secondary | ICD-10-CM | POA: Diagnosis not present

## 2022-11-12 ENCOUNTER — Other Ambulatory Visit: Payer: Self-pay | Admitting: Internal Medicine

## 2022-11-15 ENCOUNTER — Other Ambulatory Visit: Payer: Self-pay | Admitting: Internal Medicine

## 2023-01-16 ENCOUNTER — Ambulatory Visit (INDEPENDENT_AMBULATORY_CARE_PROVIDER_SITE_OTHER): Payer: Medicare HMO

## 2023-01-16 VITALS — Ht 71.5 in | Wt 196.4 lb

## 2023-01-16 DIAGNOSIS — Z0000 Encounter for general adult medical examination without abnormal findings: Secondary | ICD-10-CM | POA: Diagnosis not present

## 2023-01-16 NOTE — Patient Instructions (Addendum)
[name] Michael , Thank you for taking time to come for your Medicare Wellness Visit. I appreciate your ongoing commitment to your health goals. Please review the following plan we discussed and let me know if I can assist you in the future.   These are the goals we discussed:  Goals       No current goals (pt-stated)        This is a list of the screening recommended for you and due dates:  Health Maintenance  Topic Date Due   COVID-19 Vaccine (5 - 2023-24 season) 02/01/2023*   Zoster (Shingles) Vaccine (1 of 2) 04/18/2023*   Flu Shot  03/06/2023   Colon Cancer Screening  10/07/2023   Medicare Annual Wellness Visit  01/16/2024   DTaP/Tdap/Td vaccine (3 - Td or Tdap) 04/21/2025   Pneumonia Vaccine  Completed   Hepatitis C Screening  Completed   HPV Vaccine  Aged Out  *Topic was postponed. The date shown is not the original due date.    Advanced directives: Please bring a copy of your health care power of attorney and living will to the office to be added to your chart at your convenience.   Conditions/risks identified: None  Next appointment: Follow up in one year for your annual wellness visit.    Preventive Care 36 Years and Older, Male  Preventive care refers to lifestyle choices and visits with your health care provider that can promote health and wellness. What does preventive care include? A yearly physical exam. This is also called an annual well check. Dental exams once or twice a year. Routine eye exams. Ask your health care provider how often you should have your eyes checked. Personal lifestyle choices, including: Daily care of your teeth and gums. Regular physical activity. Eating a healthy diet. Avoiding tobacco and drug use. Limiting alcohol use. Practicing safe sex. Taking low doses of aspirin every day. Taking vitamin and mineral supplements as recommended by your health care provider. What happens during an annual well check? The services and screenings  done by your health care provider during your annual well check will depend on your age, overall health, lifestyle risk factors, and family history of disease. Counseling  Your health care provider may ask you questions about your: Alcohol use. Tobacco use. Drug use. Emotional well-being. Home and relationship well-being. Sexual activity. Eating habits. History of falls. Memory and ability to understand (cognition). Work and work Astronomer. Screening  You may have the following tests or measurements: Height, weight, and BMI. Blood pressure. Lipid and cholesterol levels. These may be checked every 5 years, or more frequently if you are over 26 years old. Skin check. Lung cancer screening. You may have this screening every year starting at age 37 if you have a 30-pack-year history of smoking and currently smoke or have quit within the past 15 years. Fecal occult blood test (FOBT) of the stool. You may have this test every year starting at age 71. Flexible sigmoidoscopy or colonoscopy. You may have a sigmoidoscopy every 5 years or a colonoscopy every 10 years starting at age 107. Prostate cancer screening. Recommendations will vary depending on your family history and other risks. Hepatitis C blood test. Hepatitis B blood test. Sexually transmitted disease (STD) testing. Diabetes screening. This is done by checking your blood sugar (glucose) after you have not eaten for a while (fasting). You may have this done every 1-3 years. Abdominal aortic aneurysm (AAA) screening. You may need this if you are a current  or former smoker. Osteoporosis. You may be screened starting at age 12 if you are at high risk. Talk with your health care provider about your test results, treatment options, and if necessary, the need for more tests. Vaccines  Your health care provider may recommend certain vaccines, such as: Influenza vaccine. This is recommended every year. Tetanus, diphtheria, and acellular  pertussis (Tdap, Td) vaccine. You may need a Td booster every 10 years. Zoster vaccine. You may need this after age 68. Pneumococcal 13-valent conjugate (PCV13) vaccine. One dose is recommended after age 74. Pneumococcal polysaccharide (PPSV23) vaccine. One dose is recommended after age 84. Talk to your health care provider about which screenings and vaccines you need and how often you need them. This information is not intended to replace advice given to you by your health care provider. Make sure you discuss any questions you have with your health care provider. Document Released: 08/18/2015 Document Revised: 04/10/2016 Document Reviewed: 05/23/2015 Elsevier Interactive Patient Education  2017 [place] Prevention in the Home Falls can cause injuries. They can happen to people of all ages. There are many things you can do to make your home safe and to help prevent falls. What can I do on the outside of my home? Regularly fix the edges of walkways and driveways and fix any cracks. Remove anything that might make you trip as you walk through a door, such as a raised step or threshold. Trim any bushes or trees on the path to your home. Use bright outdoor lighting. Clear any walking paths of anything that might make someone trip, such as rocks or tools. Regularly check to see if handrails are loose or broken. Make sure that both sides of any steps have handrails. Any raised decks and porches should have guardrails on the edges. Have any leaves, snow, or ice cleared regularly. Use sand or salt on walking paths during winter. Clean up any spills in your garage right away. This includes oil or grease spills. What can I do in the bathroom? Use night lights. Install grab bars by the toilet and in the tub and shower. Do not use towel bars as grab bars. Use non-skid mats or decals in the tub or shower. If you need to sit down in the shower, use a plastic, non-slip stool. Keep the floor  dry. Clean up any water that spills on the floor as soon as it happens. Remove soap buildup in the tub or shower regularly. Attach bath mats securely with double-sided non-slip rug tape. Do not have throw rugs and other things on the floor that can make you trip. What can I do in the bedroom? Use night lights. Make sure that you have a light by your bed that is easy to reach. Do not use any sheets or blankets that are too big for your bed. They should not hang down onto the floor. Have a firm chair that has side arms. You can use this for support while you get dressed. Do not have throw rugs and other things on the floor that can make you trip. What can I do in the kitchen? Clean up any spills right away. Avoid walking on wet floors. Keep items that you use a lot in easy-to-reach places. If you need to reach something above you, use a strong step stool that has a grab bar. Keep electrical cords out of the way. Do not use floor polish or wax that makes floors slippery. If you must use wax,  use non-skid floor wax. Do not have throw rugs and other things on the floor that can make you trip. What can I do with my stairs? Do not leave any items on the stairs. Make sure that there are handrails on both sides of the stairs and use them. Fix handrails that are broken or loose. Make sure that handrails are as long as the stairways. Check any carpeting to make sure that it is firmly attached to the stairs. Fix any carpet that is loose or worn. Avoid having throw rugs at the top or bottom of the stairs. If you do have throw rugs, attach them to the floor with carpet tape. Make sure that you have a light switch at the top of the stairs and the bottom of the stairs. If you do not have them, ask someone to add them for you. What else can I do to help prevent falls? Wear shoes that: Do not have high heels. Have rubber bottoms. Are comfortable and fit you well. Are closed at the toe. Do not wear  sandals. If you use a stepladder: Make sure that it is fully opened. Do not climb a closed stepladder. Make sure that both sides of the stepladder are locked into place. Ask someone to hold it for you, if possible. Clearly mark and make sure that you can see: Any grab bars or handrails. First and last steps. Where the edge of each step is. Use tools that help you move around (mobility aids) if they are needed. These include: Canes. Walkers. Scooters. Crutches. Turn on the lights when you go into a dark area. Replace any light bulbs as soon as they burn out. Set up your furniture so you have a clear path. Avoid moving your furniture around. If any of your floors are uneven, fix them. If there are any pets around you, be aware of where they are. Review your medicines with your doctor. Some medicines can make you feel dizzy. This can increase your chance of falling. Ask your doctor what other things that you can do to help prevent falls. This information is not intended to replace advice given to you by your health care provider. Make sure you discuss any questions you have with your health care provider. Document Released: 05/18/2009 Document Revised: 12/28/2015 Document Reviewed: 08/26/2014 Elsevier Interactive Patient Education  2017 [employer].

## 2023-01-16 NOTE — Progress Notes (Signed)
Subjective:   Ka Polishchuk is a 72 y.o. male who presents for Medicare Annual/Subsequent preventive examination.  Review of Systems    Virtual Visit via Telephone Note  I connected with  Milez Lagueux on 01/16/23 at  9:15 AM EDT by telephone and verified that I am speaking with the correct person using two identifiers.  Location: Patient: Home Provider: Office Persons participating in the virtual visit: patient/Nurse Health Advisor   I discussed the limitations, risks, security and privacy concerns of performing an evaluation and management service by telephone and the availability of in person appointments. The patient expressed understanding and agreed to proceed.  Interactive audio and video telecommunications were attempted between this nurse and patient, however failed, due to patient having technical difficulties OR patient did not have access to video capability.  We continued and completed visit with audio only.  Some vital signs may be absent or patient reported.   Tillie Rung, LPN  Cardiac Risk Factors include: advanced age (>48men, >4 women);male gender;hypertension     Objective:    Today's Vitals   01/16/23 0922  Weight: 196 lb 6.4 oz (89.1 kg)  Height: 5' 11.5" (1.816 m)   Body mass index is 27.01 kg/m.     01/16/2023    9:30 AM 08/20/2017   10:17 PM  Advanced Directives  Does Patient Have a Medical Advance Directive? Yes Yes  Type of Estate agent of [place];Living will Living will  Does patient want to make changes to medical advance directive?  No - Patient declined  Copy of Healthcare Power of Attorney in Chart? No - copy requested     Current Medications (verified) Outpatient Encounter Medications as of 01/16/2023  Medication Sig   Ascorbic Acid (VITAMIN C PO) Take 1 tablet by mouth daily in the afternoon.   aspirin 81 MG EC tablet Take 1 tablet (81 mg total) by mouth daily.   B Complex-Folic Acid (B COMPLEX PLUS)  TABS Take 1 tablet by mouth daily.   Cholecalciferol (VITAMIN D) 50 MCG (2000 UT) CAPS Take 2,000 Units by mouth daily in the afternoon.   clonazePAM (KLONOPIN) 1 MG tablet TAKE ONE TABLET BY MOUTH TWICE DAILY AS NEEDED FOR ANXIETY   ezetimibe (ZETIA) 10 MG tablet Take 1 tablet (10 mg total) by mouth daily.   folic acid (FOLVITE) 800 MCG tablet Take 400 mcg by mouth daily.   hydroxychloroquine (PLAQUENIL) 200 MG tablet Take 200 mg by mouth 2 (two) times daily.   leflunomide (ARAVA) 20 MG tablet Take 20 mg by mouth daily.     metoprolol tartrate (LOPRESSOR) 25 MG tablet TAKE ONE TABLET BY MOUTH TWICE DAILY   Omega-3 Fatty Acids (FISH OIL) 1200 MG CAPS Take 1,200 mg by mouth daily in the afternoon.   omeprazole (PRILOSEC) 20 MG capsule TAKE ONE CAPSULE BY MOUTH ONE TIME DAILY   sildenafil (VIAGRA) 100 MG tablet TAKE ONE TABLET BY MOUTH DAILY AS NEEDED   Turmeric 500 MG CAPS Take 500 mg by mouth daily in the afternoon.   zolpidem (AMBIEN) 10 MG tablet TAKE ONE TABLET BY MOUTH DAILY AT BEDTIME   [DISCONTINUED] losartan (COZAAR) 100 MG tablet Take 1 tablet (100 mg total) by mouth daily.   No facility-administered encounter medications on file as of 01/16/2023.    Allergies (verified) Atorvastatin, Ciprofloxacin, Crestor [rosuvastatin], and Doxycycline   History: Past Medical History:  Diagnosis Date   ALLERGIC RHINITIS 04/30/2007   Allergy    mild   ANEMIA-NOS 07/12/2007  ANXIETY 02/17/2009   Blood transfusion without reported diagnosis    bleeding ulcers in 2008 or 2009   Cancer 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   Migraine    "stopped in the early 2000s" (08/20/2017)   Non-ST elevation (NSTEMI) myocardial infarction (HCC) -TYPE 4A peri-PCI 08/21/2017   Nonspecific abnormal toxicological findings 03/14/2008   NSTEMI (non-ST elevated myocardial infarction) (HCC) 08/20/2017   OBESITY 04/30/2007   OSTEOARTHRITIS 04/30/2007   PEPTIC ULCER DISEASE 07/12/2007   Presence of drug coated stent in left circumflex coronary artery 08/21/2017   PROSTATITIS, HX OF 04/30/2007   RESTRICTIVE LUNG DISEASE 06/20/2009   Rheumatoid arthritis (HCC)    THORACIC AORTIC ANEURYSM 07/06/2009   Chest CT 11/12: ascending thoracic aortic aneurysm stable at 42 mm.    UNSPECIFIED MYALGIA AND MYOSITIS 02/14/2009   Past Surgical History:  Procedure Laterality Date   APPENDECTOMY     BACK SURGERY  2017   cervical discectomy C3-C4    CARDIAC CATHETERIZATION  06/2008   Non-obstructive CAD.  EF 40-45%. Anterior - apical HK   COLONOSCOPY  2010   CORONARY ANGIOPLASTY WITH STENT PLACEMENT  08/20/2017   CORONARY STENT INTERVENTION N/A 08/20/2017   Procedure: CORONARY STENT INTERVENTION;  Surgeon: Tonny Bollman, MD;  Location: [facility] INVASIVE CV LAB;  Service: Cardiovascular;  Laterality: N/A;   LEFT HEART CATH AND CORONARY ANGIOGRAPHY N/A 08/20/2017   Procedure: LEFT HEART CATH AND CORONARY ANGIOGRAPHY;  Surgeon: Tonny Bollman, MD;  Location: [facility] INVASIVE CV LAB;  Service: Cardiovascular;  Laterality: N/A;   NM MYOVIEW LTD  06/2009   Abnormal stress nuclear study with small prior lateral infarct; no ischemia. Normal EF   POLYPECTOMY  04/14/2009   TA x 1   SURGERY OF LIP  10/2019   TRANSTHORACIC ECHOCARDIOGRAM  06/2009   55% to 60%. Wall motion was normal.  Mod Dilated Aoi Root.  Mild LA dilation.   UPPER GASTROINTESTINAL ENDOSCOPY     Family History  Problem Relation Age of Onset    Diabetes Mother    Heart disease Mother    Hyperlipidemia Mother    Stroke Mother    Diabetes Father    Heart disease Father    Hyperlipidemia Father    Stroke Father    Hypertension Sister    Diabetes Brother    Diabetes Brother    Heart disease Brother    Hypertension Brother    Alcohol abuse Other    Diabetes Other    Hypertension Other    Heart disease Other    Stroke Other    Colon cancer Neg Hx    Colon polyps Neg Hx    Esophageal cancer Neg Hx    Stomach cancer Neg Hx    Rectal cancer Neg Hx    Social History   Socioeconomic History   Marital status: Married    Spouse name: Not on file   Number of children: Not on file  Years of education: Not on file   Highest education level: Not on file  Occupational History   Not on file  Tobacco Use   Smoking status: Never   Smokeless tobacco: Never  Vaping Use   Vaping Use: Never used  Substance and Sexual Activity   Alcohol use: No   Drug use: No   Sexual activity: Yes  Other Topics Concern   Not on file  Social History Narrative   Not on file   Social Determinants of Health   Financial Resource Strain: Low Risk  (01/16/2023)   Overall Financial Resource Strain (CARDIA)    Difficulty of Paying Living Expenses: Not hard at all  Food Insecurity: No Food Insecurity (01/16/2023)   Hunger Vital Sign    Worried About Running Out of Food in the Last Year: Never true    Ran Out of Food in the Last Year: Never true  Transportation Needs: No Transportation Needs (01/16/2023)   PRAPARE - Administrator, Civil Service (Medical): No    Lack of Transportation (Non-Medical): No  Physical Activity: Insufficiently Active (01/16/2023)   Exercise Vital Sign    Days of Exercise per Week: 5 days    Minutes of Exercise per Session: 20 min  Stress: No Stress Concern Present (01/16/2023)   [employer] of Occupational Health - Occupational Stress Questionnaire    Feeling of Stress : Not at all  Social Connections:  Moderately Isolated (01/16/2023)   Social Connection and Isolation Panel [NHANES]    Frequency of Communication with Friends and Family: More than three times a week    Frequency of Social Gatherings with Friends and Family: More than three times a week    Attends Religious Services: Never    Database administrator or Organizations: No    Attends Engineer, structural: Never    Marital Status: Married    Tobacco Counseling Counseling given: Not Answered   Clinical Intake:  Pre-visit preparation completed: No  Pain : No/denies pain     BMI - recorded: 27.01 Nutritional Status: BMI 25 -29 Overweight Nutritional Risks: None Diabetes: No  How often do you need to have someone help you when you read instructions, pamphlets, or other written materials from your doctor or pharmacy?: 1 - Never  Diabetic?  No  Interpreter Needed?: No      Activities of Daily Living    01/16/2023    9:29 AM  In your present state of health, do you have any difficulty performing the following activities:  Hearing? 0  Vision? 0  Difficulty concentrating or making decisions? 0  Walking or climbing stairs? 0  Dressing or bathing? 0  Doing errands, shopping? 0  Preparing Food and eating ? N  Using the Toilet? N  In the past six months, have you accidently leaked urine? N  Do you have problems with loss of bowel control? N  Managing your Medications? N  Managing your Finances? N  Housekeeping or managing your Housekeeping? N    Patient Care Team: Corwin Levins, MD as PCP - General (Internal Medicine) Jens Som Madolyn Frieze, MD as PCP - Cardiology (Cardiology)  Indicate any recent Medical Services you may have received from other than [facility] providers in the past year (date may be approximate).     Assessment:   This is a routine wellness examination for Dayvin.  Hearing/Vision screen Hearing Screening - Comments:: Denies hearing difficulties   Vision Screening - Comments:: Wears rx  glasses -  up to date with routine eye exams with  [facility]  Dietary issues and exercise activities discussed: Current Exercise Habits: Home exercise routine, Type of exercise: walking, Time (Minutes): 20, Frequency (Times/Week): 5, Weekly Exercise (Minutes/Week): 100, Intensity: Moderate, Exercise limited by: None identified   Goals Addressed               This Visit's Progress     No current goals (pt-stated)         Depression Screen    01/16/2023    9:28 AM 07/12/2021    8:39 AM 07/12/2021    8:20 AM 07/11/2020    8:57 AM 07/11/2020    8:18 AM 07/08/2019    8:57 AM 08/25/2017    2:12 PM  PHQ 2/9 Scores  PHQ - 2 Score 0 0 0 0 0 0 0  PHQ- 9 Score       1    Fall Risk    01/16/2023    9:30 AM 07/12/2021    8:39 AM 07/12/2021    8:20 AM 07/11/2020    8:57 AM 07/11/2020    8:18 AM  Fall Risk   Falls in the past year? 0 0 0 0 0  Number falls in past yr: 0 0 0  0  Injury with Fall? 0 0 0  0  Risk for fall due to : No Fall Risks    No Fall Risks  Follow up Falls prevention discussed    Falls evaluation completed    FALL RISK PREVENTION PERTAINING TO THE HOME:  Any stairs in or around the home? No  If so, are there any without handrails? No  Home free of loose throw rugs in walkways, pet beds, electrical cords, etc? Yes  Adequate lighting in your home to reduce risk of falls? Yes   ASSISTIVE DEVICES UTILIZED TO PREVENT FALLS:  Life alert? No  Use of a cane, walker or w/c? No  Grab bars in the bathroom? Yes Shower chair or bench in shower? Yes Elevated toilet seat or a handicapped toilet? No   TIMED UP AND GO:  Was the test performed? No . Audio Visit   Cognitive Function:        01/16/2023    9:31 AM  6CIT Screen  What Year? 0 points  What month? 0 points  What time? 0 points  Count back from 20 0 points  Months in reverse 0 points  Repeat phrase 0 points  Total Score 0 points    Immunizations Immunization History  Administered Date(s) Administered    Fluad Quad(high Dose 65+) 04/18/2019, 07/11/2020   Influenza Split 05/18/2012, 04/05/2014   Influenza Whole 06/09/2007, 04/28/2013   Influenza, High Dose Seasonal PF 06/09/2021   Influenza,inj,Quad PF,6+ Mos 05/03/2017, 05/12/2018   Influenza-Unspecified 05/16/2016, 06/15/2022   Moderna Sars-Covid-2 Vaccination 09/17/2019, 10/15/2019, 04/17/2020, 12/20/2020   Pneumococcal Conjugate-13 05/26/2015   Pneumococcal Polysaccharide-23 04/07/2018   Td 02/17/2009   Tdap 04/22/2015   Zoster, Live 04/01/2013    TDAP status: Up to date  Flu Vaccine status: Up to date  Pneumococcal vaccine status: Up to date  Covid-19 vaccine status: Completed vaccines  Qualifies for Shingles Vaccine? Yes   Zostavax completed No   Shingrix Completed?: No.    Education has been provided regarding the importance of this vaccine. Patient has been advised to call insurance company to determine out of pocket expense if they have not yet received this vaccine. Advised may also receive vaccine at local pharmacy or Health  Dept. Verbalized acceptance and understanding.  Screening Tests Health Maintenance  Topic Date Due   COVID-19 Vaccine (5 - 2023-24 season) 02/01/2023 (Originally 04/05/2022)   Zoster Vaccines- Shingrix (1 of 2) 04/18/2023 (Originally 03/08/2003)   INFLUENZA VACCINE  03/06/2023   Colonoscopy  10/07/2023   Medicare Annual Wellness (AWV)  01/16/2024   DTaP/Tdap/Td (3 - Td or Tdap) 04/21/2025   Pneumonia Vaccine 26+ Years old  Completed   Hepatitis C Screening  Completed   HPV VACCINES  Aged Out    Health Maintenance  There are no preventive care reminders to display for this patient.   Colorectal cancer screening: Type of screening: Colonoscopy. Completed 10/06/20. Repeat every 3 years  Lung Cancer Screening: (Low Dose CT Chest recommended if Age 13-80 years, 30 pack-year currently smoking OR have quit w/in 15years.) does not qualify.     Additional Screening:  Hepatitis C Screening: does  qualify; Completed 07/08/19  Vision Screening: Recommended annual ophthalmology exams for early detection of glaucoma and other disorders of the eye. Is the patient up to date with their annual eye exam?  Yes  Who is the provider or what is the name of the office in which the patient attends annual eye exams? [facility] If pt is not established with a provider, would they like to be referred to a provider to establish care? No .   Dental Screening: Recommended annual dental exams for proper oral hygiene  Community Resource Referral / Chronic Care Management:  CRR required this visit?  No   CCM required this visit?  No      Plan:     I have personally reviewed and noted the following in the patient's chart:   Medical and social history Use of alcohol, tobacco or illicit drugs  Current medications and supplements including opioid prescriptions. Patient is not currently taking opioid prescriptions. Functional ability and status Nutritional status Physical activity Advanced directives List of other physicians Hospitalizations, surgeries, and ER visits in previous 12 months Vitals Screenings to include cognitive, depression, and falls Referrals and appointments  In addition, I have reviewed and discussed with patient certain preventive protocols, quality metrics, and best practice recommendations. A written personalized care plan for preventive services as well as general preventive health recommendations were provided to patient.     Tillie Rung, LPN   11/11/8117   Nurse Notes:   None

## 2023-01-20 ENCOUNTER — Other Ambulatory Visit: Payer: Self-pay | Admitting: Internal Medicine

## 2023-01-23 DIAGNOSIS — M1991 Primary osteoarthritis, unspecified site: Secondary | ICD-10-CM | POA: Diagnosis not present

## 2023-01-23 DIAGNOSIS — M5030 Other cervical disc degeneration, unspecified cervical region: Secondary | ICD-10-CM | POA: Diagnosis not present

## 2023-01-23 DIAGNOSIS — Z6829 Body mass index (BMI) 29.0-29.9, adult: Secondary | ICD-10-CM | POA: Diagnosis not present

## 2023-01-23 DIAGNOSIS — E663 Overweight: Secondary | ICD-10-CM | POA: Diagnosis not present

## 2023-01-23 DIAGNOSIS — M0589 Other rheumatoid arthritis with rheumatoid factor of multiple sites: Secondary | ICD-10-CM | POA: Diagnosis not present

## 2023-01-23 DIAGNOSIS — M797 Fibromyalgia: Secondary | ICD-10-CM | POA: Diagnosis not present

## 2023-02-16 ENCOUNTER — Other Ambulatory Visit: Payer: Self-pay | Admitting: Internal Medicine

## 2023-02-17 ENCOUNTER — Other Ambulatory Visit: Payer: Self-pay

## 2023-02-17 MED ORDER — OMEPRAZOLE 20 MG PO CPDR: 20.0000 mg | DELAYED_RELEASE_CAPSULE | Freq: Every day | ORAL | 1 refills | Status: DC

## 2023-03-25 ENCOUNTER — Other Ambulatory Visit: Payer: Self-pay | Admitting: *Deleted

## 2023-03-25 DIAGNOSIS — I712 Thoracic aortic aneurysm, without rupture, unspecified: Secondary | ICD-10-CM

## 2023-04-21 ENCOUNTER — Other Ambulatory Visit: Payer: Self-pay | Admitting: Internal Medicine

## 2023-04-24 DIAGNOSIS — M0589 Other rheumatoid arthritis with rheumatoid factor of multiple sites: Secondary | ICD-10-CM | POA: Diagnosis not present

## 2023-04-24 DIAGNOSIS — Z79899 Other long term (current) drug therapy: Secondary | ICD-10-CM | POA: Diagnosis not present

## 2023-05-31 ENCOUNTER — Other Ambulatory Visit: Payer: Self-pay | Admitting: Cardiology

## 2023-05-31 DIAGNOSIS — E782 Mixed hyperlipidemia: Secondary | ICD-10-CM

## 2023-06-30 ENCOUNTER — Other Ambulatory Visit: Payer: Self-pay | Admitting: Cardiology

## 2023-06-30 DIAGNOSIS — E782 Mixed hyperlipidemia: Secondary | ICD-10-CM

## 2023-07-06 ENCOUNTER — Other Ambulatory Visit: Payer: Self-pay | Admitting: Internal Medicine

## 2023-07-06 DIAGNOSIS — I10 Essential (primary) hypertension: Secondary | ICD-10-CM

## 2023-07-06 DIAGNOSIS — I252 Old myocardial infarction: Secondary | ICD-10-CM

## 2023-07-07 ENCOUNTER — Other Ambulatory Visit: Payer: Self-pay

## 2023-07-08 ENCOUNTER — Other Ambulatory Visit: Payer: Self-pay | Admitting: Cardiology

## 2023-07-08 DIAGNOSIS — E782 Mixed hyperlipidemia: Secondary | ICD-10-CM

## 2023-07-14 ENCOUNTER — Ambulatory Visit: Payer: Medicare HMO | Admitting: Internal Medicine

## 2023-07-14 ENCOUNTER — Telehealth: Payer: Self-pay

## 2023-07-14 ENCOUNTER — Encounter: Payer: Self-pay | Admitting: Internal Medicine

## 2023-07-14 VITALS — BP 130/82 | HR 65 | Temp 98.2°F | Ht 71.5 in | Wt 207.0 lb

## 2023-07-14 DIAGNOSIS — E538 Deficiency of other specified B group vitamins: Secondary | ICD-10-CM

## 2023-07-14 DIAGNOSIS — I10 Essential (primary) hypertension: Secondary | ICD-10-CM

## 2023-07-14 DIAGNOSIS — E782 Mixed hyperlipidemia: Secondary | ICD-10-CM | POA: Diagnosis not present

## 2023-07-14 DIAGNOSIS — Z0001 Encounter for general adult medical examination with abnormal findings: Secondary | ICD-10-CM | POA: Diagnosis not present

## 2023-07-14 DIAGNOSIS — E559 Vitamin D deficiency, unspecified: Secondary | ICD-10-CM

## 2023-07-14 DIAGNOSIS — R739 Hyperglycemia, unspecified: Secondary | ICD-10-CM

## 2023-07-14 DIAGNOSIS — L0292 Furuncle, unspecified: Secondary | ICD-10-CM

## 2023-07-14 DIAGNOSIS — I252 Old myocardial infarction: Secondary | ICD-10-CM

## 2023-07-14 DIAGNOSIS — J309 Allergic rhinitis, unspecified: Secondary | ICD-10-CM | POA: Diagnosis not present

## 2023-07-14 DIAGNOSIS — D126 Benign neoplasm of colon, unspecified: Secondary | ICD-10-CM

## 2023-07-14 DIAGNOSIS — Z125 Encounter for screening for malignant neoplasm of prostate: Secondary | ICD-10-CM

## 2023-07-14 LAB — URINALYSIS, ROUTINE W REFLEX MICROSCOPIC
Bilirubin Urine: NEGATIVE
Hgb urine dipstick: NEGATIVE
Ketones, ur: NEGATIVE
Leukocytes,Ua: NEGATIVE
Nitrite: NEGATIVE
RBC / HPF: NONE SEEN (ref 0–?)
Specific Gravity, Urine: 1.02 (ref 1.000–1.030)
Total Protein, Urine: NEGATIVE
Urine Glucose: NEGATIVE
Urobilinogen, UA: 0.2 (ref 0.0–1.0)
WBC, UA: NONE SEEN (ref 0–?)
pH: 6 (ref 5.0–8.0)

## 2023-07-14 LAB — LIPID PANEL
Cholesterol: 161 mg/dL (ref 0–200)
HDL: 34.5 mg/dL — ABNORMAL LOW (ref >39.00)
LDL Cholesterol: 99 mg/dL (ref 0–99)
NonHDL: 126.73
Total CHOL/HDL Ratio: 5
Triglycerides: 137 mg/dL (ref 0.0–149.0)
VLDL: 27.4 mg/dL (ref 0.0–40.0)

## 2023-07-14 LAB — HEPATIC FUNCTION PANEL
ALT: 45 U/L (ref 0–53)
AST: 36 U/L (ref 0–37)
Albumin: 4.2 g/dL (ref 3.5–5.2)
Alkaline Phosphatase: 67 U/L (ref 39–117)
Bilirubin, Direct: 0.2 mg/dL (ref 0.0–0.3)
Total Bilirubin: 0.7 mg/dL (ref 0.2–1.2)
Total Protein: 6.7 g/dL (ref 6.0–8.3)

## 2023-07-14 LAB — VITAMIN D 25 HYDROXY (VIT D DEFICIENCY, FRACTURES): VITD: >120 ng/mL

## 2023-07-14 LAB — BASIC METABOLIC PANEL
BUN: 23 mg/dL (ref 6–23)
CO2: 27 meq/L (ref 19–32)
Calcium: 9.7 mg/dL (ref 8.4–10.5)
Chloride: 104 meq/L (ref 96–112)
Creatinine, Ser: 1.31 mg/dL (ref 0.40–1.50)
GFR: 55.21 mL/min — ABNORMAL LOW (ref >60.00)
Glucose, Bld: 86 mg/dL (ref 70–99)
Potassium: 4.7 meq/L (ref 3.5–5.1)
Sodium: 141 meq/L (ref 135–145)

## 2023-07-14 LAB — CBC WITH DIFFERENTIAL/PLATELET
Basophils Absolute: 0.1 10*3/uL (ref 0.0–0.1)
Basophils Relative: 1.5 % (ref 0.0–3.0)
Eosinophils Absolute: 0.2 10*3/uL (ref 0.0–0.7)
Eosinophils Relative: 3.2 % (ref 0.0–5.0)
HCT: 39.5 % (ref 39.0–52.0)
Hemoglobin: 13 g/dL (ref 13.0–17.0)
Lymphocytes Relative: 25.9 % (ref 12.0–46.0)
Lymphs Abs: 2 10*3/uL (ref 0.7–4.0)
MCHC: 32.9 g/dL (ref 30.0–36.0)
MCV: 89.4 fL (ref 78.0–100.0)
Monocytes Absolute: 0.8 10*3/uL (ref 0.1–1.0)
Monocytes Relative: 10.3 % (ref 3.0–12.0)
Neutro Abs: 4.6 10*3/uL (ref 1.4–7.7)
Neutrophils Relative %: 59.1 % (ref 43.0–77.0)
Platelets: 276 10*3/uL (ref 150.0–400.0)
RBC: 4.42 Mil/uL (ref 4.22–5.81)
RDW: 13.9 % (ref 11.5–15.5)
WBC: 7.7 10*3/uL (ref 4.0–10.5)

## 2023-07-14 LAB — PSA: PSA: 1.69 ng/mL (ref 0.10–4.00)

## 2023-07-14 LAB — HEMOGLOBIN A1C: Hgb A1c MFr Bld: 5.7 % (ref 4.6–6.5)

## 2023-07-14 LAB — TSH: TSH: 0.9 u[IU]/mL (ref 0.35–5.50)

## 2023-07-14 LAB — VITAMIN B12: Vitamin B-12: 268 pg/mL (ref 211–911)

## 2023-07-14 MED ORDER — METOPROLOL TARTRATE 25 MG PO TABS: 25.0000 mg | ORAL_TABLET | Freq: Two times a day (BID) | ORAL | 3 refills | Status: DC

## 2023-07-14 MED ORDER — SILDENAFIL CITRATE 100 MG PO TABS: ORAL_TABLET | ORAL | 5 refills | Status: DC

## 2023-07-14 MED ORDER — CLONAZEPAM 1 MG PO TABS: ORAL_TABLET | ORAL | 2 refills | Status: DC

## 2023-07-14 MED ORDER — ZOLPIDEM TARTRATE 10 MG PO TABS: 10.0000 mg | ORAL_TABLET | Freq: Every day | ORAL | 1 refills | Status: DC

## 2023-07-14 MED ORDER — MUPIROCIN 2 % EX OINT: 1.0000 | TOPICAL_OINTMENT | Freq: Two times a day (BID) | CUTANEOUS | 0 refills | Status: AC

## 2023-07-14 MED ORDER — OMEPRAZOLE 20 MG PO CPDR: 20.0000 mg | DELAYED_RELEASE_CAPSULE | Freq: Every day | ORAL | 3 refills | Status: DC

## 2023-07-14 MED ORDER — SULFAMETHOXAZOLE-TRIMETHOPRIM 800-160 MG PO TABS: 1.0000 | ORAL_TABLET | Freq: Two times a day (BID) | ORAL | 0 refills | Status: AC

## 2023-07-14 NOTE — Patient Instructions (Signed)
Please take all new medication as prescribed - the antibiotic and nasal ointment for 5 days  Please continue all other medications as before, and refills have been done if requested.  Please have the pharmacy call with any other refills you may need.  Please continue your efforts at being more active, low cholesterol diet, and weight control.  You are otherwise up to date with prevention measures today.  Please keep your appointments with your specialists as you may have planned  You will be contacted regarding the referral for: colonoscopy  Please go to the LAB at the blood drawing area for the tests to be done  You will be contacted by phone if any changes need to be made immediately.  Otherwise, you will receive a letter about your results with an explanation, but please check with MyChart first.  Please make an Appointment to return for your 1 year visit, or sooner if needed

## 2023-07-14 NOTE — Progress Notes (Signed)
The test results show that your current treatment is OK, as the tests are stable.  Please continue the same plan.  There is no other need for change of treatment or further evaluation based on these results, at this time.  thanks 

## 2023-07-14 NOTE — Progress Notes (Unsigned)
Patient ID: [name] Michael, male   DOB: 09-28-1950, 72 y.o.   MRN: [id number]         Chief Complaint:: wellness exam and Annual Exam (Has been having right ear pain for the past couple weeks comes and goes )  , allergies, chronic bilateral hearing loss, hld, recurrent boils       HPI:  [name] Michael is a 72 y.o. male here for wellness exam; due for colonoscopy mar 2025, declines covid booster, for shingrix at pharmacy o/w up to date               Also has recurrent boils of the groin and today of the right knee.  Has also mild intermittent right ear fullness and crackloing popping.  Has chronic bilateral hearing loss no change.  Tolerating zetia per cardiology.  Pt denies chest pain, increased sob or doe, wheezing, orthopnea, PND, increased LE swelling, palpitations, dizziness or syncope.  Pt denies polydipsia, polyuria, or new focal neuro s/s.    Pt denies fever, wt loss, night sweats, loss of appetite, or other constitutional symptoms     Wt Readings from Last 3 Encounters:  07/14/23 207 lb (93.9 kg)  01/16/23 196 lb 6.4 oz (89.1 kg)  07/10/22 205 lb (93 kg)   BP Readings from Last 3 Encounters:  07/14/23 130/82  07/10/22 132/78  06/24/22 (!) 130/92   Immunization History  Administered Date(s) Administered   Fluad Quad(high Dose 65+) 04/18/2019, 07/11/2020, 05/20/2023   Influenza Split 05/18/2012, 04/05/2014   Influenza Whole 06/09/2007, 04/28/2013   Influenza, High Dose Seasonal PF 06/09/2021   Influenza,inj,Quad PF,6+ Mos 05/03/2017, 05/12/2018   Influenza-Unspecified 05/16/2016, 06/15/2022   Moderna Sars-Covid-2 Vaccination 09/17/2019, 10/15/2019, 04/17/2020, 12/20/2020   Pneumococcal Conjugate-13 05/26/2015   Pneumococcal Polysaccharide-23 04/07/2018   Td 02/17/2009   Tdap 04/22/2015   Zoster, Live 04/01/2013   Health Maintenance Due  Topic Date Due   Zoster Vaccines- Shingrix (1 of 2) 03/07/1972   COVID-19 Vaccine (5 - 2023-24 season) 04/06/2023   Colonoscopy   10/07/2023      Past Medical History:  Diagnosis Date   ALLERGIC RHINITIS 04/30/2007   Allergy    mild   ANEMIA-NOS 07/12/2007   ANXIETY 02/17/2009   Blood transfusion without reported diagnosis    bleeding ulcers in 2008 or 2009   Cancer (HCC) 10/2019   lip cancer removed    Cataract    bilat removed    Clotting disorder (HCC)    PE 2009- pt unaware of this finding    Coronary artery disease involving native coronary artery of native heart with unstable angina pectoris (HCC) 05/10/2011   DEPRESSIVE DISORDER 02/14/2009   Fibromyalgia    GERD (gastroesophageal reflux disease)    History of pulmonary embolism 03/09/2008   HYPERLIPIDEMIA 04/27/2007   HYPERTENSION 04/27/2007   INSOMNIA UNSPECIFIED 02/20/2010   Lupus    "w/RA" (08/20/2017)   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   Migraine    "stopped in the early 2000s" (08/20/2017)   Non-ST elevation (NSTEMI) myocardial infarction (HCC) -TYPE 4A peri-PCI 08/21/2017   Nonspecific abnormal toxicological findings 03/14/2008   NSTEMI (non-ST elevated myocardial infarction) (HCC) 08/20/2017   OBESITY 04/30/2007   OSTEOARTHRITIS 04/30/2007   PEPTIC ULCER DISEASE 07/12/2007   Presence of drug coated stent in left circumflex coronary artery 08/21/2017   PROSTATITIS, HX OF 04/30/2007   RESTRICTIVE LUNG DISEASE 06/20/2009   Rheumatoid arthritis (HCC)    THORACIC AORTIC ANEURYSM 07/06/2009   Chest CT 11/12: ascending thoracic aortic aneurysm stable  at 42 mm.    UNSPECIFIED MYALGIA AND MYOSITIS 02/14/2009   Past Surgical History:  Procedure Laterality Date   APPENDECTOMY     BACK SURGERY  2017   cervical discectomy C3-C4    CARDIAC CATHETERIZATION  06/2008   Non-obstructive CAD.  EF 40-45%. Anterior - apical HK   COLONOSCOPY  2010   CORONARY ANGIOPLASTY WITH STENT PLACEMENT  08/20/2017   CORONARY STENT INTERVENTION N/A 08/20/2017   Procedure: CORONARY STENT INTERVENTION;  Surgeon: Tonny Bollman, MD;  Location: [facility] INVASIVE CV LAB;   Service: Cardiovascular;  Laterality: N/A;   LEFT HEART CATH AND CORONARY ANGIOGRAPHY N/A 08/20/2017   Procedure: LEFT HEART CATH AND CORONARY ANGIOGRAPHY;  Surgeon: Tonny Bollman, MD;  Location: [facility] INVASIVE CV LAB;  Service: Cardiovascular;  Laterality: N/A;   NM MYOVIEW LTD  06/2009   Abnormal stress nuclear study with small prior lateral infarct; no ischemia. Normal EF   POLYPECTOMY  04/14/2009   TA x 1   SURGERY OF LIP  10/2019   TRANSTHORACIC ECHOCARDIOGRAM  06/2009   55% to 60%. Wall motion was normal.  Mod Dilated [place].  Mild LA dilation.   UPPER GASTROINTESTINAL ENDOSCOPY      reports that he has never smoked. He has never used smokeless tobacco. He reports that he does not drink alcohol and does not use drugs. family history includes Alcohol abuse in an other family member; Diabetes in his brother, brother, father, mother, and another family member; Heart disease in his brother, father, mother, and another family member; Hyperlipidemia in his father and mother; Hypertension in his brother, sister, and another family member; Stroke in his father, mother, and another family member. Allergies  Allergen Reactions   Atorvastatin     REACTION: myalgias   Ciprofloxacin Other (See Comments)    myalgias   Crestor [Rosuvastatin]     myalgias   Doxycycline    Current Outpatient Medications on File Prior to Visit  Medication Sig Dispense Refill   Ascorbic Acid (VITAMIN C PO) Take 1 tablet by mouth daily in the afternoon.     aspirin 81 MG EC tablet Take 1 tablet (81 mg total) by mouth daily. 30 tablet 11   B Complex-Folic Acid (B COMPLEX PLUS) TABS Take 1 tablet by mouth daily. 100 tablet 3   Cholecalciferol (VITAMIN D) 50 MCG (2000 UT) CAPS Take 2,000 Units by mouth daily in the afternoon.     ezetimibe (ZETIA) 10 MG tablet TAKE ONE TABLET BY MOUTH ONE TIME DAILY 30 tablet 0   folic acid (FOLVITE) 800 MCG tablet Take 400 mcg by mouth daily.     hydroxychloroquine (PLAQUENIL) 200 MG  tablet Take 200 mg by mouth 2 (two) times daily.     leflunomide (ARAVA) 20 MG tablet Take 20 mg by mouth daily.       Omega-3 Fatty Acids (FISH OIL) 1200 MG CAPS Take 1,200 mg by mouth daily in the afternoon.     Turmeric 500 MG CAPS Take 500 mg by mouth daily in the afternoon.     No current facility-administered medications on file prior to visit.        ROS:  All others reviewed and negative.  Objective        PE:  BP 130/82 (BP Location: Right Arm, Patient Position: Sitting, Cuff Size: Normal)   Pulse 65   Temp 98.2 F (36.8 C) (Oral)   Ht 5' 11.5" (1.816 m)   Wt 207 lb (93.9 kg)  SpO2 98%   BMI 28.47 kg/m                 Constitutional: Pt appears in NAD               HENT: Head: NCAT.                Right Ear: External ear normal.                 Left Ear: External ear normal.  Bilat tm's with mild erythema.  Max sinus areas non tender.  Pharynx with mild erythema, no exudate               Eyes: . Pupils are equal, round, and reactive to light. Conjunctivae and EOM are normal               Nose: without d/c or deformity               Neck: Neck supple. Gross normal ROM               Cardiovascular: Normal rate and regular rhythm.                 Pulmonary/Chest: Effort normal and breath sounds without rales or wheezing.                Abd:  Soft, NT, ND, + BS, no organomegaly               Neurological: Pt is alert. At baseline orientation, motor grossly intact               Skin: Skin is warm., LE edema - none, right anterior knee with 1 cm red, tender slight raised slight fluctuance               Psychiatric: Pt behavior is normal without agitation   Micro: none  Cardiac tracings I have personally interpreted today:  none  Pertinent Radiological findings (summarize): none   Lab Results  Component Value Date   WBC 7.7 07/14/2023   HGB 13.0 07/14/2023   HCT 39.5 07/14/2023   PLT 276.0 07/14/2023   GLUCOSE 86 07/14/2023   CHOL 161 07/14/2023   TRIG 137.0  07/14/2023   HDL 34.50 (L) 07/14/2023   LDLDIRECT 131.0 04/03/2017   LDLCALC 99 07/14/2023   ALT 45 07/14/2023   AST 36 07/14/2023   NA 141 07/14/2023   K 4.7 07/14/2023   CL 104 07/14/2023   CREATININE 1.31 07/14/2023   BUN 23 07/14/2023   CO2 27 07/14/2023   TSH 0.90 07/14/2023   PSA 1.69 07/14/2023   INR 1.00 08/21/2017   HGBA1C 5.7 07/14/2023   Assessment/Plan:  [name] Michael is a 72 y.o. White or Caucasian [1] male with  has a past medical history of ALLERGIC RHINITIS (04/30/2007), Allergy, ANEMIA-NOS (07/12/2007), ANXIETY (02/17/2009), Blood transfusion without reported diagnosis, Cancer (HCC) (10/2019), Cataract, Clotting disorder (HCC), Coronary artery disease involving native coronary artery of native heart with unstable angina pectoris (HCC) (05/10/2011), DEPRESSIVE DISORDER (02/14/2009), Fibromyalgia, GERD (gastroesophageal reflux disease), History of pulmonary embolism (03/09/2008), HYPERLIPIDEMIA (04/27/2007), HYPERTENSION (04/27/2007), INSOMNIA UNSPECIFIED (02/20/2010), Lupus, MALLORY-WEISS SYNDROME (04/27/2007), Migraine, Non-ST elevation (NSTEMI) myocardial infarction (HCC) -TYPE 4A peri-PCI (08/21/2017), Nonspecific abnormal toxicological findings (03/14/2008), NSTEMI (non-ST elevated myocardial infarction) (HCC) (08/20/2017), OBESITY (04/30/2007), OSTEOARTHRITIS (04/30/2007), PEPTIC ULCER DISEASE (07/12/2007), Presence of drug coated stent in left circumflex coronary artery (08/21/2017), PROSTATITIS, HX OF (04/30/2007), RESTRICTIVE LUNG DISEASE (06/20/2009), Rheumatoid arthritis (HCC), THORACIC AORTIC  ANEURYSM (07/06/2009), and UNSPECIFIED MYALGIA AND MYOSITIS (02/14/2009).  Encounter for well adult exam with abnormal findings Age and sex appropriate education and counseling updated with regular exercise and diet Referrals for preventative services - for colonoscopy mar 2025 Immunizations addressed - for shingrx at pharmacy, declines covid booster Smoking counseling  -  none needed Evidence for depression or other mood disorder - none significant Most recent labs reviewed. I have personally reviewed and have noted: 1) the patient's medical and social history 2) The patient's current medications and supplements 3) The patient's height, weight, and BMI have been recorded in the chart   Recurrent boils Mild to mod, for antibx course - doxycycline 100 bid, and mupirocin nasal ointment.  to f/u any worsening symptoms or concerns  Hyperlipidemia Lab Results  Component Value Date   LDLCALC 99 07/14/2023   uncontrolled pt to continue current statin zetia 10 mg every day, delcines add repatha for now   Hyperglycemia Lab Results  Component Value Date   HGBA1C 5.7 07/14/2023   Stable, pt to continue current medical treatment  - diet, wt control   Essential hypertension BP Readings from Last 3 Encounters:  07/14/23 130/82  07/10/22 132/78  06/24/22 (!) 130/92   Stable, pt to continue medical treatment lopressor 25 bid   B12 deficiency Lab Results  Component Value Date   VITAMINB12 268 07/14/2023   Low, to start oral replacement - b12 1000 mcg qd   Adenomatous colon polyp For colonoscopy mar 2025  Allergic rhinitis Mild to mod, for nasacort asd, and mucinex bid for right ear eustachian tybe dysfxn,  to f/u any worsening symptoms or concerns  Followup: Return in about 1 year (around 07/13/2024).  Oliver Barre, MD 07/16/2023 8:07 PM [facility]

## 2023-07-14 NOTE — Telephone Encounter (Signed)
Ok to let pt know to reduce the Vit D3 to Mon wed fri only  - thanks

## 2023-07-15 NOTE — Telephone Encounter (Signed)
Called and let Pt know

## 2023-07-16 ENCOUNTER — Encounter: Payer: Self-pay | Admitting: Internal Medicine

## 2023-07-16 NOTE — Assessment & Plan Note (Signed)
Mild to mod, for nasacort asd, and mucinex bid for right ear eustachian tybe dysfxn,  to f/u any worsening symptoms or concerns

## 2023-07-16 NOTE — Assessment & Plan Note (Signed)
BP Readings from Last 3 Encounters:  07/14/23 130/82  07/10/22 132/78  06/24/22 (!) 130/92   Stable, pt to continue medical treatment lopressor 25 bid

## 2023-07-16 NOTE — Assessment & Plan Note (Signed)
Lab Results  Component Value Date   HGBA1C 5.7 07/14/2023   Stable, pt to continue current medical treatment  - diet, wt control

## 2023-07-16 NOTE — Assessment & Plan Note (Signed)
For colonoscopy mar 2025

## 2023-07-16 NOTE — Assessment & Plan Note (Signed)
Mild to mod, for antibx course - doxycycline 100 bid, and mupirocin nasal ointment.  to f/u any worsening symptoms or concerns

## 2023-07-16 NOTE — Assessment & Plan Note (Addendum)
Lab Results  Component Value Date   LDLCALC 99 07/14/2023   uncontrolled pt to continue current statin zetia 10 mg every day, delcines add repatha for now

## 2023-07-16 NOTE — Assessment & Plan Note (Signed)
Lab Results  Component Value Date   VITAMINB12 268 07/14/2023   Low, to start oral replacement - b12 1000 mcg qd

## 2023-07-16 NOTE — Assessment & Plan Note (Signed)
Age and sex appropriate education and counseling updated with regular exercise and diet Referrals for preventative services - for colonoscopy mar 2025 Immunizations addressed - for shingrx at pharmacy, declines covid booster Smoking counseling  - none needed Evidence for depression or other mood disorder - none significant Most recent labs reviewed. I have personally reviewed and have noted: 1) the patient's medical and social history 2) The patient's current medications and supplements 3) The patient's height, weight, and BMI have been recorded in the chart

## 2023-07-24 DIAGNOSIS — M0589 Other rheumatoid arthritis with rheumatoid factor of multiple sites: Secondary | ICD-10-CM | POA: Diagnosis not present

## 2023-07-24 DIAGNOSIS — M797 Fibromyalgia: Secondary | ICD-10-CM | POA: Diagnosis not present

## 2023-07-24 DIAGNOSIS — M5030 Other cervical disc degeneration, unspecified cervical region: Secondary | ICD-10-CM | POA: Diagnosis not present

## 2023-07-24 DIAGNOSIS — M1991 Primary osteoarthritis, unspecified site: Secondary | ICD-10-CM | POA: Diagnosis not present

## 2023-07-24 DIAGNOSIS — Z6829 Body mass index (BMI) 29.0-29.9, adult: Secondary | ICD-10-CM | POA: Diagnosis not present

## 2023-07-24 DIAGNOSIS — E663 Overweight: Secondary | ICD-10-CM | POA: Diagnosis not present

## 2023-08-08 ENCOUNTER — Other Ambulatory Visit: Payer: Self-pay | Admitting: Cardiology

## 2023-08-08 DIAGNOSIS — E782 Mixed hyperlipidemia: Secondary | ICD-10-CM

## 2023-08-19 ENCOUNTER — Ambulatory Visit (HOSPITAL_COMMUNITY)
Admission: RE | Admit: 2023-08-19 | Discharge: 2023-08-19 | Disposition: A | Discharge status: Home / Self Care | Payer: Medicare HMO | Source: Ambulatory Visit | Attending: Cardiology | Admitting: Cardiology

## 2023-08-19 DIAGNOSIS — I712 Thoracic aortic aneurysm, without rupture, unspecified: Secondary | ICD-10-CM | POA: Insufficient documentation

## 2023-08-19 DIAGNOSIS — I7121 Aneurysm of the ascending aorta, without rupture: Secondary | ICD-10-CM | POA: Diagnosis not present

## 2023-08-19 DIAGNOSIS — I700 Atherosclerosis of aorta: Secondary | ICD-10-CM | POA: Diagnosis not present

## 2023-08-19 DIAGNOSIS — I2510 Atherosclerotic heart disease of native coronary artery without angina pectoris: Secondary | ICD-10-CM | POA: Diagnosis not present

## 2023-08-19 DIAGNOSIS — K8020 Calculus of gallbladder without cholecystitis without obstruction: Secondary | ICD-10-CM | POA: Diagnosis not present

## 2023-08-19 MED ORDER — SODIUM CHLORIDE (PF) 0.9 % IJ SOLN
INTRAMUSCULAR | Status: AC
  Filled 2023-08-19: qty 50

## 2023-08-19 MED ORDER — IOHEXOL 350 MG/ML SOLN
80.0000 mL | Freq: Once | INTRAVENOUS | Status: AC | PRN
  Administered 2023-08-19: 80 mL via INTRAVENOUS

## 2023-08-25 ENCOUNTER — Other Ambulatory Visit: Payer: Self-pay | Admitting: *Deleted

## 2023-08-25 ENCOUNTER — Encounter: Payer: Self-pay | Admitting: *Deleted

## 2023-08-25 DIAGNOSIS — I712 Thoracic aortic aneurysm, without rupture, unspecified: Secondary | ICD-10-CM

## 2023-08-26 ENCOUNTER — Other Ambulatory Visit: Payer: Self-pay | Admitting: Cardiology

## 2023-08-26 DIAGNOSIS — E782 Mixed hyperlipidemia: Secondary | ICD-10-CM

## 2023-08-27 ENCOUNTER — Other Ambulatory Visit: Payer: Self-pay | Admitting: Cardiology

## 2023-08-27 DIAGNOSIS — E782 Mixed hyperlipidemia: Secondary | ICD-10-CM

## 2023-08-27 DIAGNOSIS — Z135 Encounter for screening for eye and ear disorders: Secondary | ICD-10-CM | POA: Diagnosis not present

## 2023-08-27 DIAGNOSIS — H52223 Regular astigmatism, bilateral: Secondary | ICD-10-CM | POA: Diagnosis not present

## 2023-08-27 DIAGNOSIS — Z961 Presence of intraocular lens: Secondary | ICD-10-CM | POA: Diagnosis not present

## 2023-08-27 DIAGNOSIS — H524 Presbyopia: Secondary | ICD-10-CM | POA: Diagnosis not present

## 2023-08-27 DIAGNOSIS — H5203 Hypermetropia, bilateral: Secondary | ICD-10-CM | POA: Diagnosis not present

## 2023-08-27 NOTE — Telephone Encounter (Signed)
Attempted to contact patient to get him scheduled for an appt to refill Zetia. Last office visit 03/28/22 with Dr Jens Som. Left message to call back.

## 2023-08-28 MED ORDER — EZETIMIBE 10 MG PO TABS: 10.0000 mg | ORAL_TABLET | Freq: Every day | ORAL | 0 refills | Status: DC

## 2023-08-28 NOTE — Telephone Encounter (Signed)
Prescription sent to [facility] pharmacy for 15 tabs with no refill. Noted patient needs a follow up appt for further refills to pharmacy,

## 2023-08-30 DIAGNOSIS — Z0100 Encounter for examination of eyes and vision without abnormal findings: Secondary | ICD-10-CM | POA: Diagnosis not present

## 2023-09-08 ENCOUNTER — Other Ambulatory Visit: Payer: Self-pay | Admitting: Cardiology

## 2023-09-08 DIAGNOSIS — E782 Mixed hyperlipidemia: Secondary | ICD-10-CM

## 2023-09-18 ENCOUNTER — Encounter: Payer: Self-pay | Admitting: Gastroenterology

## 2023-10-08 ENCOUNTER — Ambulatory Visit (AMBULATORY_SURGERY_CENTER): Payer: Medicare HMO | Admitting: *Deleted

## 2023-10-08 VITALS — Ht 71.5 in | Wt 200.0 lb

## 2023-10-08 DIAGNOSIS — Z860100 Personal history of colon polyps, unspecified: Secondary | ICD-10-CM

## 2023-10-08 MED ORDER — HYOSCYAMINE SULFATE 0.125 MG SL SUBL: 0.1250 mg | SUBLINGUAL_TABLET | SUBLINGUAL | 0 refills | Status: AC | PRN

## 2023-10-08 NOTE — Progress Notes (Signed)
 Pt's name and DOB verified at the beginning of the pre-visit wit 2 identifiers  Permission given to speak with  Pt denies any difficulty with ambulating,sitting, laying down or rolling side to side  Pt has no issues with ambulation   Pt has no issues moving head neck or swallowing  No egg or soy allergy known to patient   No issues known to pt with past sedation with any surgeries or procedures  Pt denies having issues being intubated  No FH of Malignant Hyperthermia  Pt is not on diet pills or shots  Pt is not on home 02   Pt is not on blood thinners   Pt denies issues with constipation   Pt is not on dialysis  Pt HX OF SVT  Pt denies any upcoming cardiac testing May 20th 2025 CARD ov  Patient's chart reviewed by Cathlyn Parsons CNRA prior to pre-visit and patient appropriate for the [facility].  Pre-visit completed and red dot placed by patient's name on their procedure day (on provider's schedule).    isit by phone  Pt states weight is 200 lb  IInstructions reviewed. Pt given [facility], [facility] main # and MD on call # prior to instructions.  Pt states understanding of instructions. Instructed to review again prior to procedure. Pt states they will.   Informed pt that they will receive a text or  call from [facility] regarding there prep med.

## 2023-10-13 ENCOUNTER — Telehealth: Payer: Self-pay | Admitting: Gastroenterology

## 2023-10-13 DIAGNOSIS — Z860100 Personal history of colon polyps, unspecified: Secondary | ICD-10-CM

## 2023-10-13 MED ORDER — SUFLAVE 178.7 G PO SOLR: 1.0000 | Freq: Once | ORAL | 0 refills | Status: DC

## 2023-10-13 NOTE — Telephone Encounter (Signed)
 Informed pt that new RX has been sent to [facility] and if he does not get contacted by tomorrow to call us. Pt states he will.

## 2023-10-13 NOTE — Telephone Encounter (Signed)
 Inbound call from patient states he spoke with [facility] and they advised they do not have a prescription for him. Patient is requesting a call back. Please advise, thank you.

## 2023-10-14 MED ORDER — SUFLAVE 178.7 G PO SOLR
1.0000 | Freq: Once | ORAL | 0 refills | Status: AC
Start: 2023-10-14 — End: 2023-10-14

## 2023-10-14 NOTE — Telephone Encounter (Signed)
 Patient called stating that he has still not heard from [facility]. Requesting a call to discuss. Please advise.

## 2023-10-14 NOTE — Telephone Encounter (Signed)
 Spoke with pt. Rx corrected and sent to [facility].

## 2023-10-14 NOTE — Addendum Note (Signed)
 Addended by: Darylene Price on: 10/14/2023 04:18 PM   Modules accepted: Orders

## 2023-10-22 DIAGNOSIS — L812 Freckles: Secondary | ICD-10-CM | POA: Diagnosis not present

## 2023-10-22 DIAGNOSIS — L570 Actinic keratosis: Secondary | ICD-10-CM | POA: Diagnosis not present

## 2023-10-22 DIAGNOSIS — L738 Other specified follicular disorders: Secondary | ICD-10-CM | POA: Diagnosis not present

## 2023-10-22 DIAGNOSIS — L814 Other melanin hyperpigmentation: Secondary | ICD-10-CM | POA: Diagnosis not present

## 2023-10-22 DIAGNOSIS — D692 Other nonthrombocytopenic purpura: Secondary | ICD-10-CM | POA: Diagnosis not present

## 2023-10-22 DIAGNOSIS — Z85828 Personal history of other malignant neoplasm of skin: Secondary | ICD-10-CM | POA: Diagnosis not present

## 2023-10-22 DIAGNOSIS — L821 Other seborrheic keratosis: Secondary | ICD-10-CM | POA: Diagnosis not present

## 2023-10-22 DIAGNOSIS — D225 Melanocytic nevi of trunk: Secondary | ICD-10-CM | POA: Diagnosis not present

## 2023-10-23 ENCOUNTER — Encounter: Payer: Medicare HMO | Admitting: Gastroenterology

## 2023-10-23 DIAGNOSIS — M0589 Other rheumatoid arthritis with rheumatoid factor of multiple sites: Secondary | ICD-10-CM | POA: Diagnosis not present

## 2023-12-09 NOTE — Progress Notes (Signed)
 HPI: FU CAD. Patient is status post cardiac catheterization in 2019 which revealed 90% ostial circumflex, 50% proximal right coronary artery, 50% mid right coronary artery.  Patient had PCI of left circumflex with a drug-eluting stent. Nuclear study December 2019 showed ejection fraction 47% but visually appears better; inferolateral defect suggestive of diaphragmatic attenuation but small area of inferolateral ischemia cannot be excluded. Patient is intolerant to statins.  Echocardiogram September 2023 showed normal LV function, dilated ascending aorta at 4.7 cm, mild mitral regurgitation, trace aortic insufficiency.  Abdominal ultrasound November 2023 showed no evidence of abdominal aortic aneurysm.  CTA January 2025 showed 4.4 cm thoracic aortic aneurysm.  Since last seen, the patient denies any dyspnea on exertion, orthopnea, PND, pedal edema, palpitations, syncope or chest pain.   Current Outpatient Medications  Medication Sig Dispense Refill   Ascorbic Acid (VITAMIN C PO) Take 1 tablet by mouth daily in the afternoon.     aspirin  81 MG EC tablet Take 1 tablet (81 mg total) by mouth daily. 30 tablet 11   B Complex-Folic Acid  (B COMPLEX PLUS) TABS Take 1 tablet by mouth daily. 100 tablet 3   Cholecalciferol (VITAMIN D ) 50 MCG (2000 UT) CAPS Take 2,000 Units by mouth daily in the afternoon.     clonazePAM  (KLONOPIN ) 1 MG tablet TAKE ONE TABLET BY MOUTH TWICE DAILY AS NEEDED FOR ANXIETY 60 tablet 2   ezetimibe  (ZETIA ) 10 MG tablet Take 10 mg by mouth daily.     folic acid  (FOLVITE ) 800 MCG tablet Take 400 mcg by mouth daily.     hydroxychloroquine  (PLAQUENIL ) 200 MG tablet Take 200 mg by mouth 2 (two) times daily.     hyoscyamine  (LEVSIN SL) 0.125 MG SL tablet Place 1 tablet (0.125 mg total) under the tongue every 4 (four) hours as needed. 30 tablet 0   leflunomide (ARAVA) 20 MG tablet Take 20 mg by mouth daily.       metoprolol  tartrate (LOPRESSOR ) 25 MG tablet Take 1 tablet (25 mg total)  by mouth 2 (two) times daily. 180 tablet 3   mupirocin  ointment (BACTROBAN ) 2 % Apply 1 Application topically 2 (two) times daily. 22 g 0   Omega-3 Fatty Acids (FISH OIL) 1200 MG CAPS Take 1,200 mg by mouth daily in the afternoon.     omeprazole  (PRILOSEC) 20 MG capsule Take 1 capsule (20 mg total) by mouth daily. 90 capsule 3   sildenafil  (VIAGRA ) 100 MG tablet TAKE ONE TABLET BY MOUTH DAILY AS NEEDED 10 tablet 5   Turmeric 500 MG CAPS Take 500 mg by mouth daily in the afternoon.     zolpidem  (AMBIEN ) 10 MG tablet Take 1 tablet (10 mg total) by mouth at bedtime. 90 tablet 1   sulfamethoxazole -trimethoprim  (BACTRIM  DS) 800-160 MG tablet Take 1 tablet by mouth 2 (two) times daily. 20 tablet 0   No current facility-administered medications for this visit.     Past Medical History:  Diagnosis Date   ALLERGIC RHINITIS 04/30/2007   Allergy    mild   ANEMIA-NOS 07/12/2007   ANXIETY 02/17/2009   Blood transfusion without reported diagnosis    bleeding ulcers in 2008 or 2009   Cancer [facility]) 10/2019   lip cancer removed    Cataract    bilat removed    Clotting disorder (HCC)    PE 2009- pt unaware of this finding    Coronary artery disease involving native coronary artery of native heart with unstable angina pectoris (HCC) 05/10/2011  DEPRESSIVE DISORDER 02/14/2009   Fibromyalgia    GERD (gastroesophageal reflux disease)    History of pulmonary embolism 03/09/2008   HYPERLIPIDEMIA 04/27/2007   HYPERTENSION 04/27/2007   INSOMNIA UNSPECIFIED 02/20/2010   Lupus    "w/RA" (08/20/2017)   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   Migraine    "stopped in the early 2000s" (08/20/2017)   Non-ST elevation (NSTEMI) myocardial infarction (HCC) -TYPE 4A peri-PCI 08/21/2017   Nonspecific abnormal toxicological findings 03/14/2008   NSTEMI (non-ST elevated myocardial infarction) (HCC) 08/20/2017   OBESITY 04/30/2007   OSTEOARTHRITIS 04/30/2007   PEPTIC ULCER DISEASE 07/12/2007   Presence of drug  coated stent in left circumflex coronary artery 08/21/2017   PROSTATITIS, HX OF 04/30/2007   RESTRICTIVE LUNG DISEASE 06/20/2009   Rheumatoid arthritis (HCC)    THORACIC AORTIC ANEURYSM 07/06/2009   Chest CT 11/12: ascending thoracic aortic aneurysm stable at 42 mm.    UNSPECIFIED MYALGIA AND MYOSITIS 02/14/2009    Past Surgical History:  Procedure Laterality Date   APPENDECTOMY     BACK SURGERY  2017   cervical discectomy C3-C4    CARDIAC CATHETERIZATION  06/2008   Non-obstructive CAD.  EF 40-45%. Anterior - apical HK   COLONOSCOPY  2010   CORONARY ANGIOPLASTY WITH STENT PLACEMENT  08/20/2017   CORONARY STENT INTERVENTION N/A 08/20/2017   Procedure: CORONARY STENT INTERVENTION;  Surgeon: Arnoldo Lapping, MD;  Location: [facility] INVASIVE CV LAB;  Service: Cardiovascular;  Laterality: N/A;   LEFT HEART CATH AND CORONARY ANGIOGRAPHY N/A 08/20/2017   Procedure: LEFT HEART CATH AND CORONARY ANGIOGRAPHY;  Surgeon: Arnoldo Lapping, MD;  Location: [facility] INVASIVE CV LAB;  Service: Cardiovascular;  Laterality: N/A;   NM MYOVIEW  LTD  06/2009   Abnormal stress nuclear study with small prior lateral infarct; no ischemia. Normal EF   POLYPECTOMY  04/14/2009   TA x 1   SURGERY OF LIP  10/2019   TRANSTHORACIC ECHOCARDIOGRAM  06/2009   55% to 60%. Wall motion was normal.  Mod Dilated Aoi Root.  Mild LA dilation.   UPPER GASTROINTESTINAL ENDOSCOPY      Social History   Socioeconomic History   Marital status: Married    Spouse name: Not on file   Number of children: Not on file   Years of education: Not on file   Highest education level: Not on file  Occupational History   Not on file  Tobacco Use   Smoking status: Never   Smokeless tobacco: Never  Vaping Use   Vaping status: Never Used  Substance and Sexual Activity   Alcohol use: No   Drug use: No   Sexual activity: Yes  Other Topics Concern   Not on file  Social History Narrative   Not on file   Social Drivers of Health   Financial  Resource Strain: Low Risk  (01/16/2023)   Overall Financial Resource Strain (CARDIA)    Difficulty of Paying Living Expenses: Not hard at all  Food Insecurity: No Food Insecurity (01/16/2023)   Hunger Vital Sign    Worried About Running Out of Food in the Last Year: Never true    Ran Out of Food in the Last Year: Never true  Transportation Needs: No Transportation Needs (01/16/2023)   PRAPARE - Administrator, Civil Service (Medical): No    Lack of Transportation (Non-Medical): No  Physical Activity: Insufficiently Active (01/16/2023)   Exercise Vital Sign    Days of Exercise per Week: 5 days    Minutes of Exercise per Session:  20 min  Stress: No Stress Concern Present (01/16/2023)   [employer] of Occupational Health - Occupational Stress Questionnaire    Feeling of Stress : Not at all  Social Connections: Moderately Isolated (01/16/2023)   Social Connection and Isolation Panel [NHANES]    Frequency of Communication with Friends and Family: More than three times a week    Frequency of Social Gatherings with Friends and Family: More than three times a week    Attends Religious Services: Never    Database administrator or Organizations: No    Attends Banker Meetings: Never    Marital Status: Married  Catering manager Violence: Not At Risk (01/16/2023)   Humiliation, Afraid, Rape, and Kick questionnaire    Fear of Current or Ex-Partner: No    Emotionally Abused: No    Physically Abused: No    Sexually Abused: No    Family History  Problem Relation Age of Onset   Diabetes Mother    Heart disease Mother    Hyperlipidemia Mother    Stroke Mother    Diabetes Father    Heart disease Father    Hyperlipidemia Father    Stroke Father    Hypertension Sister    Diabetes Brother    Diabetes Brother    Heart disease Brother    Hypertension Brother    Alcohol abuse Other    Diabetes Other    Hypertension Other    Heart disease Other    Stroke Other     Colon cancer Neg Hx    Colon polyps Neg Hx    Esophageal cancer Neg Hx    Stomach cancer Neg Hx    Rectal cancer Neg Hx     ROS: no fevers or chills, productive cough, hemoptysis, dysphasia, odynophagia, melena, hematochezia, dysuria, hematuria, rash, seizure activity, orthopnea, PND, pedal edema, claudication. Remaining systems are negative.  Physical Exam: Well-developed well-nourished in no acute distress.  Skin is warm and dry.  HEENT is normal.  Neck is supple.  Chest is clear to auscultation with normal expansion.  Cardiovascular exam is regular rate and rhythm.  Abdominal exam nontender or distended. No masses palpated. Extremities show no edema. neuro grossly intact  EKG Interpretation Date/Time:  Tuesday Dec 23 2023 09:00:50 EDT Ventricular Rate:  64 PR Interval:  168 QRS Duration:  98 QT Interval:  400 QTC Calculation: 412 R Axis:   72  Text Interpretation: Normal sinus rhythm Normal ECG Confirmed by Alexandria Angel ([id number]) on 12/23/2023 9:01:47 AM    A/P  1 coronary artery disease-patient is not having chest pain.  Continue medical therapy.  Continue aspirin .  He is intolerant to statins.  2 thoracic aortic aneurysm-plan follow-up CTA January 2026.  3 hypertension-patient's blood pressure is controlled.  Continue present medical regimen.  4 hyperlipidemia-intolerant to statins.  Continue Zetia .  He is intolerant to statins and declined PCSK9 inhibitors previously.  Alexandria Angel, MD

## 2023-12-23 ENCOUNTER — Ambulatory Visit: Payer: Medicare HMO | Attending: Cardiology | Admitting: Cardiology

## 2023-12-23 ENCOUNTER — Encounter: Payer: Self-pay | Admitting: Cardiology

## 2023-12-23 VITALS — BP 128/76 | HR 66 | Ht 71.5 in | Wt 205.4 lb

## 2023-12-23 DIAGNOSIS — I10 Essential (primary) hypertension: Secondary | ICD-10-CM | POA: Diagnosis not present

## 2023-12-23 DIAGNOSIS — I2510 Atherosclerotic heart disease of native coronary artery without angina pectoris: Secondary | ICD-10-CM | POA: Diagnosis not present

## 2023-12-23 DIAGNOSIS — E782 Mixed hyperlipidemia: Secondary | ICD-10-CM | POA: Diagnosis not present

## 2023-12-23 DIAGNOSIS — I712 Thoracic aortic aneurysm, without rupture, unspecified: Secondary | ICD-10-CM

## 2023-12-23 NOTE — Patient Instructions (Signed)
 Medication Instructions:  No changes  *If you need a refill on your cardiac medications before your next appointment, please call your pharmacy*   Lab Work: Not needed   Testing/Procedures: Not needed   Follow-Up: At [facility], you and your health needs are our priority.  As part of our continuing mission to provide you with exceptional heart care, we have created designated Provider Care Teams.  These Care Teams include your primary Cardiologist (physician) and Advanced Practice Providers (APPs -  Physician Assistants and Nurse Practitioners) who all work together to provide you with the care you need, when you need it.     Your next appointment:   12 month(s)  The format for your next appointment:   In Person  Provider:   Alexandria Angel, MD

## 2024-01-19 ENCOUNTER — Other Ambulatory Visit: Payer: Self-pay | Admitting: Internal Medicine

## 2024-01-19 ENCOUNTER — Other Ambulatory Visit: Payer: Self-pay | Admitting: Cardiology

## 2024-01-19 DIAGNOSIS — I10 Essential (primary) hypertension: Secondary | ICD-10-CM

## 2024-01-21 ENCOUNTER — Ambulatory Visit: Payer: Medicare HMO

## 2024-01-22 DIAGNOSIS — M0589 Other rheumatoid arthritis with rheumatoid factor of multiple sites: Secondary | ICD-10-CM | POA: Diagnosis not present

## 2024-01-22 DIAGNOSIS — Z6829 Body mass index (BMI) 29.0-29.9, adult: Secondary | ICD-10-CM | POA: Diagnosis not present

## 2024-01-22 DIAGNOSIS — E663 Overweight: Secondary | ICD-10-CM | POA: Diagnosis not present

## 2024-01-22 DIAGNOSIS — M797 Fibromyalgia: Secondary | ICD-10-CM | POA: Diagnosis not present

## 2024-01-22 DIAGNOSIS — M5030 Other cervical disc degeneration, unspecified cervical region: Secondary | ICD-10-CM | POA: Diagnosis not present

## 2024-01-22 DIAGNOSIS — M1991 Primary osteoarthritis, unspecified site: Secondary | ICD-10-CM | POA: Diagnosis not present

## 2024-02-09 ENCOUNTER — Other Ambulatory Visit: Payer: Self-pay | Admitting: Internal Medicine

## 2024-02-20 ENCOUNTER — Other Ambulatory Visit: Payer: Self-pay | Admitting: Cardiology

## 2024-04-12 ENCOUNTER — Other Ambulatory Visit: Payer: Self-pay | Admitting: Cardiology

## 2024-04-12 DIAGNOSIS — I10 Essential (primary) hypertension: Secondary | ICD-10-CM

## 2024-04-19 ENCOUNTER — Telehealth: Payer: Self-pay | Admitting: Cardiology

## 2024-04-19 MED ORDER — LOSARTAN POTASSIUM 100 MG PO TABS: 100.0000 mg | ORAL_TABLET | Freq: Every day | ORAL | 3 refills | Status: AC

## 2024-04-19 NOTE — Telephone Encounter (Signed)
Refill sent to the pharmacy electronically.  

## 2024-04-19 NOTE — Telephone Encounter (Signed)
 Pt c/o medication issue:  1. Name of Medication: :Losartan  100mg    2. How are you currently taking this medication (dosage and times per day)? Once daily  3. Are you having a reaction (difficulty breathing--STAT)? No     4. What is your medication issue? Pt called in requesting a refill for this medication, no longer on his med list. Please advise. He would like it sent to [id number] - [place], [place] - [address] Phone: [phone]  Fax: [phone]

## 2024-04-19 NOTE — Telephone Encounter (Signed)
 Losartan  was increased to 100 mg daily on 08/23/22 by Dr Pietro. It was removed from his list on 01/16/23.  Was not on list when he saw Dr Pietro earlier this year.  I spoke with patient who reports he has been taking Losartan  100 mg daily since it was increased on 08/23/22.  Will send to Dr Pietro to see if OK to add back to medication list and send refill to pharmacy.  I told patient I would let him know if any problems with refilling

## 2024-04-22 DIAGNOSIS — M0589 Other rheumatoid arthritis with rheumatoid factor of multiple sites: Secondary | ICD-10-CM | POA: Diagnosis not present

## 2024-04-30 ENCOUNTER — Encounter: Payer: Self-pay | Admitting: Gastroenterology

## 2024-05-27 ENCOUNTER — Other Ambulatory Visit: Payer: Self-pay | Admitting: Internal Medicine

## 2024-05-27 ENCOUNTER — Other Ambulatory Visit: Payer: Self-pay

## 2024-06-03 ENCOUNTER — Ambulatory Visit (INDEPENDENT_AMBULATORY_CARE_PROVIDER_SITE_OTHER)

## 2024-06-03 VITALS — Ht 71.5 in | Wt 205.0 lb

## 2024-06-03 DIAGNOSIS — Z0000 Encounter for general adult medical examination without abnormal findings: Secondary | ICD-10-CM

## 2024-06-03 NOTE — Progress Notes (Signed)
 Subjective:   [name] Michael is a 73 y.o. who presents for a Medicare Wellness preventive visit.  As a reminder, Annual Wellness Visits don't include a physical exam, and some assessments may be limited, especially if this visit is performed virtually. We may recommend an in-person follow-up visit with your provider if needed.  Visit Complete: Virtual I connected with  [name] Michael on 06/03/24 by a audio enabled telemedicine application and verified that I am speaking with the correct person using two identifiers.  Patient Location: Home  Provider Location: Office/Clinic  I discussed the limitations of evaluation and management by telemedicine. The patient expressed understanding and agreed to proceed.  Vital Signs: Because this visit was a virtual/telehealth visit, some criteria may be missing or patient reported. Any vitals not documented were not able to be obtained and vitals that have been documented are patient reported.  VideoDeclined- This patient declined Librarian, academic. Therefore the visit was completed with audio only.  Persons Participating in Visit: Patient.  AWV Questionnaire: No: Patient Medicare AWV questionnaire was not completed prior to this visit.  Cardiac Risk Factors include: advanced age (>67men, >76 women);hypertension;male gender;dyslipidemia;Other (see comment), Risk factor comments: CAD, NSTEMI CKD stage3     Objective:    Today's Vitals   06/03/24 1008  Weight: 205 lb (93 kg)  Height: 5' 11.5 (1.816 m)   Body mass index is 28.19 kg/m.     06/03/2024   10:22 AM 01/16/2023    9:30 AM 08/20/2017   10:17 PM  Advanced Directives  Does Patient Have a Medical Advance Directive? No Yes Yes   Type of Special Educational Needs Teacher of [place];Living will Living will  Does patient want to make changes to medical advance directive?   No - Patient declined   Copy of Healthcare Power of Attorney in Chart?  No - copy  requested      Data saved with a previous flowsheet row definition    Current Medications (verified) Outpatient Encounter Medications as of 06/03/2024  Medication Sig   Ascorbic Acid (VITAMIN C PO) Take 1 tablet by mouth daily in the afternoon.   aspirin  81 MG EC tablet Take 1 tablet (81 mg total) by mouth daily.   B Complex-Folic Acid  (B COMPLEX PLUS) TABS Take 1 tablet by mouth daily.   Cholecalciferol (VITAMIN D ) 50 MCG (2000 UT) CAPS Take 2,000 Units by mouth daily in the afternoon.   clonazePAM  (KLONOPIN ) 1 MG tablet TAKE ONE TABLET BY MOUTH TWICE DAILY AS NEEDED FOR ANXIETY   ezetimibe  (ZETIA ) 10 MG tablet Take 1 tablet (10 mg total) by mouth daily.   folic acid  (FOLVITE ) 800 MCG tablet Take 400 mcg by mouth daily.   hydroxychloroquine  (PLAQUENIL ) 200 MG tablet Take 200 mg by mouth 2 (two) times daily.   leflunomide (ARAVA) 20 MG tablet Take 20 mg by mouth daily.     losartan  (COZAAR ) 100 MG tablet Take 1 tablet (100 mg total) by mouth daily.   metoprolol  tartrate (LOPRESSOR ) 25 MG tablet Take 1 tablet (25 mg total) by mouth 2 (two) times daily.   mupirocin  ointment (BACTROBAN ) 2 % Apply 1 Application topically 2 (two) times daily.   Omega-3 Fatty Acids (FISH OIL) 1200 MG CAPS Take 1,200 mg by mouth daily in the afternoon.   omeprazole  (PRILOSEC) 20 MG capsule Take 1 capsule (20 mg total) by mouth daily.   sildenafil  (VIAGRA ) 100 MG tablet TAKE ONE TABLET BY MOUTH DAILY AS NEEDED  Turmeric 500 MG CAPS Take 500 mg by mouth daily in the afternoon.   zolpidem  (AMBIEN ) 10 MG tablet Take one tablet (10 mg total) by mouth at bedtime.   hyoscyamine  (LEVSIN  SL) 0.125 MG SL tablet Place 1 tablet (0.125 mg total) under the tongue every 4 (four) hours as needed. (Patient not taking: Reported on 06/03/2024)   sulfamethoxazole -trimethoprim  (BACTRIM  DS) 800-160 MG tablet Take 1 tablet by mouth 2 (two) times daily. (Patient not taking: Reported on 06/03/2024)   No facility-administered encounter  medications on file as of 06/03/2024.    Allergies (verified) Atorvastatin, Ciprofloxacin, Crestor  [rosuvastatin ], and Doxycycline    History: Past Medical History:  Diagnosis Date   ALLERGIC RHINITIS 04/30/2007   Allergy    mild   ANEMIA-NOS 07/12/2007   ANXIETY 02/17/2009   Blood transfusion without reported diagnosis    bleeding ulcers in 2008 or 2009   Cancer (HCC) 10/2019   lip cancer removed    Cataract    bilat removed    Clotting disorder    PE 2009- pt unaware of this finding    Coronary artery disease involving native coronary artery of native heart with unstable angina pectoris (HCC) 05/10/2011   DEPRESSIVE DISORDER 02/14/2009   Fibromyalgia    GERD (gastroesophageal reflux disease)    History of pulmonary embolism 03/09/2008   HYPERLIPIDEMIA 04/27/2007   HYPERTENSION 04/27/2007   INSOMNIA UNSPECIFIED 02/20/2010   Lupus    w/RA (08/20/2017)   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   Migraine    stopped in the early 2000s (08/20/2017)   Non-ST elevation (NSTEMI) myocardial infarction (HCC) -TYPE 4A peri-PCI 08/21/2017   Nonspecific abnormal toxicological findings 03/14/2008   NSTEMI (non-ST elevated myocardial infarction) (HCC) 08/20/2017   OBESITY 04/30/2007   OSTEOARTHRITIS 04/30/2007   PEPTIC ULCER DISEASE 07/12/2007   Presence of drug coated stent in left circumflex coronary artery 08/21/2017   PROSTATITIS, HX OF 04/30/2007   RESTRICTIVE LUNG DISEASE 06/20/2009   Rheumatoid arthritis (HCC)    THORACIC AORTIC ANEURYSM 07/06/2009   Chest CT 11/12: ascending thoracic aortic aneurysm stable at 42 mm.    UNSPECIFIED MYALGIA AND MYOSITIS 02/14/2009   Past Surgical History:  Procedure Laterality Date   APPENDECTOMY     BACK SURGERY  2017   cervical discectomy C3-C4    CARDIAC CATHETERIZATION  06/2008   Non-obstructive CAD.  EF 40-45%. Anterior - apical HK   COLONOSCOPY  2010   CORONARY ANGIOPLASTY WITH STENT PLACEMENT  08/20/2017   CORONARY STENT  INTERVENTION N/A 08/20/2017   Procedure: CORONARY STENT INTERVENTION;  Surgeon: Wonda Sharper, MD;  Location: [facility] INVASIVE CV LAB;  Service: Cardiovascular;  Laterality: N/A;   LEFT HEART CATH AND CORONARY ANGIOGRAPHY N/A 08/20/2017   Procedure: LEFT HEART CATH AND CORONARY ANGIOGRAPHY;  Surgeon: Wonda Sharper, MD;  Location: [facility] INVASIVE CV LAB;  Service: Cardiovascular;  Laterality: N/A;   NM MYOVIEW  LTD  06/2009   Abnormal stress nuclear study with small prior lateral infarct; no ischemia. Normal EF   POLYPECTOMY  04/14/2009   TA x 1   SURGERY OF LIP  10/2019   TRANSTHORACIC ECHOCARDIOGRAM  06/2009   55% to 60%. Wall motion was normal.  Mod Dilated Aoi Root.  Mild LA dilation.   UPPER GASTROINTESTINAL ENDOSCOPY     Family History  Problem Relation Age of Onset   Diabetes Mother    Heart disease Mother    Hyperlipidemia Mother    Stroke Mother    Diabetes Father    Heart disease Father  Hyperlipidemia Father    Stroke Father    Hypertension Sister    Diabetes Brother    Diabetes Brother    Heart disease Brother    Hypertension Brother    Alcohol abuse Other    Diabetes Other    Hypertension Other    Heart disease Other    Stroke Other    Colon cancer Neg Hx    Colon polyps Neg Hx    Esophageal cancer Neg Hx    Stomach cancer Neg Hx    Rectal cancer Neg Hx    Social History   Socioeconomic History   Marital status: Married    Spouse name: Reena   Number of children: 3   Years of education: Not on file   Highest education level: Not on file  Occupational History   Occupation: RETIRED  Tobacco Use   Smoking status: Never   Smokeless tobacco: Never  Vaping Use   Vaping status: Never Used  Substance and Sexual Activity   Alcohol use: No   Drug use: No   Sexual activity: Yes  Other Topics Concern   Not on file  Social History Narrative   Lives with wife and has 1 dog/2025   Social Drivers of Health   Financial Resource Strain: Low Risk  (06/03/2024)    Overall Financial Resource Strain (CARDIA)    Difficulty of Paying Living Expenses: Not very hard  Food Insecurity: No Food Insecurity (06/03/2024)   Hunger Vital Sign    Worried About Running Out of Food in the Last Year: Never true    Ran Out of Food in the Last Year: Never true  Transportation Needs: No Transportation Needs (06/03/2024)   PRAPARE - Administrator, Civil Service (Medical): No    Lack of Transportation (Non-Medical): No  Physical Activity: Insufficiently Active (06/03/2024)   Exercise Vital Sign    Days of Exercise per Week: 2 days    Minutes of Exercise per Session: 20 min  Stress: No Stress Concern Present (06/03/2024)   [employer] of Occupational Health - Occupational Stress Questionnaire    Feeling of Stress: Only a little  Social Connections: Moderately Isolated (06/03/2024)   Social Connection and Isolation Panel    Frequency of Communication with Friends and Family: More than three times a week    Frequency of Social Gatherings with Friends and Family: Once a week    Attends Religious Services: Never    Database Administrator or Organizations: No    Attends Engineer, Structural: Never    Marital Status: Married    Tobacco Counseling Counseling given: Not Answered    Clinical Intake:  Pre-visit preparation completed: Yes  Pain : No/denies pain     BMI - recorded: 28.19 Nutritional Status: BMI 25 -29 Overweight Nutritional Risks: None Diabetes: No  Lab Results  Component Value Date   HGBA1C 5.7 07/14/2023   HGBA1C 5.6 07/10/2022   HGBA1C 5.6 07/12/2021     How often do you need to have someone help you when you read instructions, pamphlets, or other written materials from your doctor or pharmacy?: 1 - Never  Interpreter Needed?: No  Information entered by :: Lamyah Creed, RMA   Activities of Daily Living     06/03/2024   10:10 AM 01/20/2024    4:46 PM  In your present state of health, do you have any  difficulty performing the following activities:  Hearing? 0 0  Vision? 0 1  Difficulty concentrating  or making decisions? 0 0  Walking or climbing stairs? 0 0  Dressing or bathing? 0 0  Doing errands, shopping? 0 0  Preparing Food and eating ? N N  Using the Toilet? N N  In the past six months, have you accidently leaked urine? N N  Do you have problems with loss of bowel control? N N  Managing your Medications? N N  Managing your Finances? N N  Housekeeping or managing your Housekeeping? N N    Patient Care Team: Norleen Lynwood ORN, MD as PCP - General (Internal Medicine) Pietro Redell RAMAN, MD as PCP - Cardiology (Cardiology)  I have updated your Care Teams any recent Medical Services you may have received from other providers in the past year.     Assessment:   This is a routine wellness examination for [name] Michael.  Hearing/Vision screen Hearing Screening - Comments:: Denies hearing difficulties   Vision Screening - Comments:: Wears eyeglasses for reading/[facility]/ per pt-up to date   Goals Addressed   None    Depression Screen     06/03/2024   10:28 AM 07/14/2023   10:53 AM 01/16/2023    9:28 AM 07/12/2021    8:39 AM 07/12/2021    8:20 AM 07/11/2020    8:57 AM 07/11/2020    8:18 AM  PHQ 2/9 Scores  PHQ - 2 Score 0 0 0 0 0 0 0  PHQ- 9 Score 0          Fall Risk     06/03/2024   10:24 AM 01/20/2024    4:46 PM 07/14/2023   10:53 AM 01/16/2023    9:30 AM 07/12/2021    8:39 AM  Fall Risk   Falls in the past year? 0 0 0 0 0  Number falls in past yr: 0  0 0 0  Injury with Fall? 0  0 0 0  Risk for fall due to :   No Fall Risks No Fall Risks   Follow up Falls evaluation completed;Falls prevention discussed  Falls evaluation completed Falls prevention discussed     MEDICARE RISK AT HOME:  Medicare Risk at Home Any stairs in or around the home?: Yes (back porch) If so, are there any without handrails?: No Home free of loose throw rugs in walkways, pet beds,  electrical cords, etc?: Yes Adequate lighting in your home to reduce risk of falls?: Yes Life alert?: No Use of a cane, walker or w/c?: No Grab bars in the bathroom?: No Shower chair or bench in shower?: No Elevated toilet seat or a handicapped toilet?: No  TIMED UP AND GO:  Was the test performed?  No  Cognitive Function: 6CIT completed        06/03/2024   10:24 AM 01/16/2023    9:31 AM  6CIT Screen  What Year? 0 points 0 points  What month? 0 points 0 points  What time? 0 points 0 points  Count back from 20 0 points 0 points  Months in reverse 0 points 0 points  Repeat phrase 0 points 0 points  Total Score 0 points 0 points    Immunizations Immunization History  Administered Date(s) Administered   Fluad Quad(high Dose 65+) 04/18/2019, 07/11/2020, 05/20/2023   INFLUENZA, HIGH DOSE SEASONAL PF 06/09/2021   Influenza Split 05/18/2012, 04/05/2014   Influenza Whole 06/09/2007, 04/28/2013   Influenza,inj,Quad PF,6+ Mos 05/03/2017, 05/12/2018   Influenza-Unspecified 05/16/2016, 06/15/2022   Moderna Sars-Covid-2 Vaccination 09/17/2019, 10/15/2019, 04/17/2020, 12/20/2020   Pneumococcal  Conjugate-13 05/26/2015   Pneumococcal Polysaccharide-23 04/07/2018   Td 02/17/2009   Tdap 04/22/2015   Zoster, Live 04/01/2013    Screening Tests Health Maintenance  Topic Date Due   Zoster Vaccines- Shingrix (1 of 2) 03/07/1972   Colonoscopy  10/07/2023   Medicare Annual Wellness (AWV)  01/16/2024   Influenza Vaccine  03/05/2024   COVID-19 Vaccine (5 - 2025-26 season) 04/05/2024   DTaP/Tdap/Td (3 - Td or Tdap) 04/21/2025   Pneumococcal Vaccine: 50+ Years  Completed   Hepatitis C Screening  Completed   Meningococcal B Vaccine  Aged Out    Health Maintenance Items Addressed: Vaccines Due: Flu and Shingrx, Referral sent to GI for colonoscopy, See Nurse Notes at the end of this note  Additional Screening:  Vision Screening: Recommended annual ophthalmology exams for early  detection of glaucoma and other disorders of the eye. Is the patient up to date with their annual eye exam?  Yes  Who is the provider or what is the name of the office in which the patient attends annual eye exams? [facility]/ per pt-up to date  Dental Screening: Recommended annual dental exams for proper oral hygiene  Community Resource Referral / Chronic Care Management: CRR required this visit?  No   CCM required this visit?  No   Plan:    I have personally reviewed and noted the following in the patient's chart:   Medical and social history Use of alcohol, tobacco or illicit drugs  Current medications and supplements including opioid prescriptions. Patient is not currently taking opioid prescriptions. Functional ability and status Nutritional status Physical activity Advanced directives List of other physicians Hospitalizations, surgeries, and ER visits in previous 12 months Vitals Screenings to include cognitive, depression, and falls Referrals and appointments  In addition, I have reviewed and discussed with patient certain preventive protocols, quality metrics, and best practice recommendations. A written personalized care plan for preventive services as well as general preventive health recommendations were provided to patient.   Samarra Ridgely L Eria Lozoya, CMA   06/03/2024   After Visit Summary: (MyChart) Due to this being a telephonic visit, the after visit summary with patients personalized plan was offered to patient via MyChart   Notes: Patient is due for a colonoscopy, which order was placed on 07/2023.  He is aware that the order will expire at the end of the year.  Patient is due for a Flu vaccine and a Shingrix vaccine.  He had no other concerns to address today.

## 2024-06-03 NOTE — Patient Instructions (Signed)
 [name] Michael,  Thank you for taking the time for your Medicare Wellness Visit. I appreciate your continued commitment to your health goals. Please review the care plan we discussed, and feel free to reach out if I can assist you further.  Medicare recommends these wellness visits once per year to help you and your care team stay ahead of potential health issues. These visits are designed to focus on prevention, allowing your provider to concentrate on managing your acute and chronic conditions during your regular appointments.  Please note that Annual Wellness Visits do not include a physical exam. Some assessments may be limited, especially if the visit was conducted virtually. If needed, we may recommend a separate in-person follow-up with your provider.  Ongoing Care Seeing your primary care provider every 3 to 6 months helps us  monitor your health and provide consistent, personalized care. Next office visit on 07/14/2024.  You are due for a colonoscopy, please call [facility], at [phone], to get scheduled.  You are also due for a Flu vaccine and a Shingles vaccine.    Referrals If a referral was made during today's visit and you haven't received any updates within two weeks, please contact the referred provider directly to check on the status.  Recommended Screenings:  Health Maintenance  Topic Date Due   Zoster (Shingles) Vaccine (1 of 2) 03/07/1972   Colon Cancer Screening  10/07/2023   Medicare Annual Wellness Visit  01/16/2024   Flu Shot  03/05/2024   COVID-19 Vaccine (5 - 2025-26 season) 04/05/2024   DTaP/Tdap/Td vaccine (3 - Td or Tdap) 04/21/2025   Pneumococcal Vaccine for age over 31  Completed   Hepatitis C Screening  Completed   Meningitis B Vaccine  Aged Out       06/03/2024   10:22 AM  Advanced Directives  Does Patient Have a Medical Advance Directive? No   Advance Care Planning is important because it: Ensures you receive medical  care that aligns with your values, goals, and preferences. Provides guidance to your family and loved ones, reducing the emotional burden of decision-making during critical moments.  Vision: Annual vision screenings are recommended for early detection of glaucoma, cataracts, and diabetic retinopathy. These exams can also reveal signs of chronic conditions such as diabetes and high blood pressure.  Dental: Annual dental screenings help detect early signs of oral cancer, gum disease, and other conditions linked to overall health, including heart disease and diabetes.  Please see the attached documents for additional preventive care recommendations.

## 2024-06-30 ENCOUNTER — Ambulatory Visit: Admitting: Podiatry

## 2024-06-30 DIAGNOSIS — M722 Plantar fascial fibromatosis: Secondary | ICD-10-CM

## 2024-06-30 MED ORDER — PREDNISONE 5 MG PO TABS: ORAL_TABLET | ORAL | 1 refills | Status: AC

## 2024-06-30 NOTE — Progress Notes (Signed)
 Presents complaining of pain on the plantar aspect of the foot on the right.  Has had a previous neuroma says its been doing well since we injected it several years ago.  Complaining of pain along the arch and bottom of the foot.   Physical exam:  General appearance: Pleasant, and in no acute distress. AOx3.  Vascular: Pedal pulses: DP 2 to/4 bilaterally, PT 2/4 bilaterally.  Mild edema lower legs bilaterally. Capillary fill time immediate bilaterally.  Neurological: Light touch intact feet bilaterally.  Normal Achilles reflex bilaterally.  No clonus or spasticity noted.  Negative Tinel sign tarsal tunnel and porta pedis bilaterally  Dermatologic:   Skin normal temperature bilaterally.  Skin normal color, tone, and texture bilaterally.   Musculoskeletal: Rocker-bottom foot right.  Tenderness along the plantar fascia plantar foot.  Prominence of the cuboid plantarly.  Possible inflamed bursa under the area.    Diagnosis: 1.  Plantar fasciitis right  Plan: -Established office visit for evaluation and management to the level 3. - Discussed the plantar fasciitis and probable bursitis on the plantar aspect of the foot.  Discussed contribution of the rocker-bottom foot to this.  It sounds like the neuromas have resolved.  Will try oral prednisone  today. -Rx prednisone  5 mg, 30 mg p.o. daily first day, then decrease by 5 mg every other day for 12 days   Return 2 weeks follow-up and radiographs 3 views right foot

## 2024-07-14 ENCOUNTER — Encounter: Payer: Self-pay | Admitting: Podiatry

## 2024-07-14 ENCOUNTER — Telehealth: Payer: Self-pay

## 2024-07-14 ENCOUNTER — Ambulatory Visit: Admitting: Podiatry

## 2024-07-14 ENCOUNTER — Ambulatory Visit: Payer: Medicare HMO | Admitting: Internal Medicine

## 2024-07-14 ENCOUNTER — Encounter: Payer: Self-pay | Admitting: Internal Medicine

## 2024-07-14 ENCOUNTER — Ambulatory Visit (INDEPENDENT_AMBULATORY_CARE_PROVIDER_SITE_OTHER)

## 2024-07-14 ENCOUNTER — Ambulatory Visit: Payer: Self-pay | Admitting: Internal Medicine

## 2024-07-14 VITALS — BP 122/78 | HR 65 | Temp 98.0°F | Ht 71.5 in | Wt 204.0 lb

## 2024-07-14 DIAGNOSIS — N1831 Chronic kidney disease, stage 3a: Secondary | ICD-10-CM

## 2024-07-14 DIAGNOSIS — Z0001 Encounter for general adult medical examination with abnormal findings: Secondary | ICD-10-CM

## 2024-07-14 DIAGNOSIS — M14671 Charcot's joint, right ankle and foot: Secondary | ICD-10-CM

## 2024-07-14 DIAGNOSIS — E782 Mixed hyperlipidemia: Secondary | ICD-10-CM | POA: Diagnosis not present

## 2024-07-14 DIAGNOSIS — D126 Benign neoplasm of colon, unspecified: Secondary | ICD-10-CM

## 2024-07-14 DIAGNOSIS — Z125 Encounter for screening for malignant neoplasm of prostate: Secondary | ICD-10-CM

## 2024-07-14 DIAGNOSIS — E559 Vitamin D deficiency, unspecified: Secondary | ICD-10-CM

## 2024-07-14 DIAGNOSIS — M722 Plantar fascial fibromatosis: Secondary | ICD-10-CM

## 2024-07-14 DIAGNOSIS — M1711 Unilateral primary osteoarthritis, right knee: Secondary | ICD-10-CM | POA: Insufficient documentation

## 2024-07-14 DIAGNOSIS — R739 Hyperglycemia, unspecified: Secondary | ICD-10-CM

## 2024-07-14 DIAGNOSIS — E538 Deficiency of other specified B group vitamins: Secondary | ICD-10-CM | POA: Diagnosis not present

## 2024-07-14 DIAGNOSIS — I10 Essential (primary) hypertension: Secondary | ICD-10-CM

## 2024-07-14 LAB — URINALYSIS, ROUTINE W REFLEX MICROSCOPIC
Bilirubin Urine: NEGATIVE
Hgb urine dipstick: NEGATIVE
Ketones, ur: NEGATIVE
Leukocytes,Ua: NEGATIVE
Nitrite: NEGATIVE
RBC / HPF: NONE SEEN (ref 0–?)
Specific Gravity, Urine: 1.01 (ref 1.000–1.030)
Total Protein, Urine: NEGATIVE
Urine Glucose: NEGATIVE
Urobilinogen, UA: 0.2 (ref 0.0–1.0)
WBC, UA: NONE SEEN (ref 0–?)
pH: 6 (ref 5.0–8.0)

## 2024-07-14 LAB — CBC WITH DIFFERENTIAL/PLATELET
Basophils Absolute: 0.1 K/uL (ref 0.0–0.1)
Basophils Relative: 0.9 % (ref 0.0–3.0)
Eosinophils Absolute: 0.3 K/uL (ref 0.0–0.7)
Eosinophils Relative: 2.7 % (ref 0.0–5.0)
HCT: 41.2 % (ref 39.0–52.0)
Hemoglobin: 13.7 g/dL (ref 13.0–17.0)
Lymphocytes Relative: 23.6 % (ref 12.0–46.0)
Lymphs Abs: 2.3 K/uL (ref 0.7–4.0)
MCHC: 33.3 g/dL (ref 30.0–36.0)
MCV: 88.3 fl (ref 78.0–100.0)
Monocytes Absolute: 1.2 K/uL — ABNORMAL HIGH (ref 0.1–1.0)
Monocytes Relative: 11.9 % (ref 3.0–12.0)
Neutro Abs: 6 K/uL (ref 1.4–7.7)
Neutrophils Relative %: 60.9 % (ref 43.0–77.0)
Platelets: 264 K/uL (ref 150.0–400.0)
RBC: 4.66 Mil/uL (ref 4.22–5.81)
RDW: 13.2 % (ref 11.5–15.5)
WBC: 9.8 K/uL (ref 4.0–10.5)

## 2024-07-14 LAB — HEPATIC FUNCTION PANEL
ALT: 81 U/L — ABNORMAL HIGH (ref 0–53)
AST: 45 U/L — ABNORMAL HIGH (ref 0–37)
Albumin: 4.3 g/dL (ref 3.5–5.2)
Alkaline Phosphatase: 80 U/L (ref 39–117)
Bilirubin, Direct: 0.2 mg/dL (ref 0.0–0.3)
Total Bilirubin: 0.8 mg/dL (ref 0.2–1.2)
Total Protein: 6.8 g/dL (ref 6.0–8.3)

## 2024-07-14 LAB — VITAMIN B12: Vitamin B-12: 288 pg/mL (ref 211–911)

## 2024-07-14 LAB — LIPID PANEL
Cholesterol: 147 mg/dL (ref 0–200)
HDL: 47.7 mg/dL (ref >39.00)
LDL Cholesterol: 82 mg/dL (ref 0–99)
NonHDL: 99.54
Total CHOL/HDL Ratio: 3
Triglycerides: 87 mg/dL (ref 0.0–149.0)
VLDL: 17.4 mg/dL (ref 0.0–40.0)

## 2024-07-14 LAB — HEMOGLOBIN A1C: Hgb A1c MFr Bld: 5.5 % (ref 4.6–6.5)

## 2024-07-14 LAB — BASIC METABOLIC PANEL WITH GFR
BUN: 20 mg/dL (ref 6–23)
CO2: 29 meq/L (ref 19–32)
Calcium: 9.6 mg/dL (ref 8.4–10.5)
Chloride: 102 meq/L (ref 96–112)
Creatinine, Ser: 1.25 mg/dL (ref 0.40–1.50)
GFR: 58 mL/min — ABNORMAL LOW (ref >60.00)
Glucose, Bld: 76 mg/dL (ref 70–99)
Potassium: 4.9 meq/L (ref 3.5–5.1)
Sodium: 139 meq/L (ref 135–145)

## 2024-07-14 LAB — VITAMIN D 25 HYDROXY (VIT D DEFICIENCY, FRACTURES): VITD: 106.79 ng/mL (ref 30.00–100.00)

## 2024-07-14 LAB — PSA: PSA: 2.04 ng/mL (ref 0.10–4.00)

## 2024-07-14 LAB — TSH: TSH: 1.07 u[IU]/mL (ref 0.35–5.50)

## 2024-07-14 MED ORDER — ZOLPIDEM TARTRATE 10 MG PO TABS: 10.0000 mg | ORAL_TABLET | Freq: Every evening | ORAL | 1 refills | Status: AC | PRN

## 2024-07-14 MED ORDER — CLONAZEPAM 1 MG PO TABS: 1.0000 mg | ORAL_TABLET | Freq: Two times a day (BID) | ORAL | 2 refills | Status: AC | PRN

## 2024-07-14 NOTE — Progress Notes (Signed)
 Patient ID: [name] Michael, male   DOB: 1951/01/06, 73 y.o.   MRN: [id number]         Chief Complaint:: wellness exam and hld, htn, hyperglycemia, ckd3a, history of colon polyp, right knee arthritis       HPI:  [name] Michael is a 73 y.o. male here for wellness exam; for shingrix at pharmacy, due for colonoscopy, o/w up to date                Also has seen podiatry with right foot pain, better with prednisone  course.  Pt denies chest pain, increased sob or doe, wheezing, orthopnea, PND, increased LE swelling, palpitations, dizziness or syncope.  Pt denies polydipsia, polyuria, or new focal neuro s/s.    Pt denies fever, wt loss, night sweats, loss of appetite, or other constitutional symptoms  has gained several lbs but plans to be more active soon.  Has ongoing mild intermittent right knee pain without giveaways or falls.   Wt Readings from Last 3 Encounters:  07/14/24 204 lb (92.5 kg)  06/03/24 205 lb (93 kg)  12/23/23 205 lb 6.4 oz (93.2 kg)   BP Readings from Last 3 Encounters:  07/14/24 122/78  12/23/23 128/76  07/14/23 130/82   Immunization History  Administered Date(s) Administered   Fluad Quad(high Dose 65+) 04/18/2019, 07/11/2020, 05/20/2023   INFLUENZA, HIGH DOSE SEASONAL PF 06/09/2021   Influenza Split 05/18/2012, 04/05/2014   Influenza Whole 06/09/2007, 04/28/2013   Influenza,inj,Quad PF,6+ Mos 05/03/2017, 05/12/2018   Influenza-Unspecified 05/16/2016, 06/15/2022   Moderna Sars-Covid-2 Vaccination 09/17/2019, 10/15/2019, 04/17/2020, 12/20/2020   Pneumococcal Conjugate-13 05/26/2015   Pneumococcal Polysaccharide-23 04/07/2018   Td 02/17/2009   Tdap 04/22/2015   Zoster, Live 04/01/2013   Health Maintenance Due  Topic Date Due   Zoster Vaccines- Shingrix (1 of 2) 03/07/1972   Colonoscopy  10/07/2023   COVID-19 Vaccine (5 - 2025-26 season) 04/05/2024      Past Medical History:  Diagnosis Date   ALLERGIC RHINITIS 04/30/2007   Allergy    mild   ANEMIA-NOS  07/12/2007   ANXIETY 02/17/2009   Blood transfusion without reported diagnosis    bleeding ulcers in 2008 or 2009   Cancer (HCC) 10/2019   lip cancer removed    Cataract    bilat removed    Clotting disorder    PE 2009- pt unaware of this finding    Coronary artery disease involving native coronary artery of native heart with unstable angina pectoris (HCC) 05/10/2011   DEPRESSIVE DISORDER 02/14/2009   Fibromyalgia    GERD (gastroesophageal reflux disease)    History of pulmonary embolism 03/09/2008   HYPERLIPIDEMIA 04/27/2007   HYPERTENSION 04/27/2007   INSOMNIA UNSPECIFIED 02/20/2010   Lupus    w/RA (08/20/2017)   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   Migraine    stopped in the early 2000s (08/20/2017)   Non-ST elevation (NSTEMI) myocardial infarction (HCC) -TYPE 4A peri-PCI 08/21/2017   Nonspecific abnormal toxicological findings 03/14/2008   NSTEMI (non-ST elevated myocardial infarction) (HCC) 08/20/2017   OBESITY 04/30/2007   OSTEOARTHRITIS 04/30/2007   PEPTIC ULCER DISEASE 07/12/2007   Presence of drug coated stent in left circumflex coronary artery 08/21/2017   PROSTATITIS, HX OF 04/30/2007   RESTRICTIVE LUNG DISEASE 06/20/2009   Rheumatoid arthritis (HCC)    THORACIC AORTIC ANEURYSM 07/06/2009   Chest CT 11/12: ascending thoracic aortic aneurysm stable at 42 mm.    UNSPECIFIED MYALGIA AND MYOSITIS 02/14/2009   Past Surgical History:  Procedure Laterality Date   APPENDECTOMY  BACK SURGERY  2017   cervical discectomy C3-C4    CARDIAC CATHETERIZATION  06/2008   Non-obstructive CAD.  EF 40-45%. Anterior - apical HK   COLONOSCOPY  2010   CORONARY ANGIOPLASTY WITH STENT PLACEMENT  08/20/2017   CORONARY STENT INTERVENTION N/A 08/20/2017   Procedure: CORONARY STENT INTERVENTION;  Surgeon: Wonda Sharper, MD;  Location: [facility] INVASIVE CV LAB;  Service: Cardiovascular;  Laterality: N/A;   LEFT HEART CATH AND CORONARY ANGIOGRAPHY N/A 08/20/2017   Procedure: LEFT HEART CATH  AND CORONARY ANGIOGRAPHY;  Surgeon: Wonda Sharper, MD;  Location: [facility] INVASIVE CV LAB;  Service: Cardiovascular;  Laterality: N/A;   NM MYOVIEW  LTD  06/2009   Abnormal stress nuclear study with small prior lateral infarct; no ischemia. Normal EF   POLYPECTOMY  04/14/2009   TA x 1   SURGERY OF LIP  10/2019   TRANSTHORACIC ECHOCARDIOGRAM  06/2009   55% to 60%. Wall motion was normal.  Mod Dilated [place].  Mild LA dilation.   UPPER GASTROINTESTINAL ENDOSCOPY      reports that he has never smoked. He has never used smokeless tobacco. He reports that he does not drink alcohol and does not use drugs. family history includes Alcohol abuse in an other family member; Diabetes in his brother, brother, father, mother, and another family member; Heart disease in his brother, father, mother, and another family member; Hyperlipidemia in his father and mother; Hypertension in his brother, sister, and another family member; Stroke in his father, mother, and another family member. Allergies  Allergen Reactions   Atorvastatin     REACTION: myalgias   Ciprofloxacin Other (See Comments)    myalgias   Crestor  [Rosuvastatin ]     myalgias   Doxycycline     Current Outpatient Medications on File Prior to Visit  Medication Sig Dispense Refill   Ascorbic Acid (VITAMIN C PO) Take 1 tablet by mouth daily in the afternoon.     aspirin  81 MG EC tablet Take 1 tablet (81 mg total) by mouth daily. 30 tablet 11   B Complex-Folic Acid  (B COMPLEX PLUS) TABS Take 1 tablet by mouth daily. 100 tablet 3   Cholecalciferol (VITAMIN D ) 50 MCG (2000 UT) CAPS Take 2,000 Units by mouth daily in the afternoon.     ezetimibe  (ZETIA ) 10 MG tablet Take 1 tablet (10 mg total) by mouth daily. 90 tablet 3   folic acid  (FOLVITE ) 800 MCG tablet Take 400 mcg by mouth daily.     hydroxychloroquine  (PLAQUENIL ) 200 MG tablet Take 200 mg by mouth 2 (two) times daily.     hyoscyamine  (LEVSIN  SL) 0.125 MG SL tablet Place 1 tablet (0.125 mg  total) under the tongue every 4 (four) hours as needed. 30 tablet 0   leflunomide (ARAVA) 20 MG tablet Take 20 mg by mouth daily.       losartan  (COZAAR ) 100 MG tablet Take 1 tablet (100 mg total) by mouth daily. 90 tablet 3   metoprolol  tartrate (LOPRESSOR ) 25 MG tablet Take 1 tablet (25 mg total) by mouth 2 (two) times daily. 180 tablet 3   mupirocin  ointment (BACTROBAN ) 2 % Apply 1 Application topically 2 (two) times daily. 22 g 0   Omega-3 Fatty Acids (FISH OIL) 1200 MG CAPS Take 1,200 mg by mouth daily in the afternoon.     omeprazole  (PRILOSEC) 20 MG capsule Take 1 capsule (20 mg total) by mouth daily. 90 capsule 0   predniSONE  (DELTASONE ) 5 MG tablet prednisone  5 mg, 30 mg p.o.  daily first day, then decrease by 5 mg every other day for 12 days 42 tablet 1   sildenafil  (VIAGRA ) 100 MG tablet TAKE ONE TABLET BY MOUTH DAILY AS NEEDED 10 tablet 5   sulfamethoxazole -trimethoprim  (BACTRIM  DS) 800-160 MG tablet Take 1 tablet by mouth 2 (two) times daily. 20 tablet 0   Turmeric 500 MG CAPS Take 500 mg by mouth daily in the afternoon.     No current facility-administered medications on file prior to visit.        ROS:  All others reviewed and negative.  Objective        PE:  BP 122/78 (BP Location: Left Arm, Patient Position: Sitting, Cuff Size: Normal)   Pulse 65   Temp 98 F (36.7 C) (Oral)   Ht 5' 11.5 (1.816 m)   Wt 204 lb (92.5 kg)   SpO2 98%   BMI 28.06 kg/m                 Constitutional: Pt appears in NAD               HENT: Head: NCAT.                Right Ear: External ear normal.                 Left Ear: External ear normal.                Eyes: . Pupils are equal, round, and reactive to light. Conjunctivae and EOM are normal               Nose: without d/c or deformity               Neck: Neck supple. Gross normal ROM               Cardiovascular: Normal rate and regular rhythm.                 Pulmonary/Chest: Effort normal and breath sounds without rales or wheezing.                 Abd:  Soft, NT, ND, + BS, no organomegaly               Neurological: Pt is alert. At baseline orientation, motor grossly intact; right knee with degenerative changes and crepitus               Skin: Skin is warm. No rashes, no other new lesions, LE edema - none               Psychiatric: Pt behavior is normal without agitation   Micro: none  Cardiac tracings I have personally interpreted today:  none  Pertinent Radiological findings (summarize): none   Lab Results  Component Value Date   WBC 7.7 07/14/2023   HGB 13.0 07/14/2023   HCT 39.5 07/14/2023   PLT 276.0 07/14/2023   GLUCOSE 86 07/14/2023   CHOL 161 07/14/2023   TRIG 137.0 07/14/2023   HDL 34.50 (L) 07/14/2023   LDLDIRECT 131.0 04/03/2017   LDLCALC 99 07/14/2023   ALT 45 07/14/2023   AST 36 07/14/2023   NA 141 07/14/2023   K 4.7 07/14/2023   CL 104 07/14/2023   CREATININE 1.31 07/14/2023   BUN 23 07/14/2023   CO2 27 07/14/2023   TSH 0.90 07/14/2023   PSA 1.69 07/14/2023   INR 1.00 08/21/2017   HGBA1C 5.7 07/14/2023   Assessment/Plan:  [name] Michael is  a 73 y.o. White or Caucasian [1] male with  has a past medical history of ALLERGIC RHINITIS (04/30/2007), Allergy, ANEMIA-NOS (07/12/2007), ANXIETY (02/17/2009), Blood transfusion without reported diagnosis, Cancer (HCC) (10/2019), Cataract, Clotting disorder, Coronary artery disease involving native coronary artery of native heart with unstable angina pectoris (HCC) (05/10/2011), DEPRESSIVE DISORDER (02/14/2009), Fibromyalgia, GERD (gastroesophageal reflux disease), History of pulmonary embolism (03/09/2008), HYPERLIPIDEMIA (04/27/2007), HYPERTENSION (04/27/2007), INSOMNIA UNSPECIFIED (02/20/2010), Lupus, MALLORY-WEISS SYNDROME (04/27/2007), Migraine, Non-ST elevation (NSTEMI) myocardial infarction (HCC) -TYPE 4A peri-PCI (08/21/2017), Nonspecific abnormal toxicological findings (03/14/2008), NSTEMI (non-ST elevated myocardial infarction) (HCC)  (08/20/2017), OBESITY (04/30/2007), OSTEOARTHRITIS (04/30/2007), PEPTIC ULCER DISEASE (07/12/2007), Presence of drug coated stent in left circumflex coronary artery (08/21/2017), PROSTATITIS, HX OF (04/30/2007), RESTRICTIVE LUNG DISEASE (06/20/2009), Rheumatoid arthritis (HCC), THORACIC AORTIC ANEURYSM (07/06/2009), and UNSPECIFIED MYALGIA AND MYOSITIS (02/14/2009).  Encounter for well adult exam with abnormal findings Age and sex appropriate education and counseling updated with regular exercise and diet Referrals for preventative services - for colonoscopy as is overdue Immunizations addressed - for shingrix at pharmacy Smoking counseling  - none needed Evidence for depression or other mood disorder -stable depression  Most recent labs reviewed. I have personally reviewed and have noted: 1) the patient's medical and social history 2) The patient's current medications and supplements 3) The patient's height, weight, and BMI have been recorded in the chart   Hyperlipidemia Lab Results  Component Value Date   LDLCALC 99 07/14/2023   uncontrolled, pt to continue current statin Zetia  10 mg every day, for f/u lab today, goal < 70   Hyperglycemia Lab Results  Component Value Date   HGBA1C 5.7 07/14/2023   Stable, pt to continue current medical treatment   - diet, wt control   Essential hypertension BP Readings from Last 3 Encounters:  07/14/24 122/78  12/23/23 128/76  07/14/23 130/82   Stable, pt to continue medical treatment losartan  100 mg every day, lopressor  25 bid   CKD (chronic kidney disease) stage 3, GFR 30-59 ml/min (HCC) Ckd3a  Lab Results  Component Value Date   CREATININE 1.31 07/14/2023   Stable overall, cont to avoid nephrotoxins   B12 deficiency Lab Results  Component Value Date   VITAMINB12 268 07/14/2023   Low, to start oral replacement - b12 1000 mcg qd   Arthritis of right knee Mild intermittent , for otc voltaren gel prn  Adenomatous colon  polyp Pt overdue for f/u 3 yrs - for colonoscopy referral as had an insurance issue before    Followup: No follow-ups on file.  [name] Rush, MD 07/14/2024 12:59 PM [facility]

## 2024-07-14 NOTE — Patient Instructions (Addendum)
 Please have your Shingrix (shingles) shots done at your local pharmacy.  Please continue all other medications as before, and refills have been done if requested.  Please have the pharmacy call with any other refills you may need.  Please continue your efforts at being more active, low cholesterol diet, and weight control.  You are otherwise up to date with prevention measures today.  Please keep your appointments with your specialists as you may have planned  You will be contacted regarding the referral for: Colonoscopy  Please go to the LAB at the blood drawing area for the tests to be done  You will be contacted by phone if any changes need to be made immediately.  Otherwise, you will receive a letter about your results with an explanation, but please check with MyChart first.  Please make an Appointment to return in 6 months, or sooner if needed

## 2024-07-14 NOTE — Telephone Encounter (Signed)
 CRITICAL VALUE STICKER  CRITICAL VALUE: Vitamin D  106.79  RECEIVER (on-site recipient of call): Hadassah   DATE & TIME NOTIFIED: 07/14/2024 @ 4:11 pm  MESSENGER (representative from lab): [email or web address]  MD NOTIFIED: Yes  TIME OF NOTIFICATION: 4:12 pm

## 2024-07-14 NOTE — Assessment & Plan Note (Signed)
 BP Readings from Last 3 Encounters:  07/14/24 122/78  12/23/23 128/76  07/14/23 130/82   Stable, pt to continue medical treatment losartan  100 mg every day, lopressor  25 bid

## 2024-07-14 NOTE — Assessment & Plan Note (Signed)
 Lab Results  Component Value Date   VITAMINB12 268 07/14/2023   Low, to start oral replacement - b12 1000 mcg qd

## 2024-07-14 NOTE — Assessment & Plan Note (Signed)
 Mild intermittent , for otc voltaren gel prn

## 2024-07-14 NOTE — Telephone Encounter (Signed)
 As this minimally elevated only, it can be followed for now.   thanks

## 2024-07-14 NOTE — Assessment & Plan Note (Signed)
 Lab Results  Component Value Date   HGBA1C 5.7 07/14/2023   Stable, pt to continue current medical treatment  - diet, wt control

## 2024-07-14 NOTE — Assessment & Plan Note (Signed)
 Ckd3a  Lab Results  Component Value Date   CREATININE 1.31 07/14/2023   Stable overall, cont to avoid nephrotoxins

## 2024-07-14 NOTE — Progress Notes (Signed)
 Patient presents for follow-up pain plantar aspect foot right.  Doing much better after prednisone .  Just a little bit aching at this point.   Physical exam:  General appearance: Pleasant, and in no acute distress. AOx3.  Vascular: Pedal pulses: DP 2/4 bilaterally, PT 2/4 bilaterally.  Mild edema lower legs bilaterally. Capillary fill time and bilaterally.  Neurological: Light touch intact feet bilaterally.  Normal Achilles reflex bilaterally.   Dermatologic:   Skin normal temperature bilaterally.  Skin normal color, tone, and texture bilaterally.   Musculoskeletal: Slight soreness on the plantar aspect of the foot along the arch right.  Rocker-bottom foot right.  Radiographs: 3 views foot right: Severe healed Charcot changes along Lisfranc's joint.  Breakdown of the cuneiforms and compression into the navicular.  Distortion of alignment at cuneiform metatarsal joints secondary to Charcot.  No active bone reaction noted right now.  Diagnosis: 1.  Plantar fasciitis right. 2.  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 - Discussed with him the Charcot deformity in the foot and the rocker-bottom foot as a result.  Recommending extra-depth shoe to try to accommodate this.  This is probably the least expensive option at this point.  If it does continue to bother him we may need to consider molded shoe or custom insole for it. -Ice as needed.  Diclofenac gel as needed   Return as needed

## 2024-07-14 NOTE — Assessment & Plan Note (Signed)
 Lab Results  Component Value Date   LDLCALC 99 07/14/2023   uncontrolled, pt to continue current statin Zetia  10 mg every day, for f/u lab today, goal < 70

## 2024-07-14 NOTE — Assessment & Plan Note (Addendum)
 Age and sex appropriate education and counseling updated with regular exercise and diet Referrals for preventative services - for colonoscopy as is overdue Immunizations addressed - for shingrix at pharmacy Smoking counseling  - none needed Evidence for depression or other mood disorder -stable depression  Most recent labs reviewed. I have personally reviewed and have noted: 1) the patient's medical and social history 2) The patient's current medications and supplements 3) The patient's height, weight, and BMI have been recorded in the chart

## 2024-07-14 NOTE — Assessment & Plan Note (Signed)
 Pt overdue for f/u 3 yrs - for colonoscopy referral as had an insurance issue before

## 2024-07-17 ENCOUNTER — Other Ambulatory Visit: Payer: Self-pay | Admitting: Internal Medicine

## 2024-07-17 DIAGNOSIS — I252 Old myocardial infarction: Secondary | ICD-10-CM

## 2024-07-17 DIAGNOSIS — I10 Essential (primary) hypertension: Secondary | ICD-10-CM

## 2024-07-19 ENCOUNTER — Other Ambulatory Visit: Payer: Self-pay

## 2024-07-20 DIAGNOSIS — M1991 Primary osteoarthritis, unspecified site: Secondary | ICD-10-CM | POA: Diagnosis not present

## 2024-07-20 DIAGNOSIS — R7989 Other specified abnormal findings of blood chemistry: Secondary | ICD-10-CM | POA: Diagnosis not present

## 2024-07-20 DIAGNOSIS — M5030 Other cervical disc degeneration, unspecified cervical region: Secondary | ICD-10-CM | POA: Diagnosis not present

## 2024-07-20 DIAGNOSIS — Z6829 Body mass index (BMI) 29.0-29.9, adult: Secondary | ICD-10-CM | POA: Diagnosis not present

## 2024-07-20 DIAGNOSIS — E663 Overweight: Secondary | ICD-10-CM | POA: Diagnosis not present

## 2024-07-20 DIAGNOSIS — M0589 Other rheumatoid arthritis with rheumatoid factor of multiple sites: Secondary | ICD-10-CM | POA: Diagnosis not present

## 2024-07-20 DIAGNOSIS — M797 Fibromyalgia: Secondary | ICD-10-CM | POA: Diagnosis not present

## 2024-08-28 ENCOUNTER — Other Ambulatory Visit: Payer: Self-pay | Admitting: Internal Medicine

## 2024-08-30 ENCOUNTER — Other Ambulatory Visit: Payer: Self-pay

## 2024-09-06 ENCOUNTER — Encounter: Payer: Self-pay | Admitting: Gastroenterology

## 2024-09-10 ENCOUNTER — Emergency Department (HOSPITAL_BASED_OUTPATIENT_CLINIC_OR_DEPARTMENT_OTHER)

## 2024-09-10 ENCOUNTER — Emergency Department (HOSPITAL_BASED_OUTPATIENT_CLINIC_OR_DEPARTMENT_OTHER): Admission: EM | Admit: 2024-09-10 | Source: Ambulatory Visit

## 2024-09-10 ENCOUNTER — Encounter (HOSPITAL_BASED_OUTPATIENT_CLINIC_OR_DEPARTMENT_OTHER): Payer: Self-pay

## 2024-09-10 ENCOUNTER — Other Ambulatory Visit: Payer: Self-pay

## 2024-09-10 ENCOUNTER — Encounter: Payer: Self-pay | Admitting: Nurse Practitioner

## 2024-09-10 ENCOUNTER — Ambulatory Visit: Admitting: Nurse Practitioner

## 2024-09-10 VITALS — BP 148/102 | HR 84 | Temp 98.3°F | Ht 71.5 in | Wt 209.1 lb

## 2024-09-10 DIAGNOSIS — I10 Essential (primary) hypertension: Secondary | ICD-10-CM

## 2024-09-10 LAB — CBC WITH DIFFERENTIAL/PLATELET
Abs Immature Granulocytes: 0.03 10*3/uL (ref 0.00–0.07)
Basophils Absolute: 0.1 10*3/uL (ref 0.0–0.1)
Basophils Relative: 1 %
Eosinophils Absolute: 0.3 10*3/uL (ref 0.0–0.5)
Eosinophils Relative: 3 %
HCT: 40.5 % (ref 39.0–52.0)
Hemoglobin: 13.6 g/dL (ref 13.0–17.0)
Immature Granulocytes: 0 %
Lymphocytes Relative: 26 %
Lymphs Abs: 2.4 10*3/uL (ref 0.7–4.0)
MCH: 29.9 pg (ref 26.0–34.0)
MCHC: 33.6 g/dL (ref 30.0–36.0)
MCV: 89 fL (ref 80.0–100.0)
Monocytes Absolute: 0.9 10*3/uL (ref 0.1–1.0)
Monocytes Relative: 9 %
Neutro Abs: 5.5 10*3/uL (ref 1.7–7.7)
Neutrophils Relative %: 61 %
Platelets: 276 10*3/uL (ref 150–400)
RBC: 4.55 MIL/uL (ref 4.22–5.81)
RDW: 13.3 % (ref 11.5–15.5)
WBC: 9.1 10*3/uL (ref 4.0–10.5)
nRBC: 0 % (ref 0.0–0.2)

## 2024-09-10 LAB — BASIC METABOLIC PANEL WITH GFR
Anion gap: 14 (ref 5–15)
BUN: 16 mg/dL (ref 8–23)
CO2: 24 mmol/L (ref 22–32)
Calcium: 10.1 mg/dL (ref 8.9–10.3)
Chloride: 102 mmol/L (ref 98–111)
Creatinine, Ser: 1.39 mg/dL — ABNORMAL HIGH (ref 0.61–1.24)
GFR, Estimated: 54 mL/min — ABNORMAL LOW
Glucose, Bld: 109 mg/dL — ABNORMAL HIGH (ref 70–99)
Potassium: 4.1 mmol/L (ref 3.5–5.1)
Sodium: 140 mmol/L (ref 135–145)

## 2024-09-10 LAB — TROPONIN T, HIGH SENSITIVITY: Troponin T High Sensitivity: 21 ng/L — ABNORMAL HIGH (ref 0–19)

## 2024-09-10 MED ORDER — IOHEXOL 350 MG/ML SOLN
100.0000 mL | Freq: Once | INTRAVENOUS | Status: AC | PRN
  Administered 2024-09-10: 75 mL via INTRAVENOUS

## 2024-09-10 MED ORDER — AMLODIPINE BESYLATE 2.5 MG PO TABS: 2.5000 mg | ORAL_TABLET | Freq: Every day | ORAL | 0 refills | Status: AC

## 2024-09-10 MED ORDER — HYDRALAZINE HCL 10 MG PO TABS
10.0000 mg | ORAL_TABLET | Freq: Once | ORAL | Status: AC
  Administered 2024-09-10: 10 mg via ORAL
  Filled 2024-09-10: qty 1

## 2024-09-10 NOTE — Progress Notes (Signed)
 "  Established Patient Office Visit  Subjective   Patient ID: [name] Michael, male    DOB: 09/27/1950  Age: 74 y.o. MRN: [id number]  Chief Complaint  Patient presents with   Hypertension    Patient states his blood pressure has been reading high today, with readings of 145/103, 135/92, and 142/103. He reports that his blood pressure medication has not been helping. He denies chest pain but have headache, and throat discomfort.    Discussed the use of AI scribe software for clinical note transcription with the patient, who gave verbal consent to proceed.  History of Present Illness [name] Michael is a 74 year old male with hypertension and coronary artery disease who presents with elevated blood pressure and headaches.  Started experiencing headache on Tuesday this was coming and going. He decided to check his BP and noticed overall readings were higher than baseline (readings 130s-140s/90s-100s baseline 120s/70s). Additionally, has soreness/pain to the right side of his throat. Some mild nausea  Otherwise patient is feeling well. No chest pain, no SOB, no cough, no dizziness, no lightheadedness.   Pertinent PMHx: CAD, NSTEMI (2019), PE, HTN, TAA, AAA, mallory-weiss syndrome, migraine, PUD, GERD, fatty liver, OA, RA, neuromysitis.   He reports he has been able to exercise regularly as normal, no new medications.        ROS: see HPI    Objective:     BP (!) 148/102   Pulse 84   Temp 98.3 F (36.8 C) (Temporal)   Ht 5' 11.5 (1.816 m)   Wt 209 lb 2 oz (94.9 kg)   SpO2 97%   BMI 28.76 kg/m  BP Readings from Last 3 Encounters:  09/10/24 (!) 148/102  07/14/24 122/78  12/23/23 128/76   Wt Readings from Last 3 Encounters:  09/10/24 209 lb 2 oz (94.9 kg)  07/14/24 204 lb (92.5 kg)  06/03/24 205 lb (93 kg)      Physical Exam Vitals reviewed.  Constitutional:      Appearance: Normal appearance.  HENT:     Head: Normocephalic and atraumatic.  Neck:      Vascular: No carotid bruit.  Cardiovascular:     Rate and Rhythm: Normal rate and regular rhythm.     Pulses:          Carotid pulses are 3+ on the right side. Pulmonary:     Effort: Pulmonary effort is normal.     Breath sounds: Normal breath sounds.  Musculoskeletal:     Cervical back: Neck supple.  Skin:    General: Skin is warm and dry.  Neurological:     Mental Status: He is alert and oriented to person, place, and time.  Psychiatric:        Mood and Affect: Mood normal.        Behavior: Behavior normal.        Thought Content: Thought content normal.        Judgment: Judgment normal.    EKG: NSR  No results found for any visits on 09/10/24.    The ASCVD Risk score (Arnett DK, et al., 2019) failed to calculate for the following reasons:   Risk score cannot be calculated because patient has a medical history suggesting prior/existing ASCVD   * - Cholesterol units were assumed    Assessment & Plan:   Problem List Items Addressed This Visit       Cardiovascular and Mediastinum   Essential hypertension - Primary   Relevant Medications   amLODipine  (  NORVASC ) 2.5 MG tablet   Other Relevant Orders   EKG 12-Lead   ECHOCARDIOGRAM COMPLETE   US  Carotid Duplex Bilateral   Assessment and Plan Assessment & Plan Essential hypertension Blood pressure elevated since Wednesday with headache and neck discomfort. Current medications include losartan  and metoprolol . EKG unchanged.  - Concern regarding his throat pain and history of TAA and AAA with sudden onset of worsening hypertension. I feel patient likely needs emergent CTA to rule out rupture of aneurysm. I consulted by supervising physician and it was recommended that he proceed to the ER for evaluation.   If evaluation in ER is reassuring, he was recommended to add amlodipine  to antihypertensive regimen with f/u in 2 weeks for close monitoring. He was encouraged to check BP at home as well.   Patient aware of above  recommendations and is agreeable to proceeding to ER.    Return in about 2 weeks (around 09/24/2024) for F/U.    Lauraine FORBES Pereyra, NP  "

## 2024-09-10 NOTE — ED Provider Notes (Incomplete)
 " [place] [facility] AT [facility] Provider Note   CSN: [id number] Arrival date & time: 09/10/24  1831     Patient presents with: Hypertension   [name] Michael is a 74 y.o. male.  {Add pertinent medical, surgical, social history, OB history to [id number]} HPI       Tuesday started having headache, BP wa snot that bad Wednesday, Thursday, Friday, BP up and down Takes losartan  every day but not helping Keeps headache No chest pain or dyspnea Has some neck pain on the right side  Nausea comes and goes, no vomiting No diarrhea Denies numbness, weakness, difficulty talking or walking, visual changes or facial droop.   Has been exercising without issues, has headache but not worse with exercise This morning BP was high, had improved then 144/103,  headache worse Went to regular doctor to find out he was retired and saw lpn who sent him here No chest or back pain today  Highest bp was 168, 108 highest bottom  Headache probably 4-5/10 now   Past Medical History:  Diagnosis Date   ALLERGIC RHINITIS 04/30/2007   Allergy    mild   ANEMIA-NOS 07/12/2007   ANXIETY 02/17/2009   Blood transfusion without reported diagnosis    bleeding ulcers in 2008 or 2009   Cancer (HCC) 10/2019   lip cancer removed    Cataract    bilat removed    Clotting disorder    PE 2009- pt unaware of this finding    Coronary artery disease involving native coronary artery of native heart with unstable angina pectoris (HCC) 05/10/2011   DEPRESSIVE DISORDER 02/14/2009   Fibromyalgia    GERD (gastroesophageal reflux disease)    History of pulmonary embolism 03/09/2008   HYPERLIPIDEMIA 04/27/2007   HYPERTENSION 04/27/2007   INSOMNIA UNSPECIFIED 02/20/2010   Lupus    w/RA (08/20/2017)   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   Migraine    stopped in the early 2000s (08/20/2017)   Non-ST elevation (NSTEMI) myocardial infarction (HCC) -TYPE 4A peri-PCI 08/21/2017   Nonspecific  abnormal toxicological findings 03/14/2008   NSTEMI (non-ST elevated myocardial infarction) (HCC) 08/20/2017   OBESITY 04/30/2007   OSTEOARTHRITIS 04/30/2007   PEPTIC ULCER DISEASE 07/12/2007   Presence of drug coated stent in left circumflex coronary artery 08/21/2017   PROSTATITIS, HX OF 04/30/2007   RESTRICTIVE LUNG DISEASE 06/20/2009   Rheumatoid arthritis (HCC)    THORACIC AORTIC ANEURYSM 07/06/2009   Chest CT 11/12: ascending thoracic aortic aneurysm stable at 42 mm.    UNSPECIFIED MYALGIA AND MYOSITIS 02/14/2009    Past Surgical History:  Procedure Laterality Date   APPENDECTOMY     BACK SURGERY  2017   cervical discectomy C3-C4    CARDIAC CATHETERIZATION  06/2008   Non-obstructive CAD.  EF 40-45%. Anterior - apical HK   COLONOSCOPY  2010   CORONARY ANGIOPLASTY WITH STENT PLACEMENT  08/20/2017   CORONARY STENT INTERVENTION N/A 08/20/2017   Procedure: CORONARY STENT INTERVENTION;  Surgeon: Wonda Sharper, MD;  Location: [facility] INVASIVE CV LAB;  Service: Cardiovascular;  Laterality: N/A;   LEFT HEART CATH AND CORONARY ANGIOGRAPHY N/A 08/20/2017   Procedure: LEFT HEART CATH AND CORONARY ANGIOGRAPHY;  Surgeon: Wonda Sharper, MD;  Location: [facility] INVASIVE CV LAB;  Service: Cardiovascular;  Laterality: N/A;   NM MYOVIEW  LTD  06/2009   Abnormal stress nuclear study with small prior lateral infarct; no ischemia. Normal EF   POLYPECTOMY  04/14/2009   TA x 1   SURGERY OF LIP  10/2019  TRANSTHORACIC ECHOCARDIOGRAM  06/2009   55% to 60%. Wall motion was normal.  Mod Dilated [place].  Mild LA dilation.   UPPER GASTROINTESTINAL ENDOSCOPY      Prior to Admission medications  Medication Sig Start Date End Date Taking? Authorizing Provider  amLODipine  (NORVASC ) 2.5 MG tablet Take 1 tablet (2.5 mg total) by mouth daily. 09/10/24   Gray, Sarah E, NP  Ascorbic Acid (VITAMIN C PO) Take 1 tablet by mouth daily in the afternoon.    [provider]  aspirin  81 MG EC tablet Take 1 tablet (81  mg total) by mouth daily. 08/25/17   Durenda Alston SAUNDERS, FNP  B Complex-Folic Acid  (B COMPLEX PLUS) TABS Take 1 tablet by mouth daily. 02/13/21   Plotnikov, Aleksei V, MD  Cholecalciferol (VITAMIN D ) 50 MCG (2000 UT) CAPS Take 2,000 Units by mouth daily in the afternoon.    [provider]  clonazePAM  (KLONOPIN ) 1 MG tablet Take 1 tablet (1 mg total) by mouth 2 (two) times daily as needed. for anxiety 07/14/24   Norleen Lynwood ORN, MD  ezetimibe  (ZETIA ) 10 MG tablet Take 1 tablet (10 mg total) by mouth daily. 02/20/24   Pietro Redell RAMAN, MD  folic acid  (FOLVITE ) 800 MCG tablet Take 400 mcg by mouth daily.    [provider]  hydroxychloroquine  (PLAQUENIL ) 200 MG tablet Take 200 mg by mouth 2 (two) times daily.    [provider]  hyoscyamine  (LEVSIN  SL) 0.125 MG SL tablet Place 1 tablet (0.125 mg total) under the tongue every 4 (four) hours as needed. 10/08/23   Armbruster, Elspeth SQUIBB, MD  leflunomide (ARAVA) 20 MG tablet Take 20 mg by mouth daily.      [provider]  losartan  (COZAAR ) 100 MG tablet Take 1 tablet (100 mg total) by mouth daily. 04/19/24 09/10/24  Pietro Redell RAMAN, MD  metoprolol  tartrate (LOPRESSOR ) 25 MG tablet TAKE ONE TABLET BY MOUTH TWICE DAILY 07/19/24   Norleen Lynwood ORN, MD  mupirocin  ointment (BACTROBAN ) 2 % Apply 1 Application topically 2 (two) times daily. 07/14/23   Norleen Lynwood ORN, MD  Omega-3 Fatty Acids (FISH OIL) 1200 MG CAPS Take 1,200 mg by mouth daily in the afternoon.    [provider]  omeprazole  (PRILOSEC) 20 MG capsule Take 1 capsule (20 mg total) by mouth daily. 08/30/24   Norleen Lynwood ORN, MD  predniSONE  (DELTASONE ) 5 MG tablet prednisone  5 mg, 30 mg p.o. daily first day, then decrease by 5 mg every other day for 12 days 06/30/24   Christine Norleen, DPM  sildenafil  (VIAGRA ) 100 MG tablet TAKE ONE TABLET BY MOUTH DAILY AS NEEDED 07/19/24   Norleen Lynwood ORN, MD  sulfamethoxazole -trimethoprim  (BACTRIM  DS) 800-160 MG tablet Take 1 tablet by mouth  2 (two) times daily. 07/14/23   Norleen Lynwood ORN, MD  Turmeric 500 MG CAPS Take 500 mg by mouth daily in the afternoon.    [provider]  zolpidem  (AMBIEN ) 10 MG tablet Take 1 tablet (10 mg total) by mouth at bedtime as needed for sleep. 07/14/24   Norleen Lynwood ORN, MD    Allergies: Atorvastatin, Ciprofloxacin, Crestor  [rosuvastatin ], and Doxycycline     Review of Systems  Updated Vital Signs BP (!) 137/111   Pulse 86   Temp 98 F (36.7 C)   Resp 18   Ht 5' 11 (1.803 m)   Wt 94.8 kg   SpO2 96%   BMI 29.15 kg/m   Physical Exam  (all labs ordered are  listed, but only abnormal results are displayed) Labs Reviewed  BASIC METABOLIC PANEL WITH GFR - Abnormal; Notable for the following components:      Result Value   Glucose, Bld 109 (*)    Creatinine, Ser 1.39 (*)    GFR, Estimated 54 (*)    All other components within normal limits  CBC WITH DIFFERENTIAL/PLATELET    EKG: None  Radiology: No results found.  {Document cardiac monitor, telemetry assessment procedure when appropriate:32947} Procedures   Medications Ordered in the ED  hydrALAZINE  (APRESOLINE ) tablet 10 mg (10 mg Oral Given 09/10/24 2201)      {Click here for ABCD2, HEART and other calculators REFRESH Note before signing:1}                              Medical Decision Making Amount and/or Complexity of Data Reviewed Labs: ordered. Radiology: ordered.  Risk Prescription drug management.   ***  {Document critical care time when appropriate  Document review of labs and clinical decision tools ie CHADS2VASC2, etc  Document your independent review of radiology images and any outside records  Document your discussion with family members, caretakers and with consultants  Document social determinants of health affecting pt's care  Document your decision making why or why not admission, treatments were needed:32947:::1}   Final diagnoses:  None    ED Discharge Orders     None        "

## 2024-09-10 NOTE — ED Triage Notes (Signed)
 Pt reports HA x4 days ago and elevated BP. Pt reports seeing PCP and was advised to come to ED for CT due to hx of aneurysm.

## 2024-09-11 NOTE — ED Provider Notes (Incomplete)
 Received patient in turnover from Dr. Dreama.  Please see their note for further details of Hx, PE.  Briefly patient is a 74 y.o. male with a Hypertension .  Patient sent from PCP for evaluation.  Patient having headache and neck pain.  Awaiting CT angio head and neck and CT venogram. Delta trop.

## 2024-09-21 ENCOUNTER — Ambulatory Visit: Admitting: Family Medicine

## 2025-07-15 ENCOUNTER — Ambulatory Visit: Admitting: Nurse Practitioner
